# Patient Record
Sex: Female | Born: 1935
Health system: Southern US, Community
[De-identification: ages and names within clinical notes are randomized; demographics above are authoritative.]

## PROBLEM LIST (undated history)

## (undated) DIAGNOSIS — E039 Hypothyroidism, unspecified: Secondary | ICD-10-CM

## (undated) DIAGNOSIS — I10 Essential (primary) hypertension: Secondary | ICD-10-CM

## (undated) DIAGNOSIS — R7989 Other specified abnormal findings of blood chemistry: Secondary | ICD-10-CM

## (undated) DIAGNOSIS — M51369 Other intervertebral disc degeneration, lumbar region without mention of lumbar back pain or lower extremity pain: Secondary | ICD-10-CM

## (undated) DIAGNOSIS — J45909 Unspecified asthma, uncomplicated: Secondary | ICD-10-CM

## (undated) DIAGNOSIS — R079 Chest pain, unspecified: Secondary | ICD-10-CM

## (undated) DIAGNOSIS — M4800 Spinal stenosis, site unspecified: Secondary | ICD-10-CM

## (undated) DIAGNOSIS — M51379 Other intervertebral disc degeneration, lumbosacral region without mention of lumbar back pain or lower extremity pain: Secondary | ICD-10-CM

## (undated) DIAGNOSIS — R0600 Dyspnea, unspecified: Secondary | ICD-10-CM

## (undated) DIAGNOSIS — M1990 Unspecified osteoarthritis, unspecified site: Secondary | ICD-10-CM

## (undated) DIAGNOSIS — K760 Fatty (change of) liver, not elsewhere classified: Secondary | ICD-10-CM

## (undated) DIAGNOSIS — N644 Mastodynia: Secondary | ICD-10-CM

## (undated) DIAGNOSIS — R945 Abnormal results of liver function studies: Secondary | ICD-10-CM

## (undated) DIAGNOSIS — E756 Lipid storage disorder, unspecified: Secondary | ICD-10-CM

## (undated) DIAGNOSIS — M5137 Other intervertebral disc degeneration, lumbosacral region: Secondary | ICD-10-CM

## (undated) DIAGNOSIS — M5136 Other intervertebral disc degeneration, lumbar region: Secondary | ICD-10-CM

## (undated) DIAGNOSIS — Z86010 Personal history of colonic polyps: Secondary | ICD-10-CM

## (undated) DIAGNOSIS — R011 Cardiac murmur, unspecified: Secondary | ICD-10-CM

## (undated) DIAGNOSIS — K219 Gastro-esophageal reflux disease without esophagitis: Secondary | ICD-10-CM

## (undated) DIAGNOSIS — E8809 Other disorders of plasma-protein metabolism, not elsewhere classified: Secondary | ICD-10-CM

## (undated) DIAGNOSIS — E785 Hyperlipidemia, unspecified: Secondary | ICD-10-CM

## (undated) DIAGNOSIS — M8580 Other specified disorders of bone density and structure, unspecified site: Principal | ICD-10-CM

## (undated) HISTORY — DX: Mastodynia: N64.4

## (undated) HISTORY — DX: Other specified disorders of bone density and structure, unspecified site: M85.80

## (undated) HISTORY — PX: APPENDECTOMY: SHX54

## (undated) HISTORY — DX: Abnormal results of liver function studies: R94.5

## (undated) HISTORY — DX: Fatty (change of) liver, not elsewhere classified: K76.0

## (undated) HISTORY — DX: Unspecified asthma, uncomplicated: J45.909

## (undated) HISTORY — DX: Essential (primary) hypertension: I10

## (undated) HISTORY — PX: TONSILLECTOMY: SUR1361

## (undated) HISTORY — DX: Personal history of colonic polyps: Z86.010

## (undated) HISTORY — DX: Chest pain, unspecified: R07.9

## (undated) HISTORY — DX: Hypothyroidism, unspecified: E03.9

## (undated) HISTORY — DX: Other specified abnormal findings of blood chemistry: R79.89

## (undated) HISTORY — DX: Gastro-esophageal reflux disease without esophagitis: K21.9

## (undated) HISTORY — DX: Hyperlipidemia, unspecified: E78.5

## (undated) HISTORY — DX: Dyspnea, unspecified: R06.00

## (undated) HISTORY — DX: Unspecified osteoarthritis, unspecified site: M19.90

---

## 1966-05-12 HISTORY — PX: UTERINE SUSPENSION: SUR1430

## 1971-05-13 HISTORY — PX: CHOLECYSTECTOMY: SHX55

## 1998-02-15 ENCOUNTER — Ambulatory Visit (HOSPITAL_COMMUNITY): Admission: RE | Admit: 1998-02-15 | Discharge: 1998-02-15 | Payer: Self-pay | Admitting: Cardiology

## 1999-02-05 ENCOUNTER — Other Ambulatory Visit: Admission: RE | Admit: 1999-02-05 | Discharge: 1999-02-05 | Payer: Self-pay | Admitting: Gynecology

## 1999-09-11 ENCOUNTER — Ambulatory Visit (HOSPITAL_BASED_OUTPATIENT_CLINIC_OR_DEPARTMENT_OTHER): Admission: RE | Admit: 1999-09-11 | Discharge: 1999-09-11 | Payer: Self-pay | Admitting: Orthopedic Surgery

## 1999-09-11 ENCOUNTER — Encounter (INDEPENDENT_AMBULATORY_CARE_PROVIDER_SITE_OTHER): Payer: Self-pay | Admitting: *Deleted

## 1999-09-16 ENCOUNTER — Ambulatory Visit (HOSPITAL_COMMUNITY): Admission: RE | Admit: 1999-09-16 | Discharge: 1999-09-16 | Payer: Self-pay | Admitting: Orthopedic Surgery

## 1999-10-09 ENCOUNTER — Encounter: Payer: Self-pay | Admitting: Orthopedic Surgery

## 1999-10-09 ENCOUNTER — Ambulatory Visit (HOSPITAL_COMMUNITY): Admission: RE | Admit: 1999-10-09 | Discharge: 1999-10-09 | Payer: Self-pay | Admitting: Orthopedic Surgery

## 1999-10-21 ENCOUNTER — Encounter: Admission: RE | Admit: 1999-10-21 | Discharge: 1999-10-21 | Payer: Self-pay | Admitting: Internal Medicine

## 1999-10-30 ENCOUNTER — Encounter (INDEPENDENT_AMBULATORY_CARE_PROVIDER_SITE_OTHER): Payer: Self-pay | Admitting: *Deleted

## 1999-10-30 ENCOUNTER — Ambulatory Visit (HOSPITAL_BASED_OUTPATIENT_CLINIC_OR_DEPARTMENT_OTHER): Admission: RE | Admit: 1999-10-30 | Discharge: 1999-10-30 | Payer: Self-pay | Admitting: Orthopedic Surgery

## 1999-12-09 ENCOUNTER — Encounter: Admission: RE | Admit: 1999-12-09 | Discharge: 1999-12-09 | Payer: Self-pay | Admitting: Internal Medicine

## 2000-01-20 ENCOUNTER — Encounter: Admission: RE | Admit: 2000-01-20 | Discharge: 2000-01-20 | Payer: Self-pay | Admitting: Internal Medicine

## 2000-02-26 ENCOUNTER — Encounter: Admission: RE | Admit: 2000-02-26 | Discharge: 2000-02-26 | Payer: Self-pay | Admitting: Internal Medicine

## 2000-04-17 ENCOUNTER — Encounter: Admission: RE | Admit: 2000-04-17 | Discharge: 2000-04-17 | Payer: Self-pay | Admitting: Internal Medicine

## 2000-04-23 ENCOUNTER — Other Ambulatory Visit: Admission: RE | Admit: 2000-04-23 | Discharge: 2000-04-23 | Payer: Self-pay | Admitting: Gynecology

## 2000-04-24 ENCOUNTER — Encounter: Admission: RE | Admit: 2000-04-24 | Discharge: 2000-04-24 | Payer: Self-pay | Admitting: Internal Medicine

## 2000-06-01 ENCOUNTER — Other Ambulatory Visit: Admission: RE | Admit: 2000-06-01 | Discharge: 2000-06-01 | Payer: Self-pay | Admitting: Gynecology

## 2000-07-14 ENCOUNTER — Encounter: Admission: RE | Admit: 2000-07-14 | Discharge: 2000-07-14 | Payer: Self-pay | Admitting: Internal Medicine

## 2001-05-12 DIAGNOSIS — J45909 Unspecified asthma, uncomplicated: Secondary | ICD-10-CM

## 2001-05-12 HISTORY — DX: Unspecified asthma, uncomplicated: J45.909

## 2001-06-30 ENCOUNTER — Other Ambulatory Visit: Admission: RE | Admit: 2001-06-30 | Discharge: 2001-06-30 | Payer: Self-pay | Admitting: Gynecology

## 2001-12-31 ENCOUNTER — Encounter: Admission: RE | Admit: 2001-12-31 | Discharge: 2001-12-31 | Payer: Self-pay | Admitting: Internal Medicine

## 2001-12-31 ENCOUNTER — Encounter: Payer: Self-pay | Admitting: Internal Medicine

## 2002-01-12 ENCOUNTER — Encounter: Payer: Self-pay | Admitting: Internal Medicine

## 2002-01-12 ENCOUNTER — Ambulatory Visit (HOSPITAL_COMMUNITY): Admission: RE | Admit: 2002-01-12 | Discharge: 2002-01-12 | Payer: Self-pay | Admitting: Internal Medicine

## 2003-07-31 ENCOUNTER — Other Ambulatory Visit: Admission: RE | Admit: 2003-07-31 | Discharge: 2003-07-31 | Payer: Self-pay | Admitting: Gynecology

## 2003-08-02 ENCOUNTER — Encounter: Admission: RE | Admit: 2003-08-02 | Discharge: 2003-08-02 | Payer: Self-pay | Admitting: Gynecology

## 2003-09-14 ENCOUNTER — Ambulatory Visit (HOSPITAL_BASED_OUTPATIENT_CLINIC_OR_DEPARTMENT_OTHER): Admission: RE | Admit: 2003-09-14 | Discharge: 2003-09-14 | Payer: Self-pay | Admitting: Gynecology

## 2003-09-14 ENCOUNTER — Ambulatory Visit (HOSPITAL_COMMUNITY): Admission: RE | Admit: 2003-09-14 | Discharge: 2003-09-14 | Payer: Self-pay | Admitting: Gynecology

## 2003-09-14 ENCOUNTER — Encounter (INDEPENDENT_AMBULATORY_CARE_PROVIDER_SITE_OTHER): Payer: Self-pay | Admitting: *Deleted

## 2004-03-19 ENCOUNTER — Ambulatory Visit: Payer: Self-pay | Admitting: Internal Medicine

## 2004-07-29 ENCOUNTER — Ambulatory Visit: Payer: Self-pay | Admitting: Internal Medicine

## 2004-08-22 ENCOUNTER — Ambulatory Visit: Payer: Self-pay | Admitting: Internal Medicine

## 2004-08-29 ENCOUNTER — Ambulatory Visit: Payer: Self-pay | Admitting: Internal Medicine

## 2004-09-23 ENCOUNTER — Ambulatory Visit: Payer: Self-pay | Admitting: Internal Medicine

## 2004-12-10 ENCOUNTER — Other Ambulatory Visit: Admission: RE | Admit: 2004-12-10 | Discharge: 2004-12-10 | Payer: Self-pay | Admitting: Gynecology

## 2004-12-17 ENCOUNTER — Encounter: Admission: RE | Admit: 2004-12-17 | Discharge: 2004-12-17 | Payer: Self-pay | Admitting: Gynecology

## 2005-09-10 ENCOUNTER — Encounter: Payer: Self-pay | Admitting: Cardiology

## 2005-11-14 ENCOUNTER — Ambulatory Visit: Payer: Self-pay | Admitting: Internal Medicine

## 2005-11-24 ENCOUNTER — Ambulatory Visit: Payer: Self-pay | Admitting: Internal Medicine

## 2005-12-30 ENCOUNTER — Other Ambulatory Visit: Admission: RE | Admit: 2005-12-30 | Discharge: 2005-12-30 | Payer: Self-pay | Admitting: Gynecology

## 2005-12-31 ENCOUNTER — Encounter: Admission: RE | Admit: 2005-12-31 | Discharge: 2005-12-31 | Payer: Self-pay | Admitting: Gynecology

## 2006-07-21 ENCOUNTER — Ambulatory Visit: Payer: Self-pay | Admitting: Gastroenterology

## 2006-07-21 LAB — CONVERTED CEMR LAB
Eosinophils Relative: 3.9 % (ref 0.0–5.0)
HCT: 42.2 % (ref 36.0–46.0)
Hemoglobin: 14.4 g/dL (ref 12.0–15.0)
Lymphocytes Relative: 24.1 % (ref 12.0–46.0)
Monocytes Relative: 9.2 % (ref 3.0–11.0)
Platelets: 244 10*3/uL (ref 150–400)
WBC: 7.2 10*3/uL (ref 4.5–10.5)

## 2006-08-10 ENCOUNTER — Ambulatory Visit: Payer: Self-pay | Admitting: Internal Medicine

## 2006-08-18 ENCOUNTER — Ambulatory Visit: Payer: Self-pay | Admitting: Gastroenterology

## 2006-08-18 LAB — CONVERTED CEMR LAB
BUN: 17 mg/dL (ref 6–23)
CO2: 29 meq/L (ref 19–32)
Chloride: 108 meq/L (ref 96–112)
Creatinine, Ser: 0.8 mg/dL (ref 0.4–1.2)
GFR calc non Af Amer: 74 mL/min
Glucose, Bld: 88 mg/dL (ref 70–99)
T4, Total: 8.4 ug/dL (ref 5.0–12.5)

## 2006-08-21 ENCOUNTER — Encounter (INDEPENDENT_AMBULATORY_CARE_PROVIDER_SITE_OTHER): Payer: Self-pay | Admitting: Specialist

## 2006-08-21 ENCOUNTER — Ambulatory Visit: Payer: Self-pay | Admitting: Gastroenterology

## 2006-08-21 DIAGNOSIS — Z860100 Personal history of colon polyps, unspecified: Secondary | ICD-10-CM

## 2006-08-21 HISTORY — DX: Personal history of colon polyps, unspecified: Z86.0100

## 2006-08-21 HISTORY — DX: Personal history of colonic polyps: Z86.010

## 2006-12-30 ENCOUNTER — Telehealth (INDEPENDENT_AMBULATORY_CARE_PROVIDER_SITE_OTHER): Payer: Self-pay | Admitting: *Deleted

## 2007-01-05 ENCOUNTER — Ambulatory Visit: Payer: Self-pay | Admitting: Internal Medicine

## 2007-01-11 LAB — CONVERTED CEMR LAB
ALT: 18 units/L (ref 0–35)
AST: 21 units/L (ref 0–37)
Direct LDL: 159.8 mg/dL
HDL: 54 mg/dL (ref >39.0)
TSH: 0.26 microintl units/mL — ABNORMAL LOW (ref 0.35–5.50)
Total CHOL/HDL Ratio: 4.2
Triglycerides: 137 mg/dL (ref 0–149)
VLDL: 27 mg/dL (ref 0–40)

## 2007-01-12 ENCOUNTER — Encounter (INDEPENDENT_AMBULATORY_CARE_PROVIDER_SITE_OTHER): Payer: Self-pay | Admitting: *Deleted

## 2007-01-15 ENCOUNTER — Ambulatory Visit: Payer: Self-pay | Admitting: Internal Medicine

## 2007-01-15 DIAGNOSIS — R252 Cramp and spasm: Secondary | ICD-10-CM

## 2007-01-15 DIAGNOSIS — E039 Hypothyroidism, unspecified: Secondary | ICD-10-CM

## 2007-01-15 LAB — CONVERTED CEMR LAB: LDL Goal: 160 mg/dL

## 2007-03-16 ENCOUNTER — Encounter: Admission: RE | Admit: 2007-03-16 | Discharge: 2007-03-16 | Payer: Self-pay | Admitting: Gynecology

## 2007-03-18 ENCOUNTER — Telehealth (INDEPENDENT_AMBULATORY_CARE_PROVIDER_SITE_OTHER): Payer: Self-pay | Admitting: *Deleted

## 2007-05-20 ENCOUNTER — Telehealth (INDEPENDENT_AMBULATORY_CARE_PROVIDER_SITE_OTHER): Payer: Self-pay | Admitting: *Deleted

## 2007-05-26 ENCOUNTER — Telehealth (INDEPENDENT_AMBULATORY_CARE_PROVIDER_SITE_OTHER): Payer: Self-pay | Admitting: *Deleted

## 2007-10-07 ENCOUNTER — Telehealth (INDEPENDENT_AMBULATORY_CARE_PROVIDER_SITE_OTHER): Payer: Self-pay | Admitting: *Deleted

## 2007-10-07 ENCOUNTER — Ambulatory Visit: Payer: Self-pay | Admitting: Family Medicine

## 2007-10-07 DIAGNOSIS — J45909 Unspecified asthma, uncomplicated: Secondary | ICD-10-CM | POA: Insufficient documentation

## 2007-10-07 DIAGNOSIS — R609 Edema, unspecified: Secondary | ICD-10-CM | POA: Insufficient documentation

## 2007-10-07 LAB — CONVERTED CEMR LAB
Blood in Urine, dipstick: NEGATIVE
Nitrite: NEGATIVE
Specific Gravity, Urine: 1.02
pH: 5

## 2007-10-08 ENCOUNTER — Encounter: Payer: Self-pay | Admitting: Family Medicine

## 2007-10-12 ENCOUNTER — Ambulatory Visit: Payer: Self-pay | Admitting: Internal Medicine

## 2007-10-12 ENCOUNTER — Telehealth (INDEPENDENT_AMBULATORY_CARE_PROVIDER_SITE_OTHER): Payer: Self-pay | Admitting: *Deleted

## 2007-10-12 DIAGNOSIS — T887XXA Unspecified adverse effect of drug or medicament, initial encounter: Secondary | ICD-10-CM

## 2007-10-12 DIAGNOSIS — K219 Gastro-esophageal reflux disease without esophagitis: Secondary | ICD-10-CM | POA: Insufficient documentation

## 2007-10-12 LAB — CONVERTED CEMR LAB
Bilirubin Urine: NEGATIVE
Protein, U semiquant: NEGATIVE
Specific Gravity, Urine: 1.01
Urobilinogen, UA: 0.2

## 2007-10-14 ENCOUNTER — Telehealth (INDEPENDENT_AMBULATORY_CARE_PROVIDER_SITE_OTHER): Payer: Self-pay | Admitting: *Deleted

## 2008-02-09 ENCOUNTER — Ambulatory Visit: Payer: Self-pay | Admitting: Internal Medicine

## 2008-02-24 ENCOUNTER — Ambulatory Visit: Payer: Self-pay | Admitting: Gynecology

## 2008-02-29 ENCOUNTER — Telehealth (INDEPENDENT_AMBULATORY_CARE_PROVIDER_SITE_OTHER): Payer: Self-pay | Admitting: *Deleted

## 2008-03-13 ENCOUNTER — Telehealth (INDEPENDENT_AMBULATORY_CARE_PROVIDER_SITE_OTHER): Payer: Self-pay | Admitting: *Deleted

## 2008-04-27 ENCOUNTER — Encounter: Admission: RE | Admit: 2008-04-27 | Discharge: 2008-04-27 | Payer: Self-pay | Admitting: Internal Medicine

## 2008-09-08 ENCOUNTER — Encounter: Payer: Self-pay | Admitting: Internal Medicine

## 2008-09-26 ENCOUNTER — Encounter: Payer: Self-pay | Admitting: Internal Medicine

## 2008-09-29 ENCOUNTER — Ambulatory Visit: Payer: Self-pay | Admitting: Family Medicine

## 2008-09-29 ENCOUNTER — Encounter: Payer: Self-pay | Admitting: Internal Medicine

## 2008-09-29 LAB — CONVERTED CEMR LAB: RBC / HPF: NONE SEEN (ref <3)

## 2008-09-30 ENCOUNTER — Encounter: Payer: Self-pay | Admitting: Family Medicine

## 2008-10-02 ENCOUNTER — Telehealth (INDEPENDENT_AMBULATORY_CARE_PROVIDER_SITE_OTHER): Payer: Self-pay | Admitting: *Deleted

## 2008-10-03 ENCOUNTER — Telehealth (INDEPENDENT_AMBULATORY_CARE_PROVIDER_SITE_OTHER): Payer: Self-pay | Admitting: *Deleted

## 2008-10-05 ENCOUNTER — Encounter: Payer: Self-pay | Admitting: Internal Medicine

## 2008-10-30 ENCOUNTER — Encounter: Payer: Self-pay | Admitting: Gastroenterology

## 2009-01-04 ENCOUNTER — Ambulatory Visit: Payer: Self-pay | Admitting: Internal Medicine

## 2009-01-04 LAB — CONVERTED CEMR LAB
ALT: 16 units/L (ref 0–35)
AST: 21 units/L (ref 0–37)
Albumin: 3.7 g/dL (ref 3.5–5.2)
BUN: 16 mg/dL (ref 6–23)
Basophils Relative: 0.2 % (ref 0.0–3.0)
Bilirubin Urine: NEGATIVE
Blood in Urine, dipstick: NEGATIVE
Chloride: 106 meq/L (ref 96–112)
Glucose, Urine, Semiquant: NEGATIVE
HCT: 41.4 % (ref 36.0–46.0)
HDL: 56.1 mg/dL (ref >39.00)
Hemoglobin: 14.1 g/dL (ref 12.0–15.0)
LDL Cholesterol: 113 mg/dL — ABNORMAL HIGH (ref 0–99)
Lymphocytes Relative: 27.5 % (ref 12.0–46.0)
MCHC: 34.1 g/dL (ref 30.0–36.0)
Nitrite: NEGATIVE
Platelets: 186 10*3/uL (ref 150.0–400.0)
Potassium: 4.4 meq/L (ref 3.5–5.1)
Protein, U semiquant: NEGATIVE
TSH: 0.67 microintl units/mL (ref 0.35–5.50)
Urobilinogen, UA: 0.2
pH: 5

## 2009-01-05 ENCOUNTER — Encounter: Payer: Self-pay | Admitting: Internal Medicine

## 2009-01-11 ENCOUNTER — Ambulatory Visit: Payer: Self-pay | Admitting: Internal Medicine

## 2009-01-11 LAB — CONVERTED CEMR LAB
OCCULT 1: NEGATIVE
OCCULT 2: NEGATIVE

## 2009-01-12 ENCOUNTER — Ambulatory Visit: Payer: Self-pay | Admitting: Internal Medicine

## 2009-01-12 DIAGNOSIS — I10 Essential (primary) hypertension: Secondary | ICD-10-CM

## 2009-01-12 DIAGNOSIS — E782 Mixed hyperlipidemia: Secondary | ICD-10-CM

## 2009-01-12 DIAGNOSIS — Z86010 Personal history of colonic polyps: Secondary | ICD-10-CM

## 2009-01-12 DIAGNOSIS — Z85828 Personal history of other malignant neoplasm of skin: Secondary | ICD-10-CM

## 2009-01-16 ENCOUNTER — Telehealth (INDEPENDENT_AMBULATORY_CARE_PROVIDER_SITE_OTHER): Payer: Self-pay | Admitting: *Deleted

## 2009-05-17 ENCOUNTER — Telehealth: Payer: Self-pay | Admitting: Internal Medicine

## 2009-05-17 DIAGNOSIS — N644 Mastodynia: Secondary | ICD-10-CM

## 2009-05-21 ENCOUNTER — Encounter: Admission: RE | Admit: 2009-05-21 | Discharge: 2009-05-21 | Payer: Self-pay | Admitting: Internal Medicine

## 2009-07-19 ENCOUNTER — Encounter (INDEPENDENT_AMBULATORY_CARE_PROVIDER_SITE_OTHER): Payer: Self-pay | Admitting: *Deleted

## 2009-11-30 ENCOUNTER — Encounter: Payer: Self-pay | Admitting: Internal Medicine

## 2009-12-07 ENCOUNTER — Ambulatory Visit: Payer: Self-pay | Admitting: Internal Medicine

## 2009-12-07 LAB — CONVERTED CEMR LAB
Ketones, urine, test strip: NEGATIVE
Specific Gravity, Urine: 1.02

## 2009-12-08 ENCOUNTER — Encounter: Payer: Self-pay | Admitting: Internal Medicine

## 2009-12-11 ENCOUNTER — Encounter: Payer: Self-pay | Admitting: Internal Medicine

## 2009-12-11 ENCOUNTER — Telehealth (INDEPENDENT_AMBULATORY_CARE_PROVIDER_SITE_OTHER): Payer: Self-pay | Admitting: *Deleted

## 2009-12-11 LAB — CONVERTED CEMR LAB
ALT: 18 units/L (ref 0–35)
Eosinophils Absolute: 0.4 10*3/uL (ref 0.0–0.7)
HCT: 43.3 % (ref 36.0–46.0)
MCHC: 34.6 g/dL (ref 30.0–36.0)
MCV: 94.5 fL (ref 78.0–100.0)
Monocytes Absolute: 0.9 10*3/uL (ref 0.1–1.0)
Neutro Abs: 5.1 10*3/uL (ref 1.4–7.7)
Neutrophils Relative %: 57.6 % (ref 43.0–77.0)
RBC: 4.58 M/uL (ref 3.87–5.11)
RDW: 14.2 % (ref 11.5–14.6)
Total Bilirubin: 0.4 mg/dL (ref 0.3–1.2)
WBC: 8.9 10*3/uL (ref 4.5–10.5)

## 2009-12-12 ENCOUNTER — Ambulatory Visit: Payer: Self-pay | Admitting: Internal Medicine

## 2009-12-12 DIAGNOSIS — R748 Abnormal levels of other serum enzymes: Secondary | ICD-10-CM | POA: Insufficient documentation

## 2009-12-14 LAB — CONVERTED CEMR LAB: Amylase: 220 units/L — ABNORMAL HIGH (ref 27–131)

## 2009-12-18 ENCOUNTER — Encounter: Admission: RE | Admit: 2009-12-18 | Discharge: 2009-12-18 | Payer: Self-pay | Admitting: Internal Medicine

## 2009-12-20 ENCOUNTER — Ambulatory Visit: Payer: Self-pay | Admitting: Internal Medicine

## 2009-12-20 ENCOUNTER — Encounter (INDEPENDENT_AMBULATORY_CARE_PROVIDER_SITE_OTHER): Payer: Self-pay | Admitting: *Deleted

## 2010-04-10 ENCOUNTER — Ambulatory Visit: Payer: Self-pay | Admitting: Internal Medicine

## 2010-04-10 DIAGNOSIS — R350 Frequency of micturition: Secondary | ICD-10-CM

## 2010-04-10 DIAGNOSIS — M546 Pain in thoracic spine: Secondary | ICD-10-CM

## 2010-04-10 LAB — CONVERTED CEMR LAB
Glucose, Urine, Semiquant: NEGATIVE
Protein, U semiquant: NEGATIVE
Specific Gravity, Urine: 1.015
WBC Urine, dipstick: NEGATIVE

## 2010-04-11 ENCOUNTER — Ambulatory Visit: Payer: Self-pay | Admitting: Internal Medicine

## 2010-04-15 ENCOUNTER — Telehealth: Payer: Self-pay | Admitting: Internal Medicine

## 2010-05-16 ENCOUNTER — Ambulatory Visit
Admission: RE | Admit: 2010-05-16 | Discharge: 2010-05-16 | Payer: Self-pay | Source: Home / Self Care | Attending: Internal Medicine | Admitting: Internal Medicine

## 2010-05-16 ENCOUNTER — Encounter: Payer: Self-pay | Admitting: Internal Medicine

## 2010-05-16 LAB — CONVERTED CEMR LAB
Bilirubin Urine: NEGATIVE
Blood in Urine, dipstick: NEGATIVE
Nitrite: NEGATIVE
Specific Gravity, Urine: <1.005
Urobilinogen, UA: 0.2
pH: 6

## 2010-05-17 ENCOUNTER — Encounter: Payer: Self-pay | Admitting: Internal Medicine

## 2010-05-19 DIAGNOSIS — M949 Disorder of cartilage, unspecified: Secondary | ICD-10-CM

## 2010-05-19 DIAGNOSIS — M899 Disorder of bone, unspecified: Secondary | ICD-10-CM | POA: Insufficient documentation

## 2010-05-20 ENCOUNTER — Other Ambulatory Visit: Payer: Self-pay | Admitting: Internal Medicine

## 2010-05-20 ENCOUNTER — Encounter: Payer: Self-pay | Admitting: Internal Medicine

## 2010-05-20 ENCOUNTER — Telehealth (INDEPENDENT_AMBULATORY_CARE_PROVIDER_SITE_OTHER): Payer: Self-pay | Admitting: *Deleted

## 2010-05-20 ENCOUNTER — Ambulatory Visit
Admission: RE | Admit: 2010-05-20 | Discharge: 2010-05-20 | Payer: Self-pay | Source: Home / Self Care | Attending: Internal Medicine | Admitting: Internal Medicine

## 2010-05-20 DIAGNOSIS — R519 Headache, unspecified: Secondary | ICD-10-CM | POA: Insufficient documentation

## 2010-05-20 DIAGNOSIS — R6883 Chills (without fever): Secondary | ICD-10-CM | POA: Insufficient documentation

## 2010-05-20 DIAGNOSIS — R51 Headache: Secondary | ICD-10-CM | POA: Insufficient documentation

## 2010-05-21 LAB — CBC WITH DIFFERENTIAL/PLATELET
Basophils Absolute: 0 10*3/uL (ref 0.0–0.1)
Basophils Relative: 0.5 % (ref 0.0–3.0)
Eosinophils Absolute: 0.5 10*3/uL (ref 0.0–0.7)
Eosinophils Relative: 5.7 % — ABNORMAL HIGH (ref 0.0–5.0)
HCT: 43.5 % (ref 36.0–46.0)
Hemoglobin: 14.5 g/dL (ref 12.0–15.0)
Lymphocytes Relative: 21.6 % (ref 12.0–46.0)
Lymphs Abs: 2 10*3/uL (ref 0.7–4.0)
MCHC: 33.3 g/dL (ref 30.0–36.0)
MCV: 96.8 fl (ref 78.0–100.0)
Monocytes Absolute: 0.5 10*3/uL (ref 0.1–1.0)
Monocytes Relative: 6.1 % (ref 3.0–12.0)
Neutro Abs: 6 10*3/uL (ref 1.4–7.7)
Neutrophils Relative %: 66.1 % (ref 43.0–77.0)
Platelets: 240 10*3/uL (ref 150.0–400.0)
RBC: 4.49 Mil/uL (ref 3.87–5.11)
RDW: 13.9 % (ref 11.5–14.6)
WBC: 9 10*3/uL (ref 4.5–10.5)

## 2010-05-22 ENCOUNTER — Ambulatory Visit
Admission: RE | Admit: 2010-05-22 | Discharge: 2010-05-22 | Payer: Self-pay | Source: Home / Self Care | Attending: Women's Health | Admitting: Women's Health

## 2010-05-22 ENCOUNTER — Other Ambulatory Visit
Admission: RE | Admit: 2010-05-22 | Discharge: 2010-05-22 | Payer: Self-pay | Source: Home / Self Care | Admitting: Gynecology

## 2010-05-28 ENCOUNTER — Ambulatory Visit
Admission: RE | Admit: 2010-05-28 | Discharge: 2010-05-28 | Payer: Self-pay | Source: Home / Self Care | Attending: Internal Medicine | Admitting: Internal Medicine

## 2010-05-28 ENCOUNTER — Other Ambulatory Visit: Payer: Self-pay | Admitting: Internal Medicine

## 2010-05-28 LAB — HEPATIC FUNCTION PANEL
ALT: 19 U/L (ref 0–35)
AST: 22 U/L (ref 0–37)
Albumin: 3.8 g/dL (ref 3.5–5.2)
Alkaline Phosphatase: 73 U/L (ref 39–117)
Bilirubin, Direct: 0.1 mg/dL (ref 0.0–0.3)
Total Bilirubin: 0.5 mg/dL (ref 0.3–1.2)
Total Protein: 6.5 g/dL (ref 6.0–8.3)

## 2010-05-28 LAB — CBC WITH DIFFERENTIAL/PLATELET
Basophils Absolute: 0 10*3/uL (ref 0.0–0.1)
Basophils Relative: 0.5 % (ref 0.0–3.0)
Eosinophils Absolute: 0.4 10*3/uL (ref 0.0–0.7)
Eosinophils Relative: 4.8 % (ref 0.0–5.0)
HCT: 39.8 % (ref 36.0–46.0)
Hemoglobin: 13.6 g/dL (ref 12.0–15.0)
Lymphocytes Relative: 26.7 % (ref 12.0–46.0)
Lymphs Abs: 2.4 10*3/uL (ref 0.7–4.0)
MCHC: 34.3 g/dL (ref 30.0–36.0)
MCV: 95.2 fl (ref 78.0–100.0)
Monocytes Absolute: 0.6 10*3/uL (ref 0.1–1.0)
Monocytes Relative: 6.9 % (ref 3.0–12.0)
Neutro Abs: 5.6 10*3/uL (ref 1.4–7.7)
Neutrophils Relative %: 61.1 % (ref 43.0–77.0)
Platelets: 244 10*3/uL (ref 150.0–400.0)
RBC: 4.18 Mil/uL (ref 3.87–5.11)
RDW: 13.8 % (ref 11.5–14.6)
WBC: 9.2 10*3/uL (ref 4.5–10.5)

## 2010-05-28 LAB — BASIC METABOLIC PANEL
BUN: 16 mg/dL (ref 6–23)
CO2: 26 mEq/L (ref 19–32)
Calcium: 8.9 mg/dL (ref 8.4–10.5)
Chloride: 105 mEq/L (ref 96–112)
Creatinine, Ser: 1 mg/dL (ref 0.4–1.2)
GFR: 59.5 mL/min — ABNORMAL LOW (ref >60.00)
Glucose, Bld: 98 mg/dL (ref 70–99)
Potassium: 4.4 mEq/L (ref 3.5–5.1)
Sodium: 141 mEq/L (ref 135–145)

## 2010-05-28 LAB — LIPID PANEL
Cholesterol: 202 mg/dL — ABNORMAL HIGH (ref 0–200)
HDL: 62.1 mg/dL (ref >39.00)
Total CHOL/HDL Ratio: 3
Triglycerides: 144 mg/dL (ref 0.0–149.0)
VLDL: 28.8 mg/dL (ref 0.0–40.0)

## 2010-05-28 LAB — TSH: TSH: 2.34 u[IU]/mL (ref 0.35–5.50)

## 2010-05-28 LAB — LDL CHOLESTEROL, DIRECT: Direct LDL: 128 mg/dL

## 2010-05-31 ENCOUNTER — Telehealth (INDEPENDENT_AMBULATORY_CARE_PROVIDER_SITE_OTHER): Payer: Self-pay | Admitting: *Deleted

## 2010-06-02 ENCOUNTER — Encounter: Payer: Self-pay | Admitting: Gynecology

## 2010-06-11 NOTE — Letter (Signed)
Summary: Results Follow up Letter  [facility] [place]  [address] [place], [place] [address]   Phone: [phone]  Fax: [phone]    12/20/2009 MRN: [id number]  [facility] [place] [address] [place], [place]  [address]  Dear Ms. Patterson Hammersmith,  The following are the results of your recent test(s):  Test         Result    Pap Smear:        Normal _____  Not Normal _____ Comments: ______________________________________________________ Cholesterol: LDL(Bad cholesterol):         Your goal is less than:         HDL (Good cholesterol):       Your goal is more than: Comments:  ______________________________________________________ Mammogram:        Normal _____  Not Normal _____ Comments:  ___________________________________________________________________ Hemoccult:        Normal __X___  Not normal _______ Comments:    _____________________________________________________________________ Other Tests:    We routinely do not discuss normal results over the telephone.  If you desire a copy of the results, or you have any questions about this information we can discuss them at your next office visit.   Sincerely,

## 2010-06-11 NOTE — Assessment & Plan Note (Signed)
Summary: UTI, shingles shot, med refills//lch   Vital Signs:  Patient profile:   77 year old female Height:      59 inches Weight:      194.8 pounds BMI:     39.49 Temp:     98.1 degrees F oral Pulse rate:   72 / minute Resp:     16 per minute BP sitting:   118 / 76  (left arm) Cuff size:   large  Vitals Entered By: Shonna Chock CMA (April 10, 2010 4:04 PM) CC: ? UTI - frequent urination x 1 month and shingles vaccine, Dysuria, Back pain, COPD follow-up   CC:  ? UTI - frequent urination x 1 month and shingles vaccine, Dysuria, Back pain, and COPD follow-up.  History of Present Illness:      This is a 77 year old woman who presents with frequency X 1 month.  The patient reports urinary frequency and urgency, but denies burning with urination, hematuria, vaginal discharge, and vaginal itching.  The patient denies the following associated symptoms: nausea, vomiting, shaking chills, flank pain, abdominal pain, and pelvic pain.  The patient denies the following risk factors: diabetes, history of GU anomaly, and history of pyelonephritis.   Back Pain      The patient also presents with Back pain for > 3 months .  The dull, aching  pain is located in the left thoracic back.  The pain began gradually.  The pain  does not radiate .The pain is made worse by flexion.  The pain is made better by inactivity , sitting in a straight chair and NSAID medications.  Risk factors for serious underlying conditions include duration of pain > 1 month and age >= 50 years.   No evaluation to date. RAD  Follow-Up      The patient also presents for RAD  follow-up.  The patient reports wheezing and cough, but denies shortness of breath, increased sputum, heat intolerance, and nocturnal awakening.  The patient reports limitation of moderate activities.  Current treatment includes MDI without spacer.  Medication use includes quick relief med rarely and controller med daily, Asmanex.  Her company wants to change to  Flovent @ $330 for 3 months.  Current Medications (verified): 1)  Methscopolamine Bromide 2.5 Mg .... Take 1 Tablet By Mouth Every 6 Hrs As Needed 2)  Levoxyl 88 Mcg  Tabs (Levothyroxine Sodium) .[name].. 1 By Mouth Once Daily  Except 1/2 On Tues & Sat **appointment Due** 3)  Nexium 40 Mg  Cpdr (Esomeprazole Magnesium) .[name].. 1 By Mouth Qd 4)  Flovent Hfa 220 Mcg/act Aero (Fluticasone Propionate  Hfa) .[name].. 1-2 Puffs Every 12 Hours As Needed 5)  Singulair 10 Mg  Tabs (Montelukast Sodium) .[name].. 1 By Mouth Once Daily Prn 6)  Klonopin 0.5 Mg  Tabs (Clonazepam) .[name].. 1 Qhs 7)  Albuterol Sulfate 0.63 Mg/58ml  Nebu (Albuterol Sulfate) .[name].. 1 Amp Q 4 Hrs Prn 8)  Spironolactone 25 Mg  Tabs (Spironolactone) .[name].. 1 Once Daily 9)  Crestor 5 Mg Tabs (Rosuvastatin Calcium) .[name].. 1 By Mouth Once Daily **appointment Due**  Allergies: 1)  ! * Phenegan  Review of Systems Derm:  Denies lesion(s) and rash. Neuro:  Denies brief paralysis, numbness, tingling, and weakness.  Physical Exam  General:  in no acute distress; alert,appropriate and cooperative throughout examination Lungs:  Normal respiratory effort, chest expands symmetrically. Lungs are clear to auscultation, no crackles or wheezes. Heart:  Normal rate and regular rhythm. S1 and S2 normal  without gallop, murmur, click, rub .S4 Abdomen:  Bowel sounds positive,abdomen soft and non-tender without masses, organomegaly or hernias noted. Msk:  Tender to percussion mid thoracic spine. Neg SLR Extremities:  No clubbing, cyanosis, edema. Classic OA changes Neurologic:  alert & oriented X3, strength normal in all extremities, and DTRs symmetrical and normal.   Skin:  Intact without suspicious lesions or rashes Cervical Nodes:  No lymphadenopathy noted Axillary Nodes:  No palpable lymphadenopathy Psych:  memory intact for recent and remote, normally interactive, and good eye contact.     Impression & Recommendations:  Problem # 1:  BACK PAIN, THORACIC REGION  (ICD-724.1)  The following medications were removed from the medication list:    Tramadol Hcl 50 Mg Tabs (Tramadol hcl) .[name]... 1/2 -1 every 6 hrs as needed for pain    Cyclobenzaprine Hcl 5 Mg Tabs (Cyclobenzaprine hcl) .[name]... 1 two times a day & 1-2 at bedtime as needed for back pain  Orders: T-Thoracic Spine 2 Views [phone])  Problem # 2:  URINARY FREQUENCY (ICD-788.41)  The following medications were removed from the medication list:    Nitrofurantoin Monohyd Macro 100 Mg Caps (Nitrofurantoin monohyd macro) .[name]... 1 two times a day Her updated medication list for this problem includes:    Detrol La 2 Mg Xr24h-cap (Tolterodine tartrate) .[name]... 1 once daily for urinary symptoms  Problem # 3:  ASTHMA (ICD-493.90)  The following medications were removed from the medication list:    Flovent Hfa 220 Mcg/act Aero (Fluticasone propionate  hfa) .[name]... 1-2 puffs every 12 hours as needed Her updated medication list for this problem includes:    Singulair 10 Mg Tabs (Montelukast sodium) .[name]... 1 by mouth once daily prn    Albuterol Sulfate 0.63 Mg/63ml Nebu (Albuterol sulfate) .[name]... 1 amp q 4 hrs prn    Qvar 80 Mcg/act Aers (Beclomethasone dipropionate) .[name]... 1-2 puffs every 12 hrs ; gargle & spit after use  Complete Medication List: 1)  Methscopolamine Bromide 2.5 Mg  .... Take 1 tablet by mouth every 6 hrs as needed 2)  Levoxyl 88 Mcg Tabs (Levothyroxine sodium) .[name].. 1 by mouth once daily  except 1/2 on tues & sat **appointment due** 3)  Nexium 40 Mg Cpdr (Esomeprazole magnesium) .[name].. 1 by mouth qd 4)  Singulair 10 Mg Tabs (Montelukast sodium) .[name].. 1 by mouth once daily prn 5)  Klonopin 0.5 Mg Tabs (Clonazepam) .[name].. 1 qhs 6)  Albuterol Sulfate 0.63 Mg/84ml Nebu (Albuterol sulfate) .[name].. 1 amp q 4 hrs prn 7)  Spironolactone 25 Mg Tabs (Spironolactone) .[name].. 1 once daily 8)  Crestor 5 Mg Tabs (Rosuvastatin calcium) .[name].. 1 by mouth once daily **appointment due** 9)  Detrol La 2 Mg Xr24h-cap  (Tolterodine tartrate) .[name].. 1 once daily for urinary symptoms 10)  Qvar 80 Mcg/act Aers (Beclomethasone dipropionate) .[name].. 1-2 puffs every 12 hrs ; gargle & spit after use  Other Orders: Specimen Handling (63875) T-Culture, Urine ([id number]) UA Dipstick W/ Micro (manual) (81000) Zoster (Shingles) Vaccine Live (41660) Admin 1st Vaccine (63016)  Patient Instructions: 1)  Use QVAR sample 1-2 puffs every 12 hrs; gargle & spit after use. determine best option in class. Prescriptions: QVAR 80 MCG/ACT AERS (BECLOMETHASONE DIPROPIONATE) 1-2 puffs every 12 hrs ; gargle & spit after use  #1 x 0   Entered and Authorized by:   Marga Melnick MD   Signed by:   Marga Melnick MD on 04/10/2010   Method used:   Samples Given   RxID:   [id number] DETROL  LA 2 MG XR24H-CAP (TOLTERODINE TARTRATE) 1 once daily for urinary symptoms  #30 x 5   Entered and Authorized by:   Marga Melnick MD   Signed by:   Marga Melnick MD on 04/10/2010   Method used:   Print then Give to Patient   RxID:   [id number]    Orders Added: 1)  Specimen Handling [99000] 2)  T-Culture, Urine [[id number]] 3)  UA Dipstick W/ Micro (manual) [81000] 4)  Zoster (Shingles) Vaccine Live [90736] 5)  Admin 1st Vaccine [90471] 6)  T-Thoracic Spine 2 Views [72070TC] 7)  Est. Patient Level IV [52841]   Immunizations Administered:  Zostavax # 1:    Vaccine Type: Zostavax    Site: Left Arm    Mfr: Merck    Dose: 0.44mL    Route: [place]    Given by: Shonna Chock CMA    Exp. Date: 03/13/2011    Lot #: [id number]    VIS given: 02/21/05 given April 10, 2010.   Immunizations Administered:  Zostavax # 1:    Vaccine Type: Zostavax    Site: Left Arm    Mfr: Merck    Dose: 0.59mL    Route: [place]    Given by: Shonna Chock CMA    Exp. Date: 03/13/2011    Lot #: [id number]    VIS given: 02/21/05 given April 10, 2010.  Laboratory Results   Urine Tests   Date/Time Reported: April 10, 2010 4:08 PM   Routine  Urinalysis   Color: yellow Appearance: Clear Glucose: negative   (Normal Range: Negative) Bilirubin: negative   (Normal Range: Negative) Ketone: negative   (Normal Range: Negative) Spec. Gravity: 1.015   (Normal Range: 1.003-1.035) Blood: negative   (Normal Range: Negative) pH: 5.0   (Normal Range: 5.0-8.0) Protein: negative   (Normal Range: Negative) Urobilinogen: negative   (Normal Range: 0-1) Nitrite: negative   (Normal Range: Negative) Leukocyte Esterace: negative   (Normal Range: Negative)    Comments: Floydene Flock  April 10, 2010 4:09 PM cx sent

## 2010-06-11 NOTE — Medication Information (Signed)
Summary: Nebulizer/[facility]  Nebulizer/[facility]   Imported By: Lanelle Bal 12/17/2009 11:15:09  _____________________________________________________________________  External Attachment:    Type:   Image     Comment:   External Document

## 2010-06-11 NOTE — Assessment & Plan Note (Signed)
Summary: BACK PAIN./KB   Vital Signs:  Patient profile:   77 year old female Height:      58.75 inches Weight:      192 pounds Temp:     98.1 degrees F oral Pulse rate:   80 / minute Resp:     17 per minute BP sitting:   110 / 70  (left arm)  Vitals Entered By: Jeremy Johann CMA (December 12, 2009 1:49 PM) CC: lower back pain, recent UTI, Back pain   CC:  lower back pain, recent UTI, and Back pain.  History of Present Illness: Back Pain      This is a 77 year old woman who presents with Back pain , progressive since  07/27.  The patient reports chills  but not since 08/01. The dysuria is dramatically better; the urine C&S was normal.Pain is present  even @  rest pain. She  denies fever, weakness, loss of sensation, fecal incontinence, urinary incontinence, urinary retention, and inability to care for self.  The pain is located in the mid low back.  The pain began gradually and  w/o a trigger or injury. The pain radiates  superiorly on L.  The pain is made worse by  extension  and activity.  The pain is made better by inactivity and sitting or bending forward.  Risk factors for serious underlying conditions include age >= 50 years.  No PMH of back condition. Labs reviewed & copies provided ; amylase was elevated.No PMH of malignancy.  Current Medications (verified): 1)  Methscopolamine Bromide 2.5 Mg .... Take 1 Tablet By Mouth Every 6 Hrs As Needed 2)  Levoxyl 88 Mcg  Tabs (Levothyroxine Sodium) .[name].. 1 By Mouth Once Daily  Except 1/2 On Tues & Sat 3)  Nexium 40 Mg  Cpdr (Esomeprazole Magnesium) .[name].. 1 By Mouth Qd 4)  Flovent Hfa 220 Mcg/act Aero (Fluticasone Propionate  Hfa) .[name].. 1-2 Puffs Every 12 Hours As Needed 5)  Singulair 10 Mg  Tabs (Montelukast Sodium) .[name].. 1 By Mouth Once Daily Prn 6)  Klonopin 0.5 Mg  Tabs (Clonazepam) .[name].. 1 Qhs 7)  Albuterol Sulfate 0.63 Mg/15ml  Nebu (Albuterol Sulfate) .[name].. 1 Amp Q 4 Hrs Prn 8)  Spironolactone 25 Mg  Tabs (Spironolactone) .[name].. 1 Once  Daily 9)  Crestor 5 Mg Tabs (Rosuvastatin Calcium) .[name].. 1 By Mouth Once Daily 10)  Nitrofurantoin Monohyd Macro 100 Mg Caps (Nitrofurantoin Monohyd Macro) .[name].. 1 Two Times A Day  Allergies: 1)  ! * Phenegan  Past History:  Past Medical History: Asthma GERD Hypothyroidism Hyperlipidemia Hypertension Colonic polyps, hx of Decreased LV relaxation, LAE on 2D ECHO 09/2008, [facility] Skin cancer, PMH  of, Dr Jorja Loa  Past Surgical History: Hand Surgery for foreign body ; Uterine Suspension; G2 P2 Appendectomy Cholecystectomy Colon polypectomy X3 ,Dr Jarold Motto Tonsillectomy Uterine polyps  Review of Systems GI:  Denies bloody stools and dark tarry stools. GU:  Denies discharge and hematuria. Derm:  Denies lesion(s) and rash. Neuro:  Denies numbness, tingling, and weakness.  Physical Exam  General:  Uncomfortable but in no acute distress; alert,appropriate and cooperative throughout examination Eyes:  No icterus Abdomen:  Bowel sounds positive,abdomen soft  slightly tender epigastrium & RLQ  without masses, organomegaly or hernias noted. Msk:  Lay down & sat up w/o help Extremities:  No clubbing, cyanosis, edema. Mild DJD of hands . Neg SLR Neurologic:  alert & oriented X3, strength normal in all extremities, gait  slow & guarded  and  DTRs symmetrical and normal.   Skin:  Intact without suspicious lesions or rashes. No jaundice Psych:  memory intact for recent and remote, normally interactive, and good eye contact.     Impression & Recommendations:  Problem # 1:  LOW BACK PAIN, ACUTE (ICD-724.2)  Her updated medication list for this problem includes:    Tramadol Hcl 50 Mg Tabs (Tramadol hcl) .[name]... 1/2 -1 every 6 hrs as needed for pain    Cyclobenzaprine Hcl 5 Mg Tabs (Cyclobenzaprine hcl) .[name]... 1 two times a day & 1-2 at bedtime as needed for back pain  Orders: Prescription Created Electronically [phone])  Problem # 2:  OTHER NONSPECIFIC ABNORMAL SERUM ENZYME LEVELS  (ICD-790.5)  Amylase 679( < 131)  Orders: TLB-Amylase (82150-AMYL) TLB-Lipase (83690-LIPASE)  Problem # 3:  ABDOMINAL PAIN, EPIGASTRIC (ICD-789.06)  Orders: TLB-Amylase (82150-AMYL) TLB-Lipase (83690-LIPASE)  Complete Medication List: 1)  Methscopolamine Bromide 2.5 Mg  .... Take 1 tablet by mouth every 6 hrs as needed 2)  Levoxyl 88 Mcg Tabs (Levothyroxine sodium) .[name].. 1 by mouth once daily  except 1/2 on tues & sat 3)  Nexium 40 Mg Cpdr (Esomeprazole magnesium) .[name].. 1 by mouth qd 4)  Flovent Hfa 220 Mcg/act Aero (Fluticasone propionate  hfa) .[name].. 1-2 puffs every 12 hours as needed 5)  Singulair 10 Mg Tabs (Montelukast sodium) .[name].. 1 by mouth once daily prn 6)  Klonopin 0.5 Mg Tabs (Clonazepam) .[name].. 1 qhs 7)  Albuterol Sulfate 0.63 Mg/43ml Nebu (Albuterol sulfate) .[name].. 1 amp q 4 hrs prn 8)  Spironolactone 25 Mg Tabs (Spironolactone) .[name].. 1 once daily 9)  Crestor 5 Mg Tabs (Rosuvastatin calcium) .[name].. 1 by mouth once daily 10)  Nitrofurantoin Monohyd Macro 100 Mg Caps (Nitrofurantoin monohyd macro) .[name].. 1 two times a day 11)  Tramadol Hcl 50 Mg Tabs (Tramadol hcl) .... 1/2 -1 every 6 hrs as needed for pain 12)  Cyclobenzaprine Hcl 5 Mg Tabs (Cyclobenzaprine hcl) .[name].. 1 two times a day & 1-2 at bedtime as needed for back pain  Patient Instructions: 1)  Films of LS spine & Physical Therapy if no better. Prescriptions: CYCLOBENZAPRINE HCL 5 MG TABS (CYCLOBENZAPRINE HCL) 1 two times a day & 1-2 at bedtime as needed for back pain  #20 x 0   Entered and Authorized by:   Marga Melnick MD   Signed by:   Marga Melnick MD on 12/12/2009   Method used:   Faxed to ...       [facility] [phone]* (retail)       [address] [place], [place]  [address]       Ph: [phone]       Fax: [phone]   RxID:   [id number] TRAMADOL HCL 50 MG TABS (TRAMADOL HCL) 1/2 -1 every 6 hrs as needed for pain  #30 x 1   Entered and Authorized by:   Marga Melnick  MD   Signed by:   Marga Melnick MD on 12/12/2009   Method used:   Faxed to ...       [facility] [phone]* (retail)       [address] [place], [place]  [address]       Ph: [phone]       Fax: [phone]   RxID:   [id number]   Appended Document: BACK  PAIN.Evlyn Clines

## 2010-06-11 NOTE — Progress Notes (Signed)
Summary: Med refill  Phone Note Refill Request Message from:  Patient on April 15, 2010 2:41 PM  Refills Requested: Medication #1:  CRESTOR 5 MG TABS 1 by mouth once daily **APPOINTMENT DUE**  Medication #2:  LEVOXYL 88 MCG  TABS 1 by mouth once daily  except 1/2 on Tues & Sat **APPOINTMENT DUE** Patient was made aware in office to schedule CPX and labs.  Initial call taken by: Lucious Groves CMA,  April 15, 2010 2:41 PM    Prescriptions: CRESTOR 5 MG TABS (ROSUVASTATIN CALCIUM) 1 by mouth once daily **APPOINTMENT DUE**  #90 x 0   Entered by:   Lucious Groves CMA   Authorized by:   Marga Melnick MD   Signed by:   Lucious Groves CMA on 04/15/2010   Method used:   Electronically to        [id number]* (retail)       [address] [place], [place]  [address]       Ph: [phone]       Fax: [phone]   RxID:   [id number] LEVOXYL 88 MCG  TABS (LEVOTHYROXINE SODIUM) 1 by mouth once daily  except 1/2 on Tues & Sat **APPOINTMENT DUE**  #30 x 0   Entered by:   Lucious Groves CMA   Authorized by:   Marga Melnick MD   Signed by:   Lucious Groves CMA on 04/15/2010   Method used:   Electronically to        [facility]* (retail)       [address] [place], [place]  [address]       Ph: [phone]       Fax: [phone]   RxID:   [id number]

## 2010-06-11 NOTE — Progress Notes (Signed)
Summary: Left Breast Pain: Order for Mammogram(?)  Phone Note Call from Patient Call back at Home Phone [phone]   Caller: Patient Summary of Call: Messsage left on VM: Patient with pending appointment on Monday with the [facility]. Patient said we need to fax an order for a Diagnostic Mammogram. We can call the [facility] @ [phone] if we would like.   I called the Breast Center to see if they were aware as to why patient needs order for a Diagnostic mammogram instead of a regular mammogram. I was told the patient called the [facility] saying she has pain in Left Breast x 1 week in one area and that would require a Diagnostic mammogram.   Dr.Adib Wahba please advise, would you need to see patient first Re: pain in left breast or would you just order a Diagnostic mammogram?   .Shonna Chock  May 17, 2009 11:16 AM   Follow-up for Phone Call        Mastodynia (breast pain) Follow-up by: Marga Melnick MD,  May 17, 2009 1:04 PM  Additional Follow-up for Phone Call Additional follow up Details #1::        order faxed to breast center @ [phone], left pt detail message order faxed.Felecia Deloach CMA  May 17, 2009 2:26 PM   New Problems: MASTODYNIA (ICD-611.71)   New Problems: MASTODYNIA (ICD-611.71)

## 2010-06-11 NOTE — Letter (Signed)
Summary: Colonoscopy Letter  [facility]  [address] [place], [place] [address]   Phone: [phone]  Fax: [phone]      July 19, 2009 MRN: [id number]   [facility] [place] [address] [place], [place]  [address]   Dear Ms. Koerber,   According to your medical record, it is time for you to schedule a Colonoscopy. The American Cancer Society recommends this procedure as a method to detect early colon cancer. Patients with a family history of colon cancer, or a personal history of colon polyps or inflammatory bowel disease are at increased risk.  This letter has beeen generated based on the recommendations made at the time of your procedure. If you feel that in your particular situation this may no longer apply, please contact our office.  Please call our office at [phone] to schedule this appointment or to update your records at your earliest convenience.  Thank you for cooperating with [place] to provide you with the very best care possible.   Sincerely,   Vania Rea. Jarold Motto, M.D.  [facility] [phone]

## 2010-06-11 NOTE — Assessment & Plan Note (Signed)
Summary: Bladder Infection/scm   Vital Signs:  Patient profile:   77 year old female Weight:      194.4 pounds BMI:     39.74 Temp:     97.7 degrees F oral Pulse rate:   72 / minute Resp:     17 per minute BP sitting:   114 / 68  (left arm) Cuff size:   large  Vitals Entered By: Shonna Chock CMA (December 07, 2009 1:43 PM) CC: Bladder Infection, ? If Dr.Romney Compean will refill Pamine 2.5mg , Abdominal pain   CC:  Bladder Infection, ? If Dr.Chiyoko Torrico will refill Pamine 2.5mg , and Abdominal pain.  History of Present Illness: Onset 1 week ago as LBP pain & dysuria ; symptoms have progressed. Rx: none. PMH of recurrent UTIs. Recently she has had some minor epigastric discomfort. .  The patient reports nausea, but denies vomiting.    The pain is described as dull.  The patient denies the following symptoms: chest pain, jaundice, dark urine, and vaginal bleeding.  The pain is worse with direct pressure.  The pain is better with decaffeinated  iced tea or [employer] beverage. Triggers reviewed.  Current Medications (verified): 1)  Pamine 2.5 Mg Tabs (Methscopolamine Bromide) .... Take 1 Tablet By Mouth As Directed 2)  Levoxyl 88 Mcg  Tabs (Levothyroxine Sodium) .[name].. 1 By Mouth Once Daily  Except 1/2 On Tues & Sat 3)  Nexium 40 Mg  Cpdr (Esomeprazole Magnesium) .[name].. 1 By Mouth Qd 4)  Flovent Hfa 220 Mcg/act Aero (Fluticasone Propionate  Hfa) .[name].. 1-2 Puffs Every 12 Hours As Needed 5)  Singulair 10 Mg  Tabs (Montelukast Sodium) .[name].. 1 By Mouth Once Daily Prn 6)  Klonopin 0.5 Mg  Tabs (Clonazepam) .[name].. 1 Qhs 7)  Albuterol Sulfate 0.63 Mg/62ml  Nebu (Albuterol Sulfate) .[name].. 1 Amp Q 4 Hrs Prn 8)  Spironolactone 25 Mg  Tabs (Spironolactone) .[name].. 1 Once Daily 9)  Crestor 5 Mg Tabs (Rosuvastatin Calcium) .[name].. 1 By Mouth Once Daily  Allergies: 1)  ! * Phenegan  Review of Systems General:  Denies chills, fever, sweats, and weight loss. ENT:  Complains of hoarseness; denies difficulty swallowing. GI:   Denies bloody stools, dark tarry stools, and indigestion; Single episode of abd pain 07/26. On Nexium. Nausea in am. GU:  Denies discharge and hematuria.  Physical Exam  General:  in no acute distress; alert,appropriate and cooperative throughout examination Eyes:  No corneal or conjunctival inflammation noted.No icterus Mouth:  Oral mucosa and oropharynx without lesions or exudates.  No pharyngeal erythema.   Abdomen:  Bowel sounds positive,abdomen soft and NON -tender without masses, organomegaly or hernias noted. Msk:  No flank tenderness; neg SLR Skin:  Intact without suspicious lesions or rashes. No jaundice Cervical Nodes:  No lymphadenopathy noted Axillary Nodes:  No palpable lymphadenopathy   Impression & Recommendations:  Problem # 1:  UTI (ICD-599.0)  The following medications were removed from the medication list:    Amoxicillin 500 Mg Caps (Amoxicillin) .[name]... 1 three times a day Her updated medication list for this problem includes:    Nitrofurantoin Monohyd Macro 100 Mg Caps (Nitrofurantoin monohyd macro) .[name]... 1 two times a day  Orders: Specimen Handling (91478) T-Culture, Urine ([id number]) UA Dipstick w/o Micro (manual) (57846) Prescription Created Electronically [phone])  Problem # 2:  ABDOMINAL PAIN, EPIGASTRIC (ICD-789.06)  isolated episode  Orders: Venipuncture (28413) TLB-CBC Platelet - w/Differential (85025-CBCD) TLB-Amylase (82150-AMYL) TLB-Lipase (83690-LIPASE) TLB-Hepatic/Liver Function Pnl (80076-HEPATIC)  Complete Medication List: 1)  Methscopolamine Bromide 2.5  Mg  .... Take 1 tablet by mouth every 6 hrs as needed 2)  Levoxyl 88 Mcg Tabs (Levothyroxine sodium) .[name].. 1 by mouth once daily  except 1/2 on tues & sat 3)  Nexium 40 Mg Cpdr (Esomeprazole magnesium) .[name].. 1 by mouth qd 4)  Flovent Hfa 220 Mcg/act Aero (Fluticasone propionate  hfa) .[name].. 1-2 puffs every 12 hours as needed 5)  Singulair 10 Mg Tabs (Montelukast sodium) .[name].. 1 by mouth once  daily prn 6)  Klonopin 0.5 Mg Tabs (Clonazepam) .[name].. 1 qhs 7)  Albuterol Sulfate 0.63 Mg/50ml Nebu (Albuterol sulfate) .[name].. 1 amp q 4 hrs prn 8)  Spironolactone 25 Mg Tabs (Spironolactone) .[name].. 1 once daily 9)  Crestor 5 Mg Tabs (Rosuvastatin calcium) .[name].. 1 by mouth once daily 10)  Nitrofurantoin Monohyd Macro 100 Mg Caps (Nitrofurantoin monohyd macro) .[name].. 1 two times a day  Patient Instructions: 1)  Complete stool cards.Drink as much fluid as you can tolerate for the next few days.Avoid foods high in acid (tomatoes, citrus juices, spicy foods). Avoid eating within two hours of lying down or before exercising. Do not over eat; try smaller more frequent meals. Elevate head of bed twelve inches when sleeping. Prescriptions: METHSCOPOLAMINE BROMIDE 2.5 MG Take 1 tablet by mouth every 6 hrs as needed  #60 x 1   Entered and Authorized by:   Marga Melnick MD   Signed by:   Marga Melnick MD on 12/07/2009   Method used:   Faxed to ...       [facility] [phone]* (retail)       [address] [place], [place]  [address]       Ph: [phone]       Fax: [phone]   RxID:   [id number] NITROFURANTOIN MONOHYD MACRO 100 MG CAPS (NITROFURANTOIN MONOHYD MACRO) 1 two times a day  #20 x 0   Entered and Authorized by:   Marga Melnick MD   Signed by:   Marga Melnick MD on 12/07/2009   Method used:   Faxed to ...       [facility] [phone]* (retail)       [address] [place], [place]  [address]       Ph: [phone]       Fax: [phone]   RxID:   [id number]   Laboratory Results   Urine Tests   Date/Time Reported: December 07, 2009 1:19 PM   Routine Urinalysis   Color: yellow Appearance: Clear Glucose: negative   (Normal Range: Negative) Bilirubin: negative   (Normal Range: Negative) Ketone: negative   (Normal Range: Negative) Spec. Gravity: 1.020   (Normal Range: 1.003-1.035) Blood: trace-lysed   (Normal  Range: Negative) pH: 6.0   (Normal Range: 5.0-8.0) Protein: negative   (Normal Range: Negative) Urobilinogen: negative   (Normal Range: 0-1) Nitrite: negative   (Normal Range: Negative) Leukocyte Esterace: small   (Normal Range: Negative)    Comments: cx sent [facility]  December 07, 2009 1:20 PM      Appended Document: Bladder Infection/scm

## 2010-06-11 NOTE — Miscellaneous (Signed)
Summary: Orders Update   Clinical Lists Changes  Orders: Added new Referral order of Radiology Referral (Radiology) - Signed 

## 2010-06-11 NOTE — Progress Notes (Signed)
Summary: Lab Results  Phone Note Outgoing Call Call back at [facility] Phone [phone]   Call placed by: Shonna Chock CMA,  December 11, 2009 10:49 AM Call placed to: Patient Summary of Call: Spoke with patient re: lab results below, patient scheduled appointment to recheck labs on 12/21/09, copy of labs to be mailed  These tests are excellent except for the elevated amylase. It can be elevated with inflammation of the pancreas (it is also up with inflammation of the parotid gland in the cheek as with mumps), but the Lipase is more specific for pancreatitis & it is normal. An Ultrasound of the abdomen is appropriate due to location of pain & hugh amylase.repeat amylase & lipase in 2 weeks (789.00, 790.6). Levester Fresh CMA  December 11, 2009 10:49 AM

## 2010-06-13 NOTE — Assessment & Plan Note (Signed)
Summary: cpx///sph   Vital Signs:  Patient profile:   77 year old female Height:      59.75 inches Weight:      196.6 pounds BMI:     38.86 Temp:     97.4 degrees F oral Pulse rate:   80 / minute Resp:     14 per minute BP sitting:   124 / 78  (left arm) Cuff size:   large  Vitals Entered By: Shonna Chock CMA (May 16, 2010 2:19 PM) CC: 1.) CPX   2.) ? UTI and new med for muscle relaxer, insurance will not cover Flexeril (Generic) , Heartburn, Dysuria  Vision Screening:      Vision Comments: Patient states she had eye exam in June or July of 2011 and eyeglasses were changed    CC:  1.) CPX   2.) ? UTI and new med for muscle relaxer, insurance will not cover Flexeril (Generic) , Heartburn, and Dysuria.  History of Present Illness: Here for Medicare AWV: 1.Risk factors based on Past M, S, F history:see Diagnoses; chart updated 2.Physical Activities: no 3.Depression/mood: no issues 4.Hearing: whisper heard @ 6 ft 5.ADL's: no limitations 6.Fall Risk: no issues 7.Home Safety: safety proofed 8.Height, weight, &visual acuity:eye exam by Ophthalmologist 10/2009, lenses changed 9.Counseling: POA & Living Will in place 10.Labs ordered based on risk factors:se Orders  11.Referral Coordination: none requested 12.Care Plan: see Instructions 13. Cognitive Assessment: Oriented X 3 ; memory & recall intact  normal   ; " WORLD" spelled backwards; mood & affect normal. GERD F/U: The patient denies acid reflux, sour taste in mouth, epigastric pain, chest pain, and trouble swallowing.  The patient denies the following alarm features: melena, dysphagia, hematemesis, and vomiting.  Symptoms are worse with citrus.  The patient has found the following treatments to be effective: a PPI.   Hypertension F/U:  The patient reports urinary frequency, but denies lightheadedness, headaches, and fatigue.  Associated symptoms include dyspnea (due to asthma)  and leg edema.  The patient denies the  following associated symptoms: palpitations and syncope.  Adjunctive measures currently used by the patient include modified  salt restriction.   Hyperlipidemia :  The patient denies muscle aches, flushing, itching, constipation, and diarrhea.   Dysuria: The patient also presents with Dysuria X 10 days.  The patient reports burning with urination, urinary frequency, and urgency, but denies hematuria and vaginal discharge.  Associated symptoms include flank pain.  The patient denies the following associated symptoms: nausea, vomiting, fever, shaking chills, and pelvic pain.  History is significant for @ least  3 UTIs in one year.    Preventive Screening-Counseling & Management  Alcohol-Tobacco     Alcohol drinks/day: <1     Smoking Status: never  Caffeine-Diet-Exercise     Caffeine use/day: 1 cup  Hep-HIV-STD-Contraception     Dental Visit-last 6 months no     Dental Care Counseling: discussed     Sun Exposure-Excessive: no  Safety-Violence-Falls     Seat Belt Use: yes     Smoke Detectors: yes      Blood Transfusions:  no.        Travel History:  [place] 2000.    Current Medications (verified): 1)  Methscopolamine Bromide 2.5 Mg .... Take 1 Tablet By Mouth Every 6 Hrs As Needed 2)  Levoxyl 88 Mcg  Tabs (Levothyroxine Sodium) .[name].. 1 By Mouth Once Daily  Except 1/2 On Tues & Sat **appointment Due** 3)  Nexium 40 Mg  Cpdr (  Esomeprazole Magnesium) .[name].. 1 By Mouth Qd 4)  Singulair 10 Mg  Tabs (Montelukast Sodium) .[name].. 1 By Mouth Once Daily Prn 5)  Klonopin 0.5 Mg  Tabs (Clonazepam) .[name].. 1 Qhs 6)  Albuterol Sulfate 0.63 Mg/62ml  Nebu (Albuterol Sulfate) .[name].. 1 Amp Q 4 Hrs Prn 7)  Spironolactone 25 Mg  Tabs (Spironolactone) .[name].. 1 Once Daily 8)  Crestor 5 Mg Tabs (Rosuvastatin Calcium) .[name].. 1 By Mouth Once Daily **appointment Due** 9)  Detrol La 2 Mg Xr24h-Cap (Tolterodine Tartrate) .[name].. 1 Once Daily For Urinary Symptoms 10)  Qvar 80 Mcg/act Aers (Beclomethasone Dipropionate) .[name].. 1-2 Puffs  Every 12 Hrs ; Gargle & Spit After Use  Allergies: 1)  ! * Phenegan  Past History:  Past Medical History: Asthma GERD Hypothyroidism Hyperlipidemia Hypertension Colonic polyps, hx of Decreased LV relaxation, LAE on 2D ECHO 09/2008, [facility] Skin cancer, PMH  of, Dr Jorja Loa Osteopenia Prolonged Sleep Latency , no improvement with CPAP 2005  Past Surgical History: Hand Surgery for foreign body ; Uterine Suspension; G2 P2 Appendectomy Cholecystectomy Colon polypectomy @ multiple  survelliance colonoscopies ,Dr Jarold Motto Tonsillectomy Uterine polyps  Family History: Father:HTN,CAD,prostate  cancer Mother: HTN,stomach cancer, asthma Siblings:3 sisters: HTN, mastectomy ; bro: died @ 57 , poliobro: prostate, lung & pancreatic cancer ; bro: colon cancer; ZOX:WRUEAVWU cancer, alcohol abuse; son: HTN; daughter : thyroid disease  Social History: Married Never Smoked Alcohol use-yes: socially Regular exercise-no Caffeine use/day:  1 cup Dental Care w/in 6 mos.:  no Sun Exposure-Excessive:  no Seat Belt Use:  yes Blood Transfusions:  no  Physical Exam  General:  well-nourished,in no acute distress; alert,appropriate and cooperative throughout examination;overweight-appearing.   Head:  Normocephalic and atraumatic without obvious abnormalities. Eyes:  No corneal or conjunctival inflammation noted.Perrla. Funduscopic exam benign, without hemorrhages, exudates or papilledema. Ears:  External ear exam shows no significant lesions or deformities.  Otoscopic examination reveals clear canals, tympanic membranes are intact bilaterally without bulging, retraction, inflammation or discharge. Hearing is grossly normal bilaterally. Nose:  External nasal examination shows no deformity or inflammation. Nasal mucosa are pink and moist without lesions or exudates. Mouth:  Oral mucosa and oropharynx without lesions or exudates.  Teeth in good repair. Neck:  No deformities, masses, or  tenderness noted. Lungs:  Normal respiratory effort, chest expands symmetrically. Lungs are clear to auscultation, no crackles or wheezes (Note: asthma, PMH of). Heart:  Normal rate and regular rhythm. S1 and S2 normal without gallop, murmur, click, rub . S4 with slurring Abdomen:  Bowel sounds positive,abdomen soft and non-tender without masses, organomegaly or hernias noted. Genitalia:  Dr Audie Box Msk:  Lordosis upper spine. Lay down & sat upw/o help. Neg SLR Pulses:  R and L carotid,radial,dorsalis pedis and posterior tibial pulses are full and equal bilaterally Extremities:  No clubbing, cyanosis, edema. OA finger changes; knee crepitus L > R Neurologic:  alert & oriented X3, strength normal in all extremities, and DTRs symmetrical and normal.   Skin:  Intact without suspicious lesions or rashes Cervical Nodes:  No lymphadenopathy noted Axillary Nodes:  No palpable lymphadenopathy Psych:  memory intact for recent and remote, normally interactive, and good eye contact.     Impression & Recommendations:  Problem # 1:  PREVENTIVE HEALTH CARE (ICD-V70.0)  Orders: Medicare -1st Annual Wellness Visit [phone])  Problem # 2:  DYSURIA (ICD-788.1)  Her updated medication list for this problem includes:    Detrol La 2 Mg Xr24h-cap (Tolterodine tartrate) .[name]... 1 once daily for urinary symptoms  Macrobid 100 Mg Caps (Nitrofurantoin monohyd macro) .[name]... 1 by mouth two times a day  Problem # 3:  HYPERTENSION (ICD-401.9)  Her updated medication list for this problem includes:    Spironolactone 25 Mg Tabs (Spironolactone) .[name]... 1 once daily  Orders: EKG w/ Interpretation (93000)  Problem # 4:  HYPERLIPIDEMIA (ICD-272.4)  Her updated medication list for this problem includes:    Crestor 5 Mg Tabs (Rosuvastatin calcium) .[name]... 1 by mouth once daily  Problem # 5:  HYPOTHYROIDISM (ICD-244.9)  Her updated medication list for this problem includes:    Levoxyl 88 Mcg Tabs (Levothyroxine  sodium) .[name]... 1 by mouth once daily  except 1/2 on tues & sat   Problem # 6:  GERD (ICD-530.81)  Her updated medication list for this problem includes:    Nexium 40 Mg Cpdr (Esomeprazole magnesium) .[name]... 1 by mouth qd  Complete Medication List: 1)  Methscopolamine Bromide 2.5 Mg  .... Take 1 tablet by mouth every 6 hrs as needed 2)  Levoxyl 88 Mcg Tabs (Levothyroxine sodium) .[name].. 1 by mouth once daily  except 1/2 on tues & sat **appointment due** 3)  Nexium 40 Mg Cpdr (Esomeprazole magnesium) .[name].. 1 by mouth qd 4)  Singulair 10 Mg Tabs (Montelukast sodium) .[name].. 1 by mouth once daily prn 5)  Klonopin 0.5 Mg Tabs (Clonazepam) .[name].. 1 qhs 6)  Albuterol Sulfate 0.63 Mg/57ml Nebu (Albuterol sulfate) .[name].. 1 amp q 4 hrs prn 7)  Spironolactone 25 Mg Tabs (Spironolactone) .[name].. 1 once daily 8)  Crestor 5 Mg Tabs (Rosuvastatin calcium) .[name].. 1 by mouth once daily **appointment due** 9)  Detrol La 2 Mg Xr24h-cap (Tolterodine tartrate) .[name].. 1 once daily for urinary symptoms 10)  Qvar 80 Mcg/act Aers (Beclomethasone dipropionate) .[name].. 1-2 puffs every 12 hrs ; gargle & spit after use 11)  Macrobid 100 Mg Caps (Nitrofurantoin monohyd macro) .[name].. 1 by mouth two times a day  Other Orders: Specimen Handling (16109) T-Culture, Urine ([id number]) UA Dipstick w/o Micro (manual) (91478)  Patient Instructions: 1)  Schedule fasting labs; see Diagnoses for Codes: 2)  BMP ; 3)  Hepatic Panel ; 4)  Lipid Panel ; 5)  TSH ; 6)  CBC w/ Diff . Ninety day supply of meds  will be sent to [facility] after lab review. Prescriptions: MACROBID 100 MG CAPS (NITROFURANTOIN MONOHYD MACRO) 1 by mouth two times a day  #14 x 0   Entered by:   Shonna Chock CMA   Authorized by:   Marga Melnick MD   Signed by:   Shonna Chock CMA on 05/16/2010   Method used:   Electronically to        [id number]* (retail)       [address] [place], [place]  [address]       Ph: [phone]        Fax: [phone]   RxID:   [id number]    Orders Added: 1)  Specimen Handling [99000] 2)  T-Culture, Urine [[id number]] 3)  UA Dipstick w/o Micro (manual) [81002] 4)  Est. Patient 65& > [99397] 5)  EKG w/ Interpretation [93000] 6)  Medicare -1st Annual Wellness Visit [G0438] 7)  Est. Patient Level III [87564]    Laboratory Results   Urine Tests    Routine Urinalysis   Color: yellow Appearance: Clear Glucose: negative   (Normal Range: Negative) Bilirubin: negative   (Normal Range: Negative) Ketone:  negative   (Normal Range: Negative) Spec. Gravity: <1.005   (Normal Range: 1.003-1.035) Blood: negative   (Normal Range: Negative) pH: 6.0   (Normal Range: 5.0-8.0) Protein: negative   (Normal Range: Negative) Urobilinogen: 0.2   (Normal Range: 0-1) Nitrite: negative   (Normal Range: Negative) Leukocyte Esterace: moderate   (Normal Range: Negative)    Comments: sent for culture

## 2010-06-13 NOTE — Progress Notes (Signed)
Summary: Refill Request  Phone Note Refill Request Call back at Home Phone [phone] Message from:  Patient on May 31, 2010 10:39 AM  Refills Requested: Medication #1:  SINGULAIR 10 MG  TABS 1 by mouth once daily prn   Dosage confirmed as above?Dosage Confirmed   Supply Requested: 3 months  Medication #2:  KLONOPIN 0.5 MG  TABS 1 qhs   Dosage confirmed as above?Dosage Confirmed   Supply Requested: 3 months  Medication #3:  CRESTOR 5 MG TABS 1 by mouth once daily **APPOINTMENT DUE**   Dosage confirmed as above?Dosage Confirmed   Supply Requested: 3 months  Medication #4:  NEXIUM 40 MG  CPDR 1 by mouth qd   Dosage confirmed as above?Dosage Confirmed   Supply Requested: 3 months fexofinadine 180 mg, levoxyl 88 mg to be [facility] for 3 month supply  Initial call taken by: Freddy Jaksch,  May 31, 2010 10:39 AM    New/Updated Medications: LEVOXYL 88 MCG  TABS (LEVOTHYROXINE SODIUM) 1 by mouth once daily  except 1/2 on Tues & Sat CRESTOR 5 MG TABS (ROSUVASTATIN CALCIUM) 1 by mouth once daily FEXOFENADINE HCL 180 MG TABS (FEXOFENADINE HCL) 1 by mouth once daily as needed Prescriptions: FEXOFENADINE HCL 180 MG TABS (FEXOFENADINE HCL) 1 by mouth once daily as needed  #90 x 1   Entered by:   Shonna Chock CMA   Authorized by:   Marga Melnick MD   Signed by:   Shonna Chock CMA on 05/31/2010   Method used:   Printed then faxed to ...       [facility] [phone]* (retail)       [address] [place], [place]  [address]       Ph: [phone]       Fax: [phone]   RxID:   [id number] CRESTOR 5 MG TABS (ROSUVASTATIN CALCIUM) 1 by mouth once daily  #90 x 3   Entered by:   Shonna Chock CMA   Authorized by:   Marga Melnick MD   Signed by:   Shonna Chock CMA on 05/31/2010   Method used:   Printed then faxed to ...       [facility] [phone]* (retail)       [address] [place], [place]   [address]       Ph: [phone]       Fax: [phone]   RxID:   [id number] KLONOPIN 0.5 MG  TABS (CLONAZEPAM) 1 qhs  #90 x 1   Entered by:   Shonna Chock CMA   Authorized by:   Marga Melnick MD   Signed by:   Shonna Chock CMA on 05/31/2010   Method used:   Printed then faxed to ...       [facility] [phone]* (retail)       [address] [place], [place]  [address]       Ph: [phone]       Fax: [phone]   RxID:   [id number] LEVOXYL 88 MCG  TABS (LEVOTHYROXINE SODIUM) 1 by mouth once daily  except 1/2 on Tues & Sat **APPOINTMENT DUE**  #90 x 3   Entered by:   Shonna Chock CMA   Authorized by:  Marga Melnick MD   Signed by:   Shonna Chock CMA on 05/31/2010   Method used:   Printed then faxed to ...       [facility] [phone]* (retail)       [address] [place], [place]  [address]       Ph: [phone]       Fax: [phone]   RxID:   [id number] SPIRONOLACTONE 25 MG  TABS (SPIRONOLACTONE) 1 once daily  #90 Tablet x 3   Entered by:   Shonna Chock CMA   Authorized by:   Marga Melnick MD   Signed by:   Shonna Chock CMA on 05/31/2010   Method used:   Printed then faxed to ...       [facility] [phone]* (retail)       [address] [place], [place]  [address]       Ph: [phone]       Fax: [phone]   RxID:   [id number] SINGULAIR 10 MG  TABS (MONTELUKAST SODIUM) 1 by mouth once daily prn  #90 x 3   Entered by:   Shonna Chock CMA   Authorized by:   Marga Melnick MD   Signed by:   Shonna Chock CMA on 05/31/2010   Method used:   Printed then faxed to ...       [facility] [phone]* (retail)       [address] [place], [place]  [address]       Ph: [phone]       Fax: [phone]   RxID:   [id number] NEXIUM 40 MG  CPDR (ESOMEPRAZOLE MAGNESIUM) 1 by mouth qd  #90 x 3   Entered by:   Shonna Chock  CMA   Authorized by:   Marga Melnick MD   Signed by:   Shonna Chock CMA on 05/31/2010   Method used:   Printed then faxed to ...       [facility] [phone]* (retail)       [address] [place], [place]  [address]       Ph: [phone]       Fax: [phone]   RxID:   [id number]

## 2010-06-13 NOTE — Assessment & Plan Note (Signed)
Summary: Patient sick was sch for bloodwork/nta   Vital Signs:  Patient profile:   77 year old female Weight:      196 pounds BMI:     38.74 Temp:     98.0 degrees F oral Pulse rate:   72 / minute Resp:     15 per minute BP sitting:   128 / 76  (left arm) Cuff size:   large  Vitals Entered By: Shonna Chock CMA (May 20, 2010 2:05 PM) CC: Not feeling well: dizzy, chills, decrease in appetitie and headache. Patient still with burning when urinating-? vaginal infection/irritation, URI symptoms   CC:  Not feeling well: dizzy, chills, decrease in appetitie and headache. Patient still with burning when urinating-? vaginal infection/irritation, and URI symptoms.  History of Present Illness:      This is a 77 year old woman who presents with  acute chills & bitemporal headache 01/06- 11/2009  The patient  now denies nasal congestion, purulent nasal discharge, productive cough, and earache.  The patient denies fever, dyspnea, wheezing, rash, vomiting, and diarrhea.   Risk factors for Strep sinusitis include tooth pain.  The patient denies the following risk factors for Strep sinusitis: unilateral facial pain and tender adenopathy.  She had Flu shot. Urine C&S was negative last week.  Current Medications (verified): 1)  Methscopolamine Bromide 2.5 Mg .... Take 1 Tablet By Mouth Every 6 Hrs As Needed 2)  Levoxyl 88 Mcg  Tabs (Levothyroxine Sodium) .[name].. 1 By Mouth Once Daily  Except 1/2 On Tues & Sat **appointment Due** 3)  Nexium 40 Mg  Cpdr (Esomeprazole Magnesium) .[name].. 1 By Mouth Qd 4)  Singulair 10 Mg  Tabs (Montelukast Sodium) .[name].. 1 By Mouth Once Daily Prn 5)  Klonopin 0.5 Mg  Tabs (Clonazepam) .[name].. 1 Qhs 6)  Albuterol Sulfate 0.63 Mg/19ml  Nebu (Albuterol Sulfate) .[name].. 1 Amp Q 4 Hrs Prn 7)  Spironolactone 25 Mg  Tabs (Spironolactone) .[name].. 1 Once Daily 8)  Crestor 5 Mg Tabs (Rosuvastatin Calcium) .[name].. 1 By Mouth Once Daily **appointment Due** 9)  Detrol La 2 Mg Xr24h-Cap (Tolterodine  Tartrate) .[name].. 1 Once Daily For Urinary Symptoms 10)  Qvar 80 Mcg/act Aers (Beclomethasone Dipropionate) .[name].. 1-2 Puffs Every 12 Hrs ; Gargle & Spit After Use 11)  Macrobid 100 Mg Caps (Nitrofurantoin Monohyd Macro) .[name].. 1 By Mouth Two Times A Day  Allergies: 1)  ! * Phenegan  Review of Systems GU:  Complains of dysuria; denies discharge and hematuria; No vaginal discharge.  Physical Exam  General:  in no acute distress; alert,appropriate and cooperative throughout examination Eyes:  No corneal or conjunctival inflammation noted.  Perrla. Ears:  External ear exam shows no significant lesions or deformities.  Otoscopic examination reveals clear canals, tympanic membranes are intact bilaterally without bulging, retraction, inflammation or discharge. Hearing is grossly normal bilaterally. Nose:  External nasal examination shows no deformity or inflammation. Nasal mucosa are pink and moist without lesions or exudates. Mouth:  Oral mucosa and oropharynx without lesions or exudates.  Teeth in good repair. Neck:  Supple w/o meningismus Lungs:  Normal respiratory effort, chest expands symmetrically. Lungs are clear to auscultation, no crackles or wheezes. Heart:  Normal rate and regular rhythm. S1 and S2 normal without gallop, murmur, click, rub or other extra sounds. Abdomen:  Bowel sounds positive,abdomen soft and non-tender without masses, organomegaly or hernias noted. Extremities:  Neg SLR Neurologic:  alert & oriented X3.   Skin:  Intact without suspicious lesions or  rashes Cervical Nodes:  No lymphadenopathy noted Axillary Nodes:  No palpable lymphadenopathy   Impression & Recommendations:  Problem # 1:  CHILLS WITHOUT FEVER (ICD-780.64)  Orders: Venipuncture (16109) TLB-CBC Platelet - w/Differential (85025-CBCD) T-Culture, Urine (60454-09811)  Problem # 2:  HEADACHE (ICD-784.0) Assessment: Unchanged  bitemporal   Orders: Venipuncture (91478)  Problem # 3:  DYSURIA  (ICD-788.1)  Her updated medication list for this problem includes:    Detrol La 2 Mg Xr24h-cap (Tolterodine tartrate) .[name]... 1 once daily for urinary symptoms    Macrobid 100 Mg Caps (Nitrofurantoin monohyd macro) .[name]... 1 by mouth two times a day  Orders: Venipuncture (29562) T-Culture, Urine ([id number])  Complete Medication List: 1)  Methscopolamine Bromide 2.5 Mg  .... Take 1 tablet by mouth every 6 hrs as needed 2)  Levoxyl 88 Mcg Tabs (Levothyroxine sodium) .[name].. 1 by mouth once daily  except 1/2 on tues & sat **appointment due** 3)  Nexium 40 Mg Cpdr (Esomeprazole magnesium) .[name].. 1 by mouth qd 4)  Singulair 10 Mg Tabs (Montelukast sodium) .[name].. 1 by mouth once daily prn 5)  Klonopin 0.5 Mg Tabs (Clonazepam) .[name].. 1 qhs 6)  Albuterol Sulfate 0.63 Mg/80ml Nebu (Albuterol sulfate) .[name].. 1 amp q 4 hrs prn 7)  Spironolactone 25 Mg Tabs (Spironolactone) .[name].. 1 once daily 8)  Crestor 5 Mg Tabs (Rosuvastatin calcium) .[name].. 1 by mouth once daily **appointment due** 9)  Detrol La 2 Mg Xr24h-cap (Tolterodine tartrate) .[name].. 1 once daily for urinary symptoms 10)  Qvar 80 Mcg/act Aers (Beclomethasone dipropionate) .[name].. 1-2 puffs every 12 hrs ; gargle & spit after use 11)  Macrobid 100 Mg Caps (Nitrofurantoin monohyd macro) .[name].. 1 by mouth two times a day  Patient Instructions: 1)  Hold Nitrofurantoin . 2)  Drink clear liquids only for the next 24 hours, then slowly add other liquids and food as you  tolerate them.   Orders Added: 1)  Est. Patient Level III [96295] 2)  Venipuncture [28413] 3)  TLB-CBC Platelet - w/Differential [85025-CBCD] 4)  T-Culture, Urine [[id number]]  Appended Document: Patient sick was sch for bloodwork/nta

## 2010-06-13 NOTE — Progress Notes (Signed)
Summary: Culture Results  Phone Note Outgoing Call Call back at [facility] Phone [phone]   Call placed by: Shonna Chock CMA,  May 20, 2010 11:55 AM Call placed to: Patient Details for Reason: Culture Results Summary of Call: Spoke with patient: No infection present ; I recommend Urology referral if symptoms persist or progress. Hopp  **Patient with pending appointment today to futher discuss./[facility] CMA  May 20, 2010 11:58 AM

## 2010-09-27 NOTE — Op Note (Signed)
[place]. [facility]  Patient:    LATRISH, MOGEL                      MRN: [id number] Proc. Date: 10/30/99 Adm. Date:  [id number] Disc. Date: [id number] Attending:  Marlowe Shores CC:         Artist Pais Mina Marble, M.D. (2)                           Operative Report  PREOPERATIVE DIAGNOSIS:  Masses x 3 left hand.  POSTOPERATIVE DIAGNOSIS:  Masses x 3 left hand.  PROCEDURE:  Excisional biopsy and tissue culturing of three masses, left hand, between the thumb and index web space and dorsally over the index finger metacarpal.  SURGEON:  Artist Pais. Mina Marble, M.D.  ANESTHESIA:  General.  TOURNIQUET TIME:  30 minutes.  COMPLICATIONS:  None.  DRAINS:  None.  SPECIMENS:  Three tissue specimens sent for tissue culture, fungal, AFB, etc., as per the ID service.  DESCRIPTION OF PROCEDURE:  The patient was taken to the operating room, where after the induction of adequate general anesthesia, the left upper extremity was prepped and draped in the usual sterile fashion.  The arm was elevated for five minutes, and the tourniquet was then inflated to 250 mmHg.  At this point in time, three lesions that were present in the left hand in the web space between the thumb and index finger were approached with longitudinal incisions.  The radialmost incision, which was measured 1 x 1 cm, was elliptically excised and dissected down to the underlying first dorsal interosseous muscle.  This was excised in its entirety and sent for tissue culturing as per the ID service.  This wound was then thoroughly irrigated.  A small lesion approximately 2 cm from the thumb in the ulnar direction was also incised and dissected free from underlying tissues and sent as a specimen. Finally a third incision was made over the metacarpal of the index finger proximal to the MP joint through a 2 cm incision, and this was also at this point in time carefully dissected free from underlying  tissues, and the specimen was also sent, for a total of three specimens.  The wounds were then thoroughly irrigated, hemostasis was achieved with bipolar cautery, and then the wounds were closed with 4-0 nylon in a combination of simple and horizontal mattress sutures.  A sterile dressing with Xeroform, 4 x 4s, fluffs, and a compressive dressing was applied.  The patient tolerated the procedure well, went to recovery in stable fashion. DD:  10/30/99 TD:  11/01/99 Job: [id number]

## 2010-09-27 NOTE — Op Note (Signed)
NAME:  Mary Roth, Mary Roth                         ACCOUNT NO.:  [id number]   MEDICAL RECORD NO.:  [id number]                   PATIENT TYPE:  AMB   LOCATION:  [facility]                                 FACILITY:  [facility]   PHYSICIAN:  Ivor Costa. Farrel Gobble, M.D.              DATE OF BIRTH:  Mar 08, 1929   DATE OF PROCEDURE:  09/14/2003  DATE OF DISCHARGE:                                 OPERATIVE REPORT   PREOPERATIVE DIAGNOSIS:  1. Postmenopausal female with thickened endometrium.  2. Positive family history of gynecologic cancer.   POSTOPERATIVE DIAGNOSIS:  1. Postmenopausal female with thickened endometrium.  2. Positive family history of gynecologic cancer.   PROCEDURE:  Dilatation and curettage, hysteroscopy.   SURGEON:  Ivor Costa. Farrel Gobble, M.D.   ANESTHESIA:  General with a paracervical block, no deficit, with 3% sorbitol  solution with 65 cc.   ESTIMATED BLOOD LOSS:  Minimal.   FINDINGS:  Anteverted uterus, sounding 8 cm.  Posterior wall fibroid.  No  gross cavity defects.   HISTOLOGY:  Endometrial curettings.   COMPLICATIONS:  None.   PROCEDURE:  The patient was taken to the operating room.  General anesthesia  was induced.  Placed in the dorsal lithotomy position.  Laminaria placed the  night before was removed and then prepped and draped in the usual sterile  fashion.  A bimanual exam was performed.  The orientation of the uterus was  confirmed.  A bivalve speculum was then placed in the vagina.  The cervix  was stabilized with a single-tooth tenaculum.  Dilute Pitressin solution was  injected for about 8 cc.  The cervix was noted to be grossly dilated.  It  sounded to 8.  The cervix was gently advanced to 15 [place], after which an  operative hysteroscope was advanced through the cervix into the cavity.  The  cavity was grossly normal with the exception of a small posterior wall  fibroid.  Sharp curettings were taken with the resectoscope from all four  walls of the fundus.  Then  gentle curettings following.  Placement of the  hysteroscope confirmed that we had a well-established biopsy and hemostat  cavity.  The instruments were then removed.  The patient tolerated the  procedure well.  Sponge, lap, and needle counts were correct x2.  She was  transferred to the PACU in stable condition.                                               Ivor Costa. Farrel Gobble, M.D.    Leda Roys  D:  09/14/2003  T:  09/14/2003  Job:  [id number]

## 2010-09-27 NOTE — Op Note (Signed)
[place]. [facility]  Patient:    Mary Roth, Mary Roth                      MRN: [id number] Proc. Date: 09/11/99 Adm. Date:  [id number] Attending:  Marlowe Shores CC:         Artist Pais Weingold, M.D. x 2                           Operative Report  PREOPERATIVE DIAGNOSIS:  Mass web space left hand extending from index finger.  POSTOPERATIVE DIAGNOSIS:  Mass web space left hand extending from index finger.  PROCEDURE:  Exploration, irrigation and debridement and culturing and excision of inclusion cyst left hand web space between thumb and index finger.  SURGEON:  Artist Pais. Mina Marble, M.D.  ANESTHESIA:  Bier block.  TOURNIQUET TIME:  30 minutes.  COMPLICATIONS:  None.  DRAINS:  One drain - left.  CULTURES:  x 2 taken.  OPERATIVE REPORT:  Patient was taken to the operating room and after the induction of adequate Bier block analgesia and IV sedation, the left upper extremity was prepped and draped in usual sterile fashion.  Once this was done, a bruenner type incision was made over the web space between the thumb and index finger dorsally and flaps were elevated both medially and laterally. Once this was done, a large cystic mass was encountered between the thumb musculature and the first dorsal interosseus muscle.  The cyst was somewhat necrotic and there was a small amount of pus.  This was cultured and the cyst was carefully excised.  There was a significant amount of scarring and necrotic fat surrounding the musculature and around the thumb and also extensor mechanism overlying the index finger.  This was all carefully excised.  The wound was then thoroughly irrigated with a liter of normal saline.  A vessel loop was then placed deep in the wound for a drain and the wound was then closed with 4-0 nylon in a combination of simple and horizontal mattress sutures.  A sterile dressing of Xeroform, 4 x 4, fluffs and a compressive hand dressing was  applied.  The patient tolerated the procedure well and went to recovery room in stable fashion. DD:  09/11/99 TD:  09/12/99 Job: [id number]

## 2010-09-27 NOTE — Assessment & Plan Note (Signed)
[facility]                         GASTROENTEROLOGY OFFICE NOTE   Mary Roth, Mary Roth                      MRN:          [id number]  DATE:07/21/2006                            DOB:          [birth year]    Mary Roth is a 77 year old white female who comes to my office  complaining of lower abdominal discomfort for the last several weeks.   Mary Roth has a long history of diverticulosis and irritable bowel  syndrome, with Mary Roth last colonoscopy exam in October of 2002. At that  time Mary Roth did have some small polyps also removed. Mary Roth additionally has a  history of colon cancer in Mary Roth mother in Mary Roth early 90s. For the last few  weeks Mary Roth has had a dull discomfort motion in Mary Roth left lower quadrant  without any real change in Mary Roth bowel habits. Mary Roth saw Dr. Audie Box and  had a negative GYN exam and was sent for CT scan of the abdomen which  was done at [facility] and Mary Roth had some sigmoid colon  diverticulosis and a very small umbilical hernia with some small  parapelvic renal cyst. There was no evidence of acute diverticulitis.  Pelvic ultrasound also was unremarkable with the left ovary not being  identified.   Mary Roth denies fevers, chills, melena, hematochezia, nausea, vomiting,  or systemic complaints, or any genitourinary problems. Mary Roth also denies  any upper GI or hepatobiliary complaints.   PAST MEDICAL HISTORY:  Remarkable for chronic thyroid dysfunction,  asthmatic bronchitis, degenerative arthritis, previous cholecystectomy,  and appendectomy.   MEDICATIONS:  1. Levoxyl 88 mcg a day.  2. Nexium 40 mg a day for acid reflux.  3. Asmanex daily.  4. P.R.N. Singulair use.   FAMILY HISTORY:  Remarkable for mother with colon cancer and 3 sisters  with breast cancer. Mary Roth father had prostate cancer and arthrosclerotic  cardiovascular disease. There is some possibility that Mary Roth mother also  had gynecologic carcinoma.   SOCIAL HISTORY:   Patient is married and lives with Mary Roth husband. 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 Mary Roth does not smoke and uses  ethanol socially.   REVIEW OF SYSTEMS:  Noncontributory without current cardiovascular,  pulmonary, genitourinary, neurologic, or psychiatric difficulties.   Exam shows Mary Roth to be a healthy-appearing white female in no distress,  appearing Mary Roth stated age. Mary Roth is only 4 feet 11 inches tall and weighs  198 pounds. Blood pressure is 142/66 and pulse was 66 and regular. Could  not appreciate stigmata of chronic liver disease.  CHEST: Clear and Mary Roth appeared to be in a regular rhythm without murmurs,  gallops, or rubs. I could not appreciate hepatosplenomegaly, abdominal  masses, but there was rather marked tenderness without rebound in the  left lower quadrant area. Bowel sounds were normal.  RECTAL EXAM: Unremarkable, without masses, or tenderness. There was soft  stool present, it was guaiac negative.   ASSESSMENT:  I am concerned the Mary Roth has subacute diverticulitis  with the amount of tenderness Mary Roth has in Mary Roth left lower quadrant area.  Mary Roth had previous CT scan which did not show evidence of  an abscess but  this was done on June 05, 2006.   RECOMMENDATIONS:  1. Cipro 500 mg twice a day for 10 days with office followup in 2      weeks time.  2. Pamine 2 mg after breakfast and twice a day as needed.  3. Low fiber diet.  4. Check CBC and sed rate.  5. Gastrointestinal follow up in 2 weeks' time. Consider followup      colonoscopy for history of polyps in Mary Roth family history and Mary Roth new      onset of gastrointestinal problems.     Vania Rea. Jarold Motto, MD, Caleen Essex, FAGA  Electronically Signed    DRP/MedQ  DD: 07/21/2006  DT: 07/21/2006  Job #: [id number]   cc:   Marcial Pacas P. Fontaine, M.D.  Titus Dubin. Alwyn Ren, MD,FACP,FCCP

## 2010-09-27 NOTE — H&P (Signed)
NAME:  Mary Roth, Mary Roth                         ACCOUNT NO.:  [id number]   MEDICAL RECORD NO.:  [id number]                   PATIENT TYPE:  AMB   LOCATION:  [facility]                                 FACILITY[facility]  [facility]   PHYSICIAN:  Ivor Costa. Farrel Gobble, M.D.              DATE OF BIRTH:  05-Jun-1928   DATE OF ADMISSION:  DATE OF DISCHARGE:                                HISTORY & PHYSICAL   CHIEF COMPLAINT:  Thickened endometrium with a necrogenic focus.   HISTORY OF PRESENT ILLNESS:  The patient is a 77 year old, G2, P2,  postmenopausal woman who is not on hormone replacement.  She had a baseline  ultrasound done for a family history of both breast and ovarian cancer.  Her  mother had uterine cancer.  Three of her sisters have had breast cancer-two  of them have died from it.  Her three sisters had all had hysterectomy.  Therefore, the only family history of uterine cancer is from her mother.  Based on that history, the patient presented for a baseline ultrasound.  She  has not had any postmenopausal bleeding.   Her ultrasound shows a normal uterus that is remarkable only for deviation  to the right, endometrium is 9.6 mm with a round hypoechoic focus that  measures 9 by 8 mm.  The ovaries were not identified and there was no free  fluid.  The ultrasound findings have been discussed with the patient, and  although she has not had any postmenopausal bleeding because of her family  history, the patient would like to proceed with a D&C hysteroscopy.  She was  offered but declined a less aggressive endometrial biopsy.   PAST OBSTETRICAL/GYNECOLOGIC HISTORY:  She is menopausal since 1986.  She  has had no postmenopausal bleeding.  She had two children delivered  vaginally without complication.  She has had normal Pap smears.  Normal  mammograms and does breast self-examination.   PAST MEDICAL HISTORY:  Significant for asthma and hypothyroidism.   MEDICATIONS:  Pulmicort and Synthroid.   PAST  SURGICAL HISTORY:  She had a tonsillectomy as a child.   SOCIAL HISTORY:  She is married.  No alcohol or tobacco. She drinks caffeine  and she exercises.   FAMILY HISTORY:  As mentioned above.  Her father and brother also had  prostate cancer.   ALLERGIES:  Negative.   PHYSICAL EXAMINATION:  GENERAL:  She is an elderly female consistent with  her staged age in no acute distress.  HEART:  Regular rate.  LUNGS:  Clear to auscultation.  ABDOMEN:  Soft and nontender.  GYNECOLOGIC:  She had postmenopausal changes to external genitalia.  The BUS  was negative.  The vagina was pale and smooth.  The cervix was also pale and  smooth.  The uterus was small and mobile.  The adnexae without fullness.  Rectovaginal exam was confirmatory.   ASSESSMENT:  Postmenopausal woman with thickened endometrium with  an  echogenic focus with a strong family history with both uterine and breast  cancer for a dilatation and curettage hysteroscopy.  The patient had a  Laminaria preoperatively to aid in dilation.  The risks and benefits of the  procedure were discussed and accepted.  She will present to the operating  room for the above-procedure.                                               Ivor Costa. Farrel Gobble, M.D.    Leda Roys  D:  09/13/2003  T:  09/13/2003  Job:  [id number]

## 2010-11-25 ENCOUNTER — Telehealth: Payer: Self-pay | Admitting: *Deleted

## 2010-11-25 NOTE — Telephone Encounter (Signed)
Pt due for recall colonoscopy, but due to age pt needs office visit. Number busy

## 2010-12-06 NOTE — Telephone Encounter (Signed)
Left message on machine that she is due for colonoscopy but needs ov to discuss and I have asked for her to call back. We will mail pt a letter as a reminder.

## 2010-12-09 ENCOUNTER — Encounter: Payer: Self-pay | Admitting: Gastroenterology

## 2010-12-23 ENCOUNTER — Other Ambulatory Visit: Payer: Self-pay | Admitting: Internal Medicine

## 2011-01-06 ENCOUNTER — Ambulatory Visit (INDEPENDENT_AMBULATORY_CARE_PROVIDER_SITE_OTHER): Payer: PRIVATE HEALTH INSURANCE | Admitting: Internal Medicine

## 2011-01-06 ENCOUNTER — Encounter: Payer: Self-pay | Admitting: Internal Medicine

## 2011-01-06 VITALS — BP 126/80 | HR 104 | Temp 97.9°F | Wt 198.2 lb

## 2011-01-06 DIAGNOSIS — J45901 Unspecified asthma with (acute) exacerbation: Secondary | ICD-10-CM

## 2011-01-06 MED ORDER — AZITHROMYCIN 250 MG PO TABS: ORAL_TABLET | ORAL | Status: AC

## 2011-01-06 MED ORDER — ALBUTEROL SULFATE HFA 108 (90 BASE) MCG/ACT IN AERS: 2.0000 | INHALATION_SPRAY | Freq: Four times a day (QID) | RESPIRATORY_TRACT | Status: DC | PRN

## 2011-01-06 MED ORDER — ALBUTEROL SULFATE 0.63 MG/3ML IN NEBU: 1.0000 | INHALATION_SOLUTION | RESPIRATORY_TRACT | Status: DC | PRN

## 2011-01-06 MED ORDER — FLUTICASONE-SALMETEROL 250-50 MCG/DOSE IN AEPB: 1.0000 | INHALATION_SPRAY | Freq: Two times a day (BID) | RESPIRATORY_TRACT | Status: DC

## 2011-01-06 MED ORDER — PREDNISONE 20 MG PO TABS: 20.0000 mg | ORAL_TABLET | Freq: Two times a day (BID) | ORAL | Status: AC

## 2011-01-06 NOTE — Patient Instructions (Signed)
If your asthma is not controlled, you can have serious heart irregularities, angina or even heart attack. If the asthma persists despite this aggressive program, to the emergency room. Please make a followup appointment for Friday 8/31. Cancel colonoscopy until well.

## 2011-01-06 NOTE — Progress Notes (Signed)
  Subjective:    Patient ID: Mary Roth, female    DOB: 1928/10/07, 77 y.o.   MRN: [id number]  HPI Asthma : Onset: 8/19 after 2 days of exposure to cabin fireplace Progression: 8/19 to presence Cough:slightly Sputum production:green sputum Dyspnea (rest/exertional/PND):even @ rest Wheezing:yes Chest pain, edema, palpitations:no Treatment/efficacy:Allegra in am; Singulair @ night. She ran out of Asmanex last Spring 2012; no Nebulizer meds since 5/12 Use of rescue inhaler:none on hand , only used Neb albuteral Use of maintenance inhaler: see note above Smoking:never Past medical history: Asthma:since 2002. No seasonal allergies . No  emphysema Family history pulmonary disease: sister & her mother : asthma   Review of Systems The major and minor symptoms of rhinosinusitis were reviewed. She denies nasal congestion/obstruction; nasal purulence; facial pain; anosmia; fever; headache; halitosis; earache or  dental pain. She has had some fatigue..      Objective:   Physical Exam General appearance is of good health and nourishment; no acute distress or increased work of breathing is present but upper airway musical wheezing heard @ 3 feet.  No  lymphadenopathy about the head, neck, or axilla noted.   Eyes: No conjunctival inflammation or lid edema is present.   Ears:  External ear exam shows no significant lesions or deformities.  Otoscopic examination reveals clear canals, tympanic membranes are intact bilaterally without bulging, retraction, inflammation or discharge.  Nose:  External nasal examination shows no deformity or inflammation. Nasal mucosa are dry  without lesions or exudates. Minor septal dislocation.No obstruction to airflow.   Oral exam: Dental hygiene is good; lips and gums are healthy appearing.There is no oropharyngeal erythema or exudate noted.   Neck:  No deformities, thyromegaly, masses, or tenderness noted.   No NVD.   Heart:  Mild tachycardia  But regular  rhythm. S1 and S2 normal without gallop, murmur, click, rub or other extra  sounds.    Lungs:Chest surprisingly  clear to auscultation; no  rhonchi,rales ,or rubs present.No significant  increased work of breathing.   Wheezing localized over upper airway only. No stridor to hyperventilation  Abdomen is soft and nontender with no organomegaly, hernias  or masses.  No HJR Extremities:  No cyanosis, edema, or clubbing  noted . DJD hand changes. Lipedema of ankles.  Homan's negative    Skin: Warm & dry w/o jaundice or tenting.          Assessment & Plan:  #1 asthma, acute exacerbation following exposure to fireplace. She does not have rescue agents or maintenance agents at this time. She's had a suboptimal response to antihistamine and a leukotriene blocker.  Plan: See orders and recommendations.  Note, she did not come in earlier as the symptoms have been intermittent. She questioned the prednisone and aggressive treatment. I explained the very serious potential riskS  if the bronchospasm was not controlled well.

## 2011-01-07 ENCOUNTER — Telehealth: Payer: Self-pay | Admitting: Gastroenterology

## 2011-01-07 ENCOUNTER — Ambulatory Visit: Payer: Self-pay | Admitting: Gastroenterology

## 2011-01-07 NOTE — Telephone Encounter (Signed)
Pt advised to cx appt by Dr Alwyn Ren due to asthma she is on a lot of new meds and zpack and prednisone, Please do not bill, per Dr Jarold Motto

## 2011-01-09 ENCOUNTER — Encounter: Payer: Self-pay | Admitting: Gastroenterology

## 2011-01-10 ENCOUNTER — Ambulatory Visit (INDEPENDENT_AMBULATORY_CARE_PROVIDER_SITE_OTHER): Payer: PRIVATE HEALTH INSURANCE | Admitting: Internal Medicine

## 2011-01-10 DIAGNOSIS — Z8249 Family history of ischemic heart disease and other diseases of the circulatory system: Secondary | ICD-10-CM

## 2011-01-10 DIAGNOSIS — E785 Hyperlipidemia, unspecified: Secondary | ICD-10-CM

## 2011-01-10 DIAGNOSIS — R5383 Other fatigue: Secondary | ICD-10-CM

## 2011-01-10 DIAGNOSIS — R0600 Dyspnea, unspecified: Secondary | ICD-10-CM

## 2011-01-10 DIAGNOSIS — M79609 Pain in unspecified limb: Secondary | ICD-10-CM

## 2011-01-10 DIAGNOSIS — M79602 Pain in left arm: Secondary | ICD-10-CM

## 2011-01-10 DIAGNOSIS — R0609 Other forms of dyspnea: Secondary | ICD-10-CM

## 2011-01-10 LAB — TSH: TSH: 2.05 u[IU]/mL (ref 0.35–5.50)

## 2011-01-10 LAB — CBC WITH DIFFERENTIAL/PLATELET
Basophils Absolute: 0 10*3/uL (ref 0.0–0.1)
Eosinophils Relative: 0.1 % (ref 0.0–5.0)
HCT: 42.9 % (ref 36.0–46.0)
Lymphocytes Relative: 11.2 % — ABNORMAL LOW (ref 12.0–46.0)
Monocytes Relative: 6.5 % (ref 3.0–12.0)
Neutrophils Relative %: 82 % — ABNORMAL HIGH (ref 43.0–77.0)
Platelets: 226 10*3/uL (ref 150.0–400.0)
WBC: 15.1 10*3/uL — ABNORMAL HIGH (ref 4.5–10.5)

## 2011-01-10 LAB — BASIC METABOLIC PANEL
BUN: 23 mg/dL (ref 6–23)
Calcium: 9.1 mg/dL (ref 8.4–10.5)
Creatinine, Ser: 0.8 mg/dL (ref 0.4–1.2)
GFR: 69.3 mL/min (ref >60.00)
Potassium: 5 mEq/L (ref 3.5–5.1)

## 2011-01-10 LAB — LIPID PANEL
Cholesterol: 215 mg/dL — ABNORMAL HIGH (ref 0–200)
HDL: 91.8 mg/dL (ref >39.00)
VLDL: 32.6 mg/dL (ref 0.0–40.0)

## 2011-01-10 NOTE — Progress Notes (Signed)
  Subjective:    Patient ID: Mary Roth, female    DOB: 1928-08-25, 77 y.o.   MRN: [id number]  HPI "I believe I have a heart blockage. I was having shortness of breath before the asthma flared."  Her shortness of breath and wheezing have improved significantly. She denies fever, chills, or purulent secretions. She has chronic night sweats. That has been a chronic issue rather than related to the asthma flare.  She still has some exertional dyspnea. She has not had to use her rescue inhaler recently. She denies paroxysmal nocturnal dyspnea. She denies exertional chest pain.  Both parents had coronary disease.    Review of Systems she has noted some aching discomfort left upper extremity mainly at night. She also has significant fatigue.     Objective:   Physical Exam she is in no acute distress.  Chest is clear to auscultation without rales, rhonchi, or wheezes  She has an S4 without significant murmur or gallop.  All pulses are intact and she has no bruits. She exhibits no edema, clubbing, or cyanosis.  Homans sign is negative.  Abdomen is nontender; she has no organomegaly or masses  There is no neck vein distention at 15; she has no hepatojugular reflux.  Deep tendon reflexes are normal and equal.  She has significant osteoarthritic changes of her hands. Mild crepitus is noted at the left shoulder with range of motion.       Assessment & Plan:  #1 asthma exacerbation, dramatically improved  #2 pre-existing exertional dyspnea  #3 fatigue   #4 left upper extremity discomfort  Plan: See orders and recommendations.  EKG reveals no ischemic changes. There is low voltage in the precordial leads due to  her body habitus/breast tissue.   Cardiology referral will be pursued as her concerns are valid with the fatigue and dyspnea possibly representing an anginal variant.

## 2011-01-14 ENCOUNTER — Encounter: Payer: Self-pay | Admitting: *Deleted

## 2011-02-13 ENCOUNTER — Other Ambulatory Visit: Payer: Self-pay | Admitting: Internal Medicine

## 2011-02-28 ENCOUNTER — Encounter: Payer: Self-pay | Admitting: Internal Medicine

## 2011-03-13 ENCOUNTER — Other Ambulatory Visit: Payer: Self-pay | Admitting: Internal Medicine

## 2011-03-13 DIAGNOSIS — Z1231 Encounter for screening mammogram for malignant neoplasm of breast: Secondary | ICD-10-CM

## 2011-03-13 LAB — HM MAMMOGRAPHY: HM Mammogram: NEGATIVE

## 2011-04-04 ENCOUNTER — Ambulatory Visit
Admission: RE | Admit: 2011-04-04 | Discharge: 2011-04-04 | Disposition: A | Discharge status: Home / Self Care | Payer: PRIVATE HEALTH INSURANCE | Source: Ambulatory Visit | Attending: Internal Medicine | Admitting: Internal Medicine

## 2011-04-04 DIAGNOSIS — Z1231 Encounter for screening mammogram for malignant neoplasm of breast: Secondary | ICD-10-CM

## 2011-04-12 ENCOUNTER — Other Ambulatory Visit: Payer: Self-pay | Admitting: Internal Medicine

## 2011-05-08 ENCOUNTER — Encounter: Payer: Self-pay | Admitting: Internal Medicine

## 2011-05-08 ENCOUNTER — Ambulatory Visit (INDEPENDENT_AMBULATORY_CARE_PROVIDER_SITE_OTHER): Payer: PRIVATE HEALTH INSURANCE | Admitting: Internal Medicine

## 2011-05-08 VITALS — BP 124/78 | HR 84 | Temp 97.6°F | Resp 16 | Wt 195.1 lb

## 2011-05-08 DIAGNOSIS — N390 Urinary tract infection, site not specified: Secondary | ICD-10-CM

## 2011-05-08 DIAGNOSIS — R350 Frequency of micturition: Secondary | ICD-10-CM

## 2011-05-08 LAB — POCT URINALYSIS DIPSTICK
Glucose, UA: NEGATIVE
Ketones, UA: NEGATIVE
Spec Grav, UA: 1.01
Urobilinogen, UA: 0.2

## 2011-05-08 MED ORDER — SULFAMETHOXAZOLE-TRIMETHOPRIM 800-160 MG PO TABS: 1.0000 | ORAL_TABLET | Freq: Two times a day (BID) | ORAL | Status: AC

## 2011-05-09 LAB — URINE CULTURE: Organism ID, Bacteria: NO GROWTH

## 2011-05-13 DIAGNOSIS — N390 Urinary tract infection, site not specified: Secondary | ICD-10-CM | POA: Insufficient documentation

## 2011-05-13 NOTE — Progress Notes (Signed)
  Subjective:    Patient ID: Mary Roth, female    DOB: [birth year], 78 y.o.   MRN: [id number]  HPI  Pt presents to clinic for evaluation of possible UTI. Notes 2day h/o urinary frequency and bladders spasms without fever, chills, nausea or vomiting. No alleviating or exacerbating factors. No other complaints.  Past Medical History  Diagnosis Date  . Family history of malignant neoplasm of gastrointestinal tract   . Personal history of colonic polyps 08/21/2006    ADENOMATOUS POLYP  . Fatty liver   . Hepatic steatosis   . Headache   . Chills (without fever)   . Disorder of bone and cartilage, unspecified   . Urinary frequency   . Pain in thoracic spine   . Abnormal liver function test   . Mastodynia   . Unspecified essential hypertension   . Other and unspecified hyperlipidemia   . Unspecified adverse effect of unspecified drug, medicinal and biological substance   . Esophageal reflux   . Unspecified asthma   . Edema   . Cramp of limb   . Unspecified hypothyroidism    Past Surgical History  Procedure Date  . Appendectomy   . Cholecystectomy     reports that she has never smoked. She does not have any smokeless tobacco history on file. She reports that she drinks alcohol. She reports that she does not use illicit drugs. family history includes Alcohol abuse in her brother; Breast cancer in her sisters; Colon cancer in her mother; Heart disease in her father; Ovarian cancer in her mother; Prostate cancer in her brothers and father; and Uterine cancer in her mother. Allergies  Allergen Reactions  . Ciprofloxacin     ? Difficulty breathing     Review of Systems see hpi     Objective:   Physical Exam  Nursing note and vitals reviewed. Constitutional: She appears well-developed and well-nourished. No distress.  HENT:  Head: Normocephalic and atraumatic.  Musculoskeletal:       No cva tenderness  Neurological: She is alert.  Skin: She is not diaphoretic.    Psychiatric: She has a normal mood and affect.          Assessment & Plan:

## 2011-05-13 NOTE — Assessment & Plan Note (Signed)
UA reviewed and c/w UTI. Attempt course of septra. Followup if no improvement or worsening.

## 2011-06-17 ENCOUNTER — Other Ambulatory Visit: Payer: Self-pay | Admitting: Internal Medicine

## 2011-06-23 ENCOUNTER — Other Ambulatory Visit: Payer: Self-pay | Admitting: Internal Medicine

## 2011-08-05 ENCOUNTER — Other Ambulatory Visit: Payer: Self-pay | Admitting: Internal Medicine

## 2011-08-25 ENCOUNTER — Other Ambulatory Visit: Payer: Self-pay | Admitting: Internal Medicine

## 2011-09-01 ENCOUNTER — Telehealth: Payer: Self-pay | Admitting: Internal Medicine

## 2011-09-01 ENCOUNTER — Ambulatory Visit (INDEPENDENT_AMBULATORY_CARE_PROVIDER_SITE_OTHER): Payer: Medicare Other | Admitting: Internal Medicine

## 2011-09-01 ENCOUNTER — Ambulatory Visit (HOSPITAL_BASED_OUTPATIENT_CLINIC_OR_DEPARTMENT_OTHER)
Admission: RE | Admit: 2011-09-01 | Discharge: 2011-09-01 | Disposition: A | Discharge status: Home / Self Care | Payer: Medicare Other | Source: Ambulatory Visit | Attending: Internal Medicine | Admitting: Internal Medicine

## 2011-09-01 ENCOUNTER — Encounter: Payer: Self-pay | Admitting: Internal Medicine

## 2011-09-01 VITALS — BP 130/76 | HR 81 | Temp 97.8°F | Wt 201.0 lb

## 2011-09-01 DIAGNOSIS — R042 Hemoptysis: Secondary | ICD-10-CM

## 2011-09-01 DIAGNOSIS — R0781 Pleurodynia: Secondary | ICD-10-CM

## 2011-09-01 DIAGNOSIS — J45901 Unspecified asthma with (acute) exacerbation: Secondary | ICD-10-CM

## 2011-09-01 DIAGNOSIS — J45909 Unspecified asthma, uncomplicated: Secondary | ICD-10-CM | POA: Insufficient documentation

## 2011-09-01 DIAGNOSIS — R071 Chest pain on breathing: Secondary | ICD-10-CM

## 2011-09-01 LAB — CBC WITH DIFFERENTIAL/PLATELET
Basophils Absolute: 0 10*3/uL (ref 0.0–0.1)
Eosinophils Absolute: 0.9 10*3/uL — ABNORMAL HIGH (ref 0.0–0.7)
HCT: 44.5 % (ref 36.0–46.0)
Hemoglobin: 14.6 g/dL (ref 12.0–15.0)
Lymphs Abs: 3.4 10*3/uL (ref 0.7–4.0)
MCHC: 32.9 g/dL (ref 30.0–36.0)
Monocytes Absolute: 0.9 10*3/uL (ref 0.1–1.0)
Neutro Abs: 8 10*3/uL — ABNORMAL HIGH (ref 1.4–7.7)
RDW: 14.3 % (ref 11.5–14.6)

## 2011-09-01 MED ORDER — FLUTICASONE-SALMETEROL 250-50 MCG/DOSE IN AEPB: 1.0000 | INHALATION_SPRAY | Freq: Two times a day (BID) | RESPIRATORY_TRACT | Status: DC

## 2011-09-01 MED ORDER — ALBUTEROL SULFATE (2.5 MG/3ML) 0.083% IN NEBU: 2.5000 mg | INHALATION_SOLUTION | RESPIRATORY_TRACT | Status: DC | PRN

## 2011-09-01 MED ORDER — PREDNISONE 20 MG PO TABS: 20.0000 mg | ORAL_TABLET | Freq: Two times a day (BID) | ORAL | Status: AC

## 2011-09-01 MED ORDER — AZITHROMYCIN 250 MG PO TABS: ORAL_TABLET | ORAL | Status: AC

## 2011-09-01 NOTE — Progress Notes (Signed)
  Subjective:    Patient ID: Mary Roth, female    DOB: 1929-02-26, 78 y.o.   MRN: [id number]  HPI She has had shortness of breath for 12 days despite using her nebulized albuterol 4 times a day in addition to the albuterol metered-dose inhaler in the morning. She has run out of her Advair. She's been using Allegra in the morning and Singulair at night.  She has had itchy eyes and sneezing.  She denies fever, chills, sweats, frontal headache, facial pain or purulent nasal secretions.  She has had intermittent yellow sputum production; she's had isolated minimal hemoptysis and intermittent pleuritic pain.  Past medical history/family history/social history were all reviewed and updated. Pertinent data: sister AO asthma; she had been a smoker   Review of Systems She denies significant reflux symptoms; she is on Nexium each morning. She is not on an ACE inhibitor or a beta blocker.     Objective:   Physical Exam General appearance:well nourished; no acute distress but minimal increased work of breathing is present.  No  lymphadenopathy about the head, neck, or axilla noted.   Eyes: No conjunctival inflammation or lid edema is present. There is no scleral icterus.  Ears:  External ear exam shows no significant lesions or deformities.  Otoscopic examination reveals clear canals, tympanic membranes are intact bilaterally without bulging, retraction, inflammation or discharge.  Nose:  External nasal examination shows no deformity or inflammation. Nasal mucosa are dry without lesions or exudates. No septal dislocation or deviation.No obstruction to airflow.   Oral exam: Dental hygiene is good; lips and gums are healthy appearing.There is no oropharyngeal erythema or exudate noted.   Neck:  No deformities, thyromegaly, masses, or tenderness noted.    Heart:  Normal rate and regular rhythm. S1 and S2 normal without gallop, murmur, click, rub or other extra sounds. Heart sounds are partially  secured by harsh diffuse wheezes  Lungs: Rhonchi and wheezing in all lung fields; wheezing audible  at 3 feet with forced expiration. Intermittent, nonproductive cough Extremities:  No cyanosis or clubbing  noted . Trace lower shin edema. Homans sign negative bilaterally   Skin: Warm & dry w/o jaundice           Assessment & Plan:  #1 Asthmatic bronchitis  #2 intermittent pleuritic pain  #3 scant hemoptysis Plan: See orders and recommendations

## 2011-09-01 NOTE — Patient Instructions (Signed)
Please schedule followup appointment Fri 4/26. Order for x-rays entered into  the computer; these will be performed at [facility]. No appointment is necessary.    Please try to go on My Chart within the next 24 hours to allow me to release the results directly to you.

## 2011-09-01 NOTE — Telephone Encounter (Signed)
Caller: Faithann/Mother; PCP: Marga Melnick; CB#: [phone]; ; ; Call regarding Asthma;  Pt has had wheezing for 10 days that she can't get on top of.  Pt has been using her nebulizer 4x/day/Allegra and Singulair. She has also been going outside with a mask on.  Pt is calling this am to request an appt with Dr. Alwyn Ren. RN scheduled an appt at 1pm.

## 2011-09-02 ENCOUNTER — Other Ambulatory Visit: Payer: Self-pay | Admitting: Internal Medicine

## 2011-09-02 NOTE — Telephone Encounter (Signed)
Refill for Detrol LA 2MG  Qty 30  never written here Send to [facility]

## 2011-09-02 NOTE — Telephone Encounter (Signed)
Dr.Hopper please advise I reviewed centricity and seen that this medication was previously rx'ed in 2011, medication is not on active medication list. Please advise if ok to re-add

## 2011-09-02 NOTE — Telephone Encounter (Signed)
OK X1 

## 2011-09-03 MED ORDER — TOLTERODINE TARTRATE ER 2 MG PO CP24: 2.0000 mg | ORAL_CAPSULE | Freq: Every day | ORAL | Status: DC

## 2011-09-03 NOTE — Telephone Encounter (Signed)
Rx sent 

## 2011-09-05 ENCOUNTER — Encounter: Payer: Self-pay | Admitting: Internal Medicine

## 2011-09-05 ENCOUNTER — Ambulatory Visit (INDEPENDENT_AMBULATORY_CARE_PROVIDER_SITE_OTHER): Payer: Medicare Other | Admitting: Internal Medicine

## 2011-09-05 VITALS — BP 122/76 | HR 73 | Wt 195.2 lb

## 2011-09-05 DIAGNOSIS — J45909 Unspecified asthma, uncomplicated: Secondary | ICD-10-CM

## 2011-09-05 MED ORDER — BECLOMETHASONE DIPROPIONATE 80 MCG/ACT IN AERS: 1.0000 | INHALATION_SPRAY | Freq: Two times a day (BID) | RESPIRATORY_TRACT | Status: DC

## 2011-09-05 NOTE — Patient Instructions (Addendum)
An agent such as Qvar is a preventive or prophylactic medication used to control inflammation which is a major driving force in asthma. The dose is 2 puffs every 12 hours with acute flares; maintenance dose will be 1 puff daily to  every 12 hours as needed. Gargle & spit after its use When you refill the medications; verify which is the preferred option on your plan.  It is preferable to prevent the asthma rather than to have to use Advair as maintenance

## 2011-09-05 NOTE — Progress Notes (Signed)
  Subjective:    Patient ID: Mary Roth, female    DOB: Aug 17, 1928, 78 y.o.   MRN: [id number]  HPI She states that she is significantly better, "almost well". She has dyspnea only with significant exertion. She feels warm at times which she relates to prednisone. She denies fever, chills, or sweats.  She has minimal edema and scant, insignificant sputum production.  She has no chest pain, palpitations, paroxysmal nocturnal dyspnea, or claudication.    Review of Systems She does have some nocturnal awakening; again she attributes this to the prednisone. She  denies frontal headache, facial pain, or nasal purulence.     Objective:   Physical Exam General appearance:well nourished; no acute distress or increased work of breathing is present.  No  lymphadenopathy about the head, neck, or axilla noted.   Eyes: No conjunctival inflammation or lid edema is present.   Ears:  External ear exam shows no significant lesions or deformities.  Otoscopic examination reveals clear canals, tympanic membranes are intact bilaterally without bulging, retraction, inflammation or discharge.  Nose:  External nasal examination shows no deformity or inflammation. Nasal mucosa are pink and moist without lesions or exudates. No septal dislocation or deviation.No obstruction to airflow.   Oral exam: Dental hygiene is good; lips and gums are healthy appearing.There is no oropharyngeal erythema or exudate noted.      Heart:  Normal rate and regular rhythm. S1 and S2 normal without gallop, murmur, click, rub or other extra sounds.   Lungs:Chest clear to auscultation; no wheezes, rhonchi,rales ,or rubs present.No increased work of breathing.    Extremities:  No cyanosis or clubbing  noted . Trace edema at sock line   Skin: Warm & dry           Assessment & Plan:  #1 asthmatic bronchitis; dramatic improvement. No active bronchospasm at this time. I will ask her to decrease the prednisone to a half a  pill twice a day over the weekend and then stop. She should continue the Qvar 1 inhalation every 12 hours as prophylaxis

## 2011-09-23 ENCOUNTER — Ambulatory Visit (INDEPENDENT_AMBULATORY_CARE_PROVIDER_SITE_OTHER): Payer: Medicare Other | Admitting: Internal Medicine

## 2011-09-23 ENCOUNTER — Encounter: Payer: Self-pay | Admitting: Internal Medicine

## 2011-09-23 VITALS — BP 124/86 | HR 83 | Temp 97.6°F | Wt 198.0 lb

## 2011-09-23 DIAGNOSIS — J45909 Unspecified asthma, uncomplicated: Secondary | ICD-10-CM

## 2011-09-23 MED ORDER — PREDNISONE 10 MG PO TABS: ORAL_TABLET | ORAL | Status: DC

## 2011-09-23 MED ORDER — DOXYCYCLINE HYCLATE 100 MG PO TABS: 100.0000 mg | ORAL_TABLET | Freq: Two times a day (BID) | ORAL | Status: AC

## 2011-09-23 NOTE — Assessment & Plan Note (Addendum)
Asthma previously well controlled on qvar-singulair,  Presents w/ a exacerbation started on early April. She was seen 09/01/2011, chest x-ray was negative, prescribe a zpack, qvar was switch to Advair (temporarily) and was prescribed prednisone. She did great but now symptoms are back, seeHPI Plan: Prednisone Doxycycline Continue with inhaled steroids Albuterol as needed

## 2011-09-23 NOTE — Patient Instructions (Signed)
Prednisone as prescribed for 9 days Doxycycline for one week Qvar 2 puffs twice a day Albuterol 4 times a day as needed come back in 10 days for a checkup ER if symptoms severe or fever.

## 2011-09-23 NOTE — Progress Notes (Signed)
  Subjective:    Patient ID: Mary Roth, female    DOB: Dec 30, 1928, 78 y.o.   MRN: [id number]  HPI Acute visit She was doing well up until early April, was seen 09/01/2011, she had some hemoptysis, diagnosed with bronchitis, chest x-ray was negative. She was treated with prednisone, Z-Pak, Advair. She was seen on followup, she was much improved. As she ran out off the antibiotics and prednisone, her symptoms gradually come back. Currently complaining of cough and wheezing daily.  Past Medical History  Diagnosis Date  . Personal history of colonic polyps 08/21/2006    ADENOMATOUS POLYP  . Fatty liver   . Hepatic steatosis   . Disorder of bone and cartilage, unspecified   . Abnormal liver function test   . Mastodynia   . Unspecified essential hypertension   . Other and unspecified hyperlipidemia   . Esophageal reflux   . Unspecified asthma 2003     no childhood asthma  . Edema   . Unspecified hypothyroidism        Review of Systems No fever or chills No chest pain, mild back ache whenever she coughs. Very mild shortness of breath, not as severe as it was a few days ago. No further hemoptysis, she still cough a small amount of creamy sputum. Very mild lower extremity edema. GERD symptoms well-controlled with PPIs. She's not a smoker, has not been exposed to tobacco lately.     Objective:   Physical Exam General -- alert, well-developed, and overweight appearing. No apparent distress.  HEENT -- TMs slightly bulged bilaterally without redness, throat w/o redness, face symmetric and not tender to palpation, nose not congested Lungs -- normal respiratory effort, no intercostal retractions, no accessory muscle use, bilateral wheezing, some rhonchi without respiratory distress. Heart-- normal rate, regular rhythm, no murmur, and no gallop.   Extremities-- no pretibial edema bilaterally  Psych-- Cognition and judgment appear intact. Alert and cooperative with normal  attention span and concentration.  not anxious appearing and not depressed appearing.       Assessment & Plan:

## 2011-10-03 ENCOUNTER — Ambulatory Visit: Payer: Medicare Other | Admitting: Internal Medicine

## 2011-11-06 ENCOUNTER — Telehealth: Payer: Self-pay | Admitting: *Deleted

## 2011-11-06 MED ORDER — LEVOTHYROXINE SODIUM 88 MCG PO TABS: 88.0000 ug | ORAL_TABLET | Freq: Every day | ORAL | Status: DC

## 2011-11-06 NOTE — Telephone Encounter (Signed)
Rx sent left Pt detail message. 

## 2011-11-06 NOTE — Telephone Encounter (Signed)
Synthroid 88 mcg one daily dispense 90

## 2011-11-06 NOTE — Telephone Encounter (Signed)
Pt left VM that [facility] informed her that they are no longer making LEVOXYL 88 MCG. Pt needs a alternative sent into the pharmacy. Pt notes that she is leaving to go out town next week and would like to have Rx before she leaves.Please advise .

## 2011-11-10 ENCOUNTER — Other Ambulatory Visit: Payer: Self-pay | Admitting: *Deleted

## 2011-11-10 NOTE — Telephone Encounter (Signed)
Pt requesting refill on advair. Pt notes that she is [place] beach for the [place] or [place]. Pt indicated that the beach air causes her asthma to flare up. .Please advise not on med list.

## 2011-11-10 NOTE — Telephone Encounter (Signed)
Advair 250/50 one inhalation every 12 hours; gargle and spit after use

## 2011-11-10 NOTE — Telephone Encounter (Signed)
Left message to call office

## 2011-11-12 MED ORDER — FLUTICASONE-SALMETEROL 250-50 MCG/DOSE IN AEPB: INHALATION_SPRAY | RESPIRATORY_TRACT | Status: DC

## 2011-11-12 NOTE — Telephone Encounter (Signed)
Discuss with patient, Rx sent. 

## 2011-11-21 ENCOUNTER — Other Ambulatory Visit: Payer: Self-pay | Admitting: Internal Medicine

## 2011-12-11 ENCOUNTER — Other Ambulatory Visit: Payer: Self-pay | Admitting: Internal Medicine

## 2012-01-20 IMAGING — CR DG THORACIC SPINE 2V
3 series · 3 of 3 positions shown · non-contrast
Comparison: None.

CLINICAL DATA: Back pain

THORACIC SPINE - 2 VIEW

[view not recorded (1 of 3)]
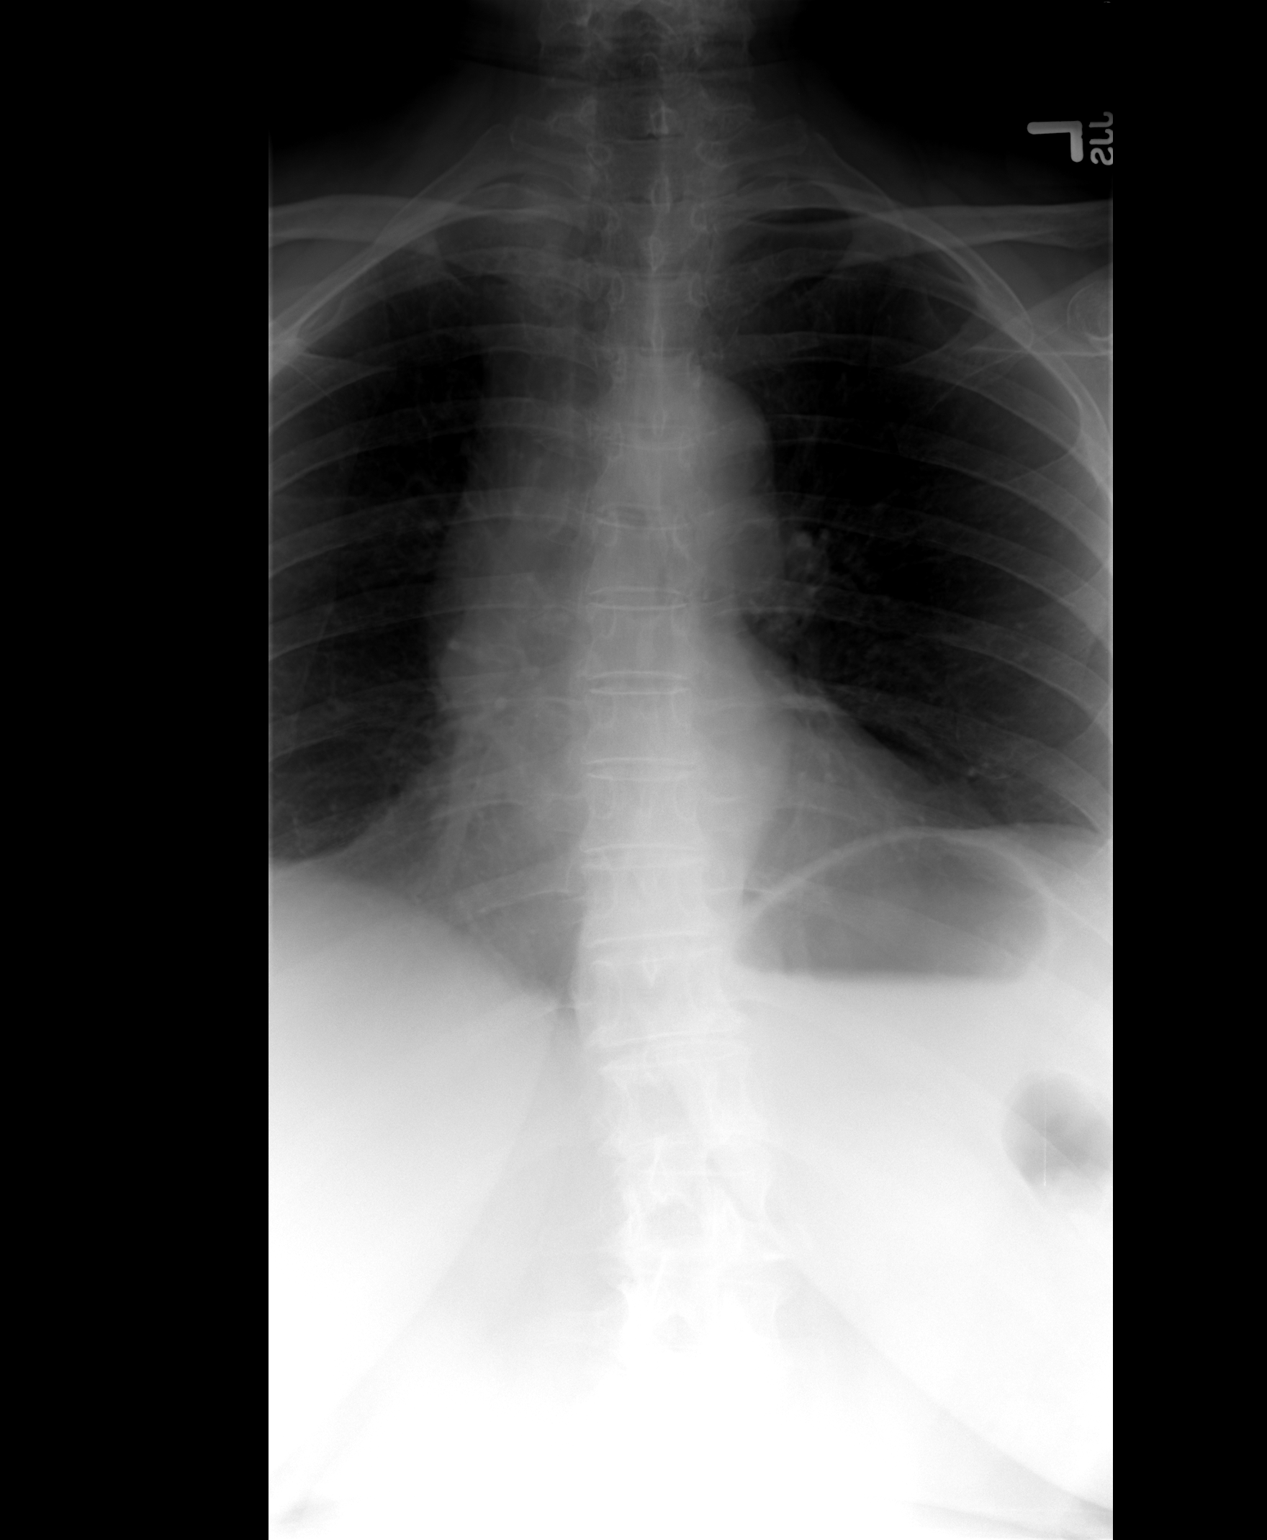

[view not recorded (2 of 3)]
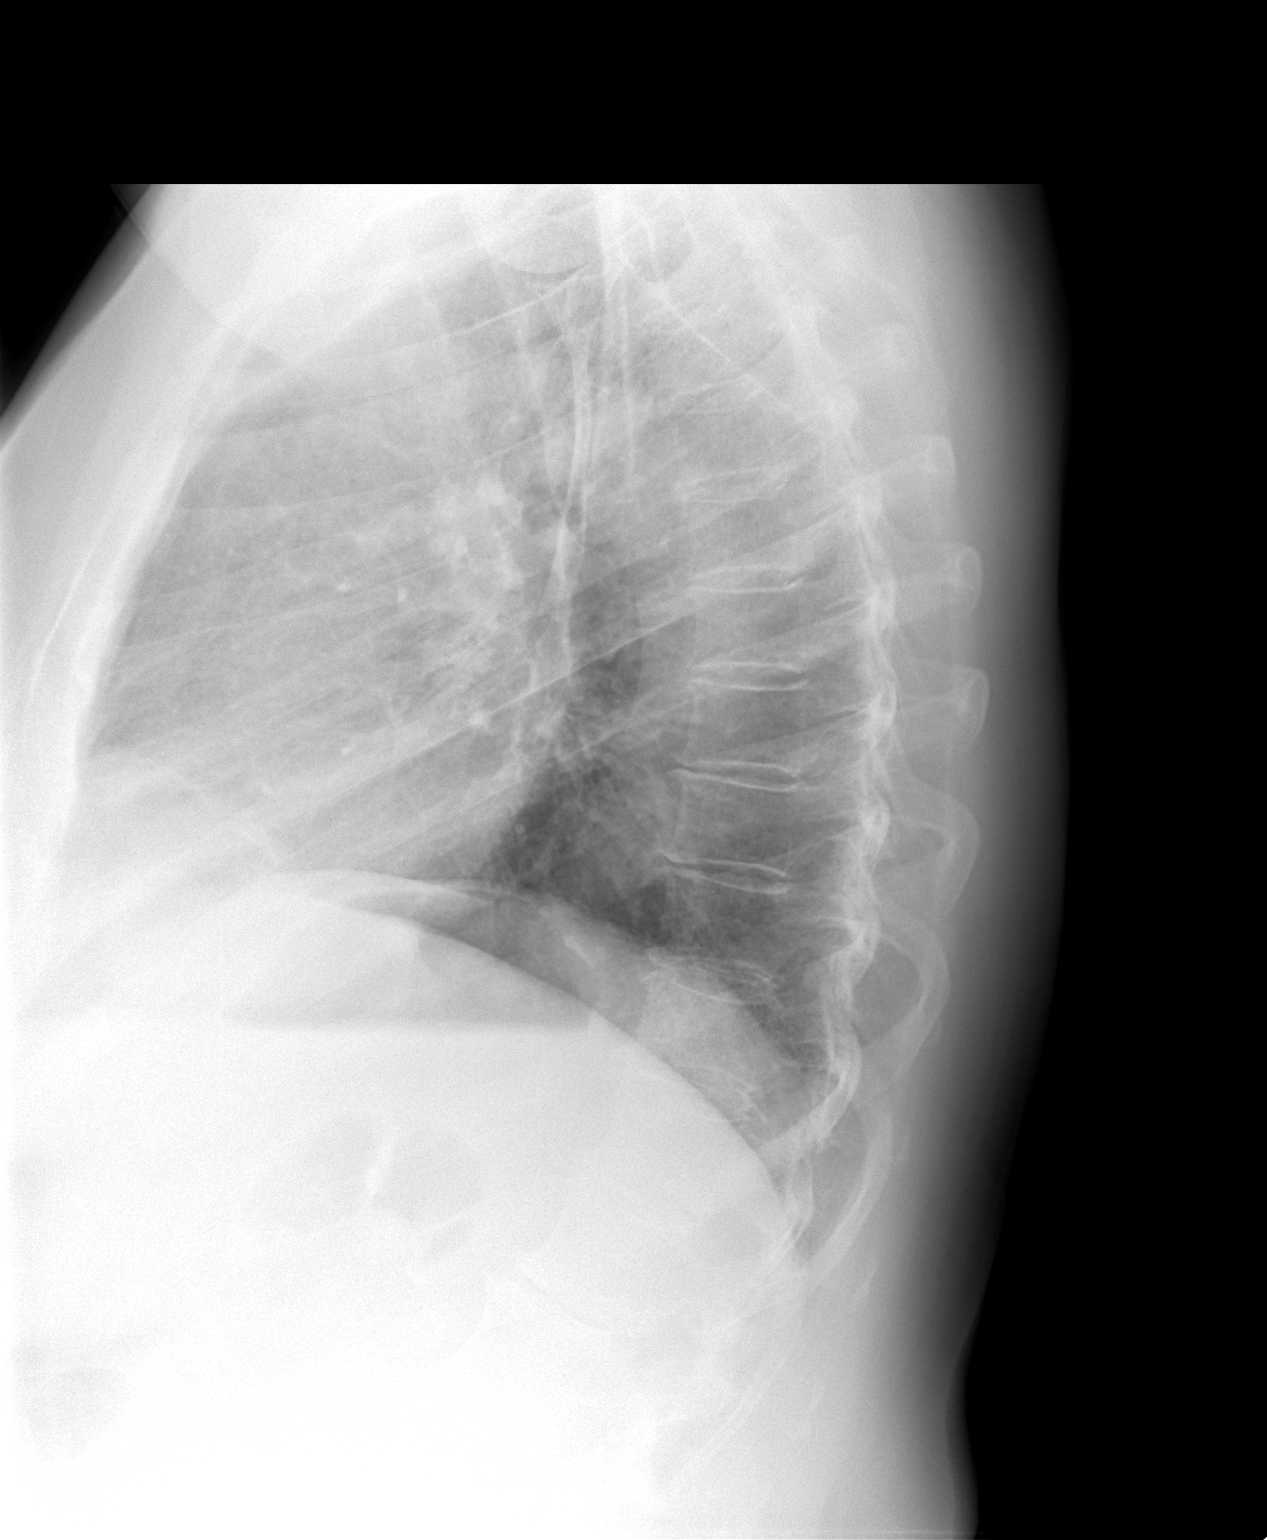

[view not recorded (3 of 3)]
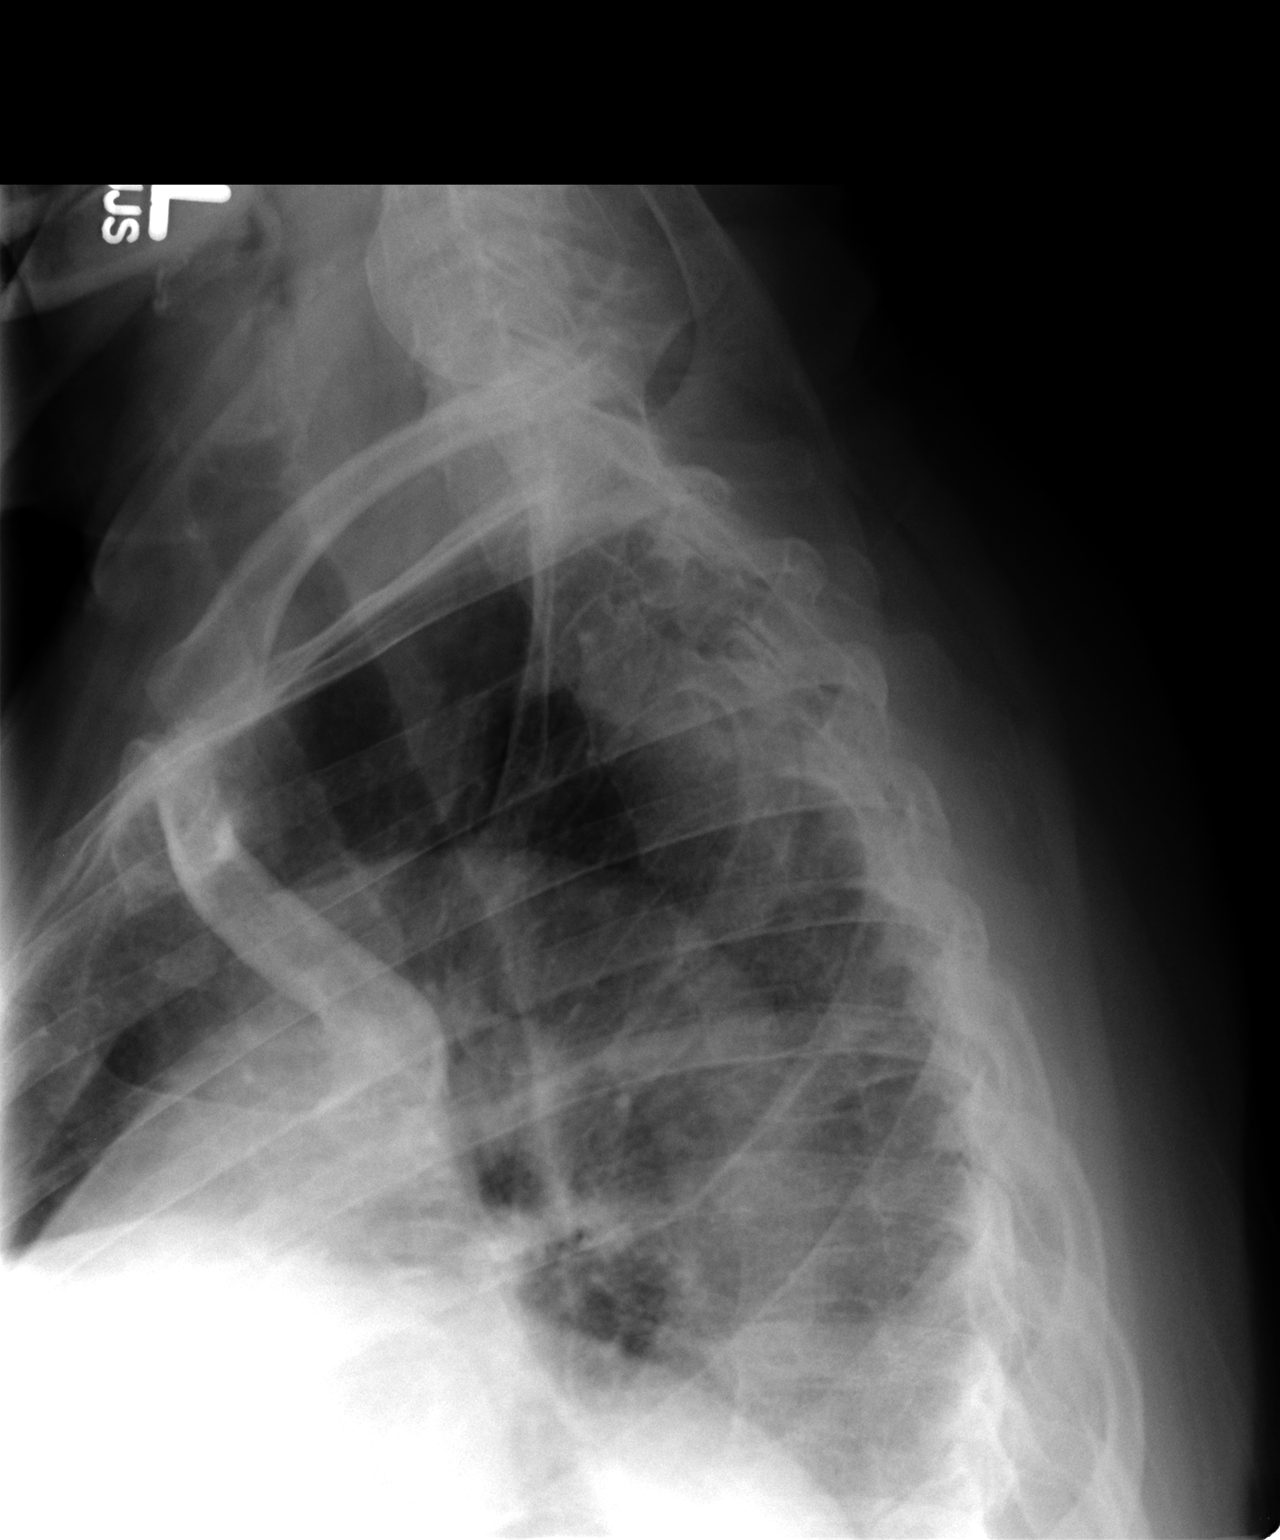

[3 of 3 positions shown; findings below may reference images not displayed]

FINDINGS: Anatomic alignment in the thoracic spine.  Levoscoliosis
of the lumbar  spine is present at the apex at L1-2.  No vertebral
body height loss.  Degenerative changes at the thoracolumbar
junction are present.  No definite fracture.
IMPRESSION: Chronic changes.  No acute bony pathology.  Levoscoliosis in the
lumbar spine.

## 2012-01-29 ENCOUNTER — Encounter: Payer: Self-pay | Admitting: Gastroenterology

## 2012-02-09 ENCOUNTER — Other Ambulatory Visit: Payer: Self-pay | Admitting: Internal Medicine

## 2012-02-09 NOTE — Telephone Encounter (Signed)
TSH 244.9 

## 2012-02-20 ENCOUNTER — Other Ambulatory Visit: Payer: Self-pay | Admitting: Internal Medicine

## 2012-02-26 ENCOUNTER — Ambulatory Visit (INDEPENDENT_AMBULATORY_CARE_PROVIDER_SITE_OTHER): Payer: Medicare Other | Admitting: Gastroenterology

## 2012-02-26 ENCOUNTER — Encounter: Payer: Self-pay | Admitting: Gastroenterology

## 2012-02-26 VITALS — BP 132/80 | HR 100 | Ht 58.5 in | Wt 198.5 lb

## 2012-02-26 DIAGNOSIS — K5730 Diverticulosis of large intestine without perforation or abscess without bleeding: Secondary | ICD-10-CM

## 2012-02-26 DIAGNOSIS — Z86010 Personal history of colonic polyps: Secondary | ICD-10-CM

## 2012-02-26 DIAGNOSIS — Z800 Family history of malignant neoplasm of digestive organs: Secondary | ICD-10-CM

## 2012-02-26 MED ORDER — NA SULFATE-K SULFATE-MG SULF 17.5-3.13-1.6 GM/177ML PO SOLN: 1.0000 | Freq: Once | ORAL | Status: DC

## 2012-02-26 NOTE — Patient Instructions (Addendum)
You have been scheduled for a colonoscopy with propofol. Please follow written instructions given to you at your visit today.  Please pick up your prep kit at the pharmacy within the next 1-3 days. If you use inhalers (even only as needed), please bring them with you on the day of your procedure. CC:  Marga Melnick MD

## 2012-02-26 NOTE — Progress Notes (Signed)
History of Present Illness:  This is a  Very nice 78 year old Caucasian female in excellent health except for asthmatic bronchitis. She has a very strong family history of colon, and breast cancer. Her previous colonoscopies over the last 20 years have shown several polyps which have been removed, and she is due for followup exam at this time. She denies any gastrointestinal or general medical problems otherwise. Her appetite is good her weight is stable. The patient denies upper GI or hepatobiliary complaints, and had her gallbladder removed in 1973. Only other surgical operations are a history of bladder suspension  I have reviewed this patient's present history, medical and surgical past history, allergies and medications.     ROS: The remainder of the 10 point ROS is negative.. some dyspnea with exertion present.     Physical Exam: Blood pressure 132/80, pulse 100 and regular, weight 198 pounds and BMI of 40.78 General well developed well nourished patient in no acute distress, appearing their stated age Eyes PERRLA, no icterus, fundoscopic exam per opthamologist Skin no lesions noted Neck supple, no adenopathy, no thyroid enlargement, no tenderness Chest clear to percussion and auscultation Heart no significant murmurs, gallops or rubs noted Abdomen no hepatosplenomegaly masses or tenderness, BS normal.  Extremities no acute joint lesions, edema, phlebitis or evidence of cellulitis. Neurologic patient oriented x 3, cranial nerves intact, no focal neurologic deficits noted. Psychological mental status normal and normal affect.  Assessment and plan: Healthy 78 year old with a history of colon polyps and a strong family history of colon cancer. She is due for followup exam which has been scheduled at her convenience with Suprep bowel prep at her request. She has routine labs done regularly with Dr. Marga Melnick, and has no specific abnormalities or evidence of anemia. The risk and benefits  of colonoscopy and polypectomy have again been reviewed, and she agreeds to proceed as planned have asked continue her other medications as listed and reviewed per primary care. Previous colonoscopies have shown colonic diverticulosis, but she has had no problems with recurrent diverticulitis.  Encounter Diagnoses  Name Primary?  . Family history of colon cancer Yes  . Personal history of colonic polyps

## 2012-03-03 ENCOUNTER — Encounter: Payer: Self-pay | Admitting: Gastroenterology

## 2012-03-03 ENCOUNTER — Ambulatory Visit (AMBULATORY_SURGERY_CENTER): Payer: Medicare Other | Admitting: Gastroenterology

## 2012-03-03 VITALS — BP 143/81 | HR 79 | Temp 97.5°F | Resp 28 | Ht <= 58 in | Wt 198.0 lb

## 2012-03-03 DIAGNOSIS — Z800 Family history of malignant neoplasm of digestive organs: Secondary | ICD-10-CM

## 2012-03-03 DIAGNOSIS — Z86010 Personal history of colonic polyps: Secondary | ICD-10-CM

## 2012-03-03 DIAGNOSIS — D126 Benign neoplasm of colon, unspecified: Secondary | ICD-10-CM

## 2012-03-03 DIAGNOSIS — Z1211 Encounter for screening for malignant neoplasm of colon: Secondary | ICD-10-CM

## 2012-03-03 DIAGNOSIS — K5730 Diverticulosis of large intestine without perforation or abscess without bleeding: Secondary | ICD-10-CM

## 2012-03-03 MED ORDER — SODIUM CHLORIDE 0.9 % IV SOLN: 500.0000 mL | INTRAVENOUS | Status: DC

## 2012-03-03 NOTE — Progress Notes (Signed)
Patient did not experience any of the following events: a burn prior to discharge; a fall within the facility; wrong site/side/patient/procedure/implant event; or a hospital transfer or hospital admission upon discharge from the facility. (G8907) Patient did not have preoperative order for IV antibiotic SSI prophylaxis. (G8918)  

## 2012-03-03 NOTE — Progress Notes (Signed)
Dr. Jarold Motto removed 2 polyps in the cecum hot snare.  One polyp was retrieved, one polyp was destroyed and no tissue was collected. Mary Roth

## 2012-03-03 NOTE — Op Note (Addendum)
[facility] [address] [place], [address]   COLONOSCOPY PROCEDURE REPORT  PATIENT: Mary Roth, Mary Roth  MR#: [id number] BIRTHDATE: 03/08/1929 , 78  yrs. old GENDER: Female ENDOSCOPIST: Mardella Layman, MD, Clementeen Graham REFERRED BY:  Marga Melnick, M.D. PROCEDURE DATE:  03/03/2012 PROCEDURE:   Colonoscopy with snare polypectomy ASA CLASS:   Class III INDICATIONS:patient's immediate family history of colon cancer and patient's personal history of adenomatous colon polyps. MEDICATIONS: propofol (Diprivan) 200mg  IV  DESCRIPTION OF PROCEDURE:   After the risks and benefits and of the procedure were explained, informed consent was obtained.  A digital rectal exam revealed no abnormalities of the rectum.    The LB [id number]  endoscope was introduced through the anus and advanced to the cecum, which was identified by both the appendix and ileocecal valve .  The quality of the prep was Suprep poor . The instrument was then slowly withdrawn as the colon was fully examined.     COLON FINDINGS: Four smooth sessile polyps ranging between 3-88mm in size were found throughout the entire examined colon.  A polypectomy was performed with a cold snare and using snare cautery.  The resection was complete and the polyp tissue was partially retrieved.   There was moderate diverticulosis noted in the descending colon and sigmoid colon with associated muscular hypertrophy.     Retroflexed views revealed internal hemorrhoids. The scope was then withdrawn from the patient and the procedure completed.  COMPLICATIONS: There were no complications. ENDOSCOPIC IMPRESSION: 1.   Four sessile polyps ranging between 3-57mm in size were found throughout the entire examined colon; polypectomy was performed with a cold snare and using snare cautery 2.   There was moderate diverticulosis noted in the descending colon and sigmoid colon  RECOMMENDATIONS: 1.  Await pathology results 2.   Given your age, you will not need another colonoscopy for colon cancer screening or polyp surveillance.  These types of tests usually stop around the age 78.   REPEAT EXAM:  cc:  _______________________________ eSignedMardella Layman, MD, [facility] 03/03/2012 9:00 AM

## 2012-03-03 NOTE — Progress Notes (Signed)
Dr. Jarold Motto removed 1 sigmoid poly cold snare, did not retrieve.  Maw  The pt tolerated the colonoscopy very well. Maw

## 2012-03-03 NOTE — Patient Instructions (Addendum)

## 2012-03-04 ENCOUNTER — Telehealth: Payer: Self-pay | Admitting: *Deleted

## 2012-03-04 NOTE — Telephone Encounter (Signed)
  Follow up Call-  Call back number 03/03/2012  Post procedure Call Back phone  # [phone]  Permission to leave phone message Yes     Patient questions:  Do you have a fever, pain , or abdominal swelling? no Pain Score  0 *  Have you tolerated food without any problems? yes  Have you been able to return to your normal activities? yes  Do you have any questions about your discharge instructions: Diet   no Medications  no Follow up visit  no  Do you have questions or concerns about your Care? no  Actions: * If pain score is 4 or above: No action needed, pain <4.

## 2012-03-08 ENCOUNTER — Other Ambulatory Visit: Payer: Self-pay | Admitting: Internal Medicine

## 2012-03-08 ENCOUNTER — Encounter: Payer: Self-pay | Admitting: Gastroenterology

## 2012-03-12 DIAGNOSIS — M8580 Other specified disorders of bone density and structure, unspecified site: Secondary | ICD-10-CM

## 2012-03-12 HISTORY — DX: Other specified disorders of bone density and structure, unspecified site: M85.80

## 2012-03-16 ENCOUNTER — Other Ambulatory Visit: Payer: Self-pay | Admitting: Internal Medicine

## 2012-03-16 NOTE — Telephone Encounter (Signed)
On last Rx note stated need labs. Last labs 09/01/11 need okay Plz advise    MW

## 2012-03-16 NOTE — Telephone Encounter (Signed)
OK # 30 X1 but schedule TSH (244.9) & appt

## 2012-03-16 NOTE — Telephone Encounter (Signed)
TSH 244.9 

## 2012-03-17 NOTE — Telephone Encounter (Signed)
Lmovm for pt to call office. °

## 2012-03-17 NOTE — Telephone Encounter (Signed)
Pt has  CPE scheduled for 04/21/12 and will have TSH done at that time.

## 2012-03-24 ENCOUNTER — Other Ambulatory Visit: Payer: Self-pay | Admitting: Internal Medicine

## 2012-03-24 NOTE — Telephone Encounter (Signed)
Patient with pending appointment 04/2012

## 2012-03-26 ENCOUNTER — Encounter: Payer: Self-pay | Admitting: Women's Health

## 2012-03-26 ENCOUNTER — Ambulatory Visit (INDEPENDENT_AMBULATORY_CARE_PROVIDER_SITE_OTHER): Payer: Medicare Other | Admitting: Women's Health

## 2012-03-26 VITALS — BP 120/94 | Ht 59.0 in | Wt 195.0 lb

## 2012-03-26 DIAGNOSIS — Z780 Asymptomatic menopausal state: Secondary | ICD-10-CM

## 2012-03-26 DIAGNOSIS — Z01419 Encounter for gynecological examination (general) (routine) without abnormal findings: Secondary | ICD-10-CM

## 2012-03-26 NOTE — Progress Notes (Signed)
Mary Roth 17-Jun-1928 [id number]    History:    The patient presents for Pap and pelvic exam. Postmenopausal with no bleeding or HRT. History of normal Paps and mammograms. Colonoscopy last month when polyp questionable adenomyoma. Primary care manages medications for hypertension, hypercholesterolemia and hypothyroidism. Has not had a bone density in many years. Has had Pneumovax and zostovac vaccine's and flu vaccine.  Past medical history, past surgical history, family history and social history were all reviewed and documented in the EPIC chart. 3 sisters with breast cancer. Brother with lung cancer.   Exam:  Filed Vitals:   03/26/12 1528  BP: 120/94    General appearance:  Normal Head/Neck:  Normal, without cervical or supraclavicular adenopathy. Thyroid:  Symmetrical, normal in size, without palpable masses or nodularity. Respiratory  Effort:  Normal  Auscultation:  Clear without wheezing or rhonchi Cardiovascular  Auscultation:  Regular rate, without rubs, murmurs or gallops  Edema/varicosities:  Not grossly evident Abdominal  Soft,nontender, without masses, guarding or rebound.  Liver/spleen:  No organomegaly noted  Hernia:  None appreciated  Skin  Inspection:  Grossly normal  Palpation:  Grossly normal Neurologic/psychiatric  Orientation:  Normal with appropriate conversation.  Mood/affect:  Normal  Genitourinary    Breasts: Examined lying and sitting.     Right: Without masses, retractions, discharge or axillary adenopathy.     Left: Without masses, retractions, discharge or axillary adenopathy.   Inguinal/mons:  Normal without inguinal adenopathy  External genitalia:  Normal  BUS/Urethra/Skene's glands:  Normal  Bladder:  Normal  Vagina:  Normal  Cervix:  Normal  Uterus:  normal in size, shape and contour.  Midline and mobile  Adnexa/parametria:     Rt: Without masses or tenderness.   Lt: Without masses or tenderness.  Anus and  perineum: Normal  Digital rectal exam: Normal sphincter tone without palpated masses or tenderness  Assessment/Plan:  78 y.o. MWF G2 P2  for breast and pelvic exam.  Normal postmenopausal exam/no HRT/no bleeding Hypertension/hypercholesterolemia/hypothyroid/asthma-primary care labs and meds. Obesity  Plan: Reviewed importance of decreasing calories and increasing regular exercise for weight loss and health. DEXA will schedule here, continue SBE's, annual mammogram, calcium rich diet, vitamin D 2000 daily encouraged. Normal Pap 2012, reviewed the recommendations and no further Paps needed. Normal mammogram 2012.    Harrington Challenger WHNP, 5:02 PM 03/26/2012

## 2012-03-26 NOTE — Patient Instructions (Addendum)
Vit D 2000 daily Walk daily  30 min daily   Health Recommendations for Postmenopausal Women Based on the Results of the Women's Health Initiative [facility]) and Other Studies The WHI is a major 15-year research program to address the most common causes of death, disability and poor quality of life in postmenopausal women. Some of these causes are heart disease, cancer, bone loss (osteoporosis) and others. Taking into account all of the findings from [facility] and other studies, here are bottom-line health recommendations for women: CARDIOVASCULAR DISEASE Heart Disease: A heart attack is a medical emergency. Know the signs and symptoms of a heart attack. Hormone therapy should not be used to prevent heart disease. In women with heart disease, hormone therapy should not be used to prevent further disease. Hormone therapy increases the risk of blood clots. Below are things women can do to reduce their risk for heart disease.   Do not smoke. If you smoke, quit. Women who smoke are 2 to 6 times more likely to suffer a heart attack than non-smoking women.  Aim for a healthy weight. Being overweight causes many preventable deaths. Eat a healthy and balanced diet and drink an adequate amount of liquids.  Get moving. Make a commitment to be more physically active. Aim for 30 minutes of activity on most, if not all days of the week.  Eat for heart health. Choose a diet that is low in saturated fat, trans fat, and cholesterol. Include whole grains, vegetables, and fruits. Read the labels on the food container before buying it.  Know your numbers. Ask your caregiver to check your blood pressure, cholesterol (total, HDL, LDL, triglycerides) and blood glucose. Work with your caregiver to improve any numbers that are not normal.  High blood pressure. Limit or stop your table salt intake (try salt substitute and food seasonings), avoid salty foods and drinks. Read the labels on the food container before buying it. Avoid  becoming overweight by eating well and exercising. STROKE  Stroke is a medical emergency. Stroke can be the result of a blood clot in the blood vessel in the brain or by a brain hemorrhage (bleeding). Know the signs and symptoms of a stroke. To lower the risk of developing a stroke:  Avoid fatty foods.  Quit smoking.  Control your diabetes, blood pressure, and irregular heart rate. THROMBOPHLIBITIS (BLOOD CLOT) OF THE LEG  Hormone treatment is a big cause of developing blood clots in the leg. Becoming overweight and leading a stationary lifestyle also may contribute to developing blood clots. Controlling your diet and exercising will help lower the risk of developing blood clots. CANCER SCREENING  Breast Cancer: Women should take steps to reduce their risk of breast cancer. This includes having regular mammograms, monthly self breast exams and regular breast exams by your caregiver. Have a mammogram every one to two years if you are 67 to 76 years old. Have a mammogram annually if you are 7 years old or older depending on your risk factors. Women who are high risk for breast cancer may need more frequent mammograms. There are tests available (testing the genes in your body) if you have family history of breast cancer called BRCA 1 and 2. These tests can help determine the risks of developing breast cancer.  Intestinal or Stomach Cancer: Women should talk to their caregiver about when to start screening, what tests and how often they should be done, and the benefits and risks of doing these tests. Tests to consider are a rectal  exam, fecal occult blood, sigmoidoscopy, colononoscoby, barium enema and upper GI series of the stomach. Depending on the age, you may want to get a medical and family history of colon cancer. Women who are high risk may need to be screened at an earlier age and more often.  Cervical Cancer: A Pap test of the cervix should be done every year and every 3 years when there has  been three straight years of a normal Pap test. Women with an abnormal Pap test should be screened more often or have a cervical biopsy depending on your caregiver's recommendation.  Uterine Cancer: If you have vaginal bleeding after you are in the menopause, it should be evaluated by your caregiver.  Ovarian cancer: There are no reliable tests available to screen for ovarian cancer at this time except for yearly pelvic exams.  Lung Cancer: Yearly chest X-rays can detect lung cancer and should be done on high risk women, such as cigarette smokers and women with chronic lung disease (emphysemia).  Skin Cancer: A complete body skin exam should be done at your yearly examination. Avoid overexposure to the sun and ultraviolet light lamps. Use a strong sun block cream when in the sun. All of these things are important in lowering the risk of skin cancer. MENOPAUSE Menopause Symptoms: Hormone therapy products are effective for treating symptoms associated with menopause:  Moderate to severe hot flashes.  Night sweats.  Mood swings.  Headaches.  Tiredness.  Loss of sex drive.  Insomnia.  Other symptoms. However, hormone therapy products carry serious risks, especially in older women. Women who use or are thinking about using estrogen or estrogen with progestin treatments should discuss that with their caregiver. Your caregiver will know if the benefits outweigh the risks. The Food and Drug Administration (FDA) has concluded that hormone therapy should be used only at the lowest doses and for the shortest amount of time to reach treatment goals. It is not known at what doses there may be less risk of serious side effects. There are other treatments such as herbal medication (not controlled or regulated by the FDA), group therapy, counseling and acupuncture that may be helpful. OSTEOPOROSIS Protecting Against Bone Loss and Preventing Fracture: If hormone therapy is used for prevention of bone  loss (osteoporosis), the risks for bone loss must outweigh the risk of the therapy. Women considering taking hormone therapy for bone loss should ask their health care providers about other medications (fosamax and boniva) that are considered safe and effective for preventing bone loss and bone fractures. To guard against bone loss or fractures, it is recommended that women should take at least 1000-1500 mg of calcium and 400-800 IU of vitamin D daily in divided doses. Smoking and excessive alcohol intake increases the risk of osteoporosis. Eat foods rich in calcium and vitamin D and do weight bearing exercises several times a week as your caregiver suggests. DIABETES Diabetes Melitus: Women with Type I or Type 2 diabetes should keep their diabetes in control with diet, exercise and medication. Avoid too many sweets, starchy and fatty foods. Being overweight can affect your diabetes. COGNITION AND MEMORY Cognition and Memory: Menopausal hormone therapy is not recommended for the prevention of cognitive disorders such as Alzheimer's disease or memory loss. WHI found that women treated with hormone therapy have a greater risk of developing dementia.  DEPRESSION  Depression may occur at any age, but is common in elderly women. The reasons may be because of physical, medical, social (loneliness), financial and/or  economic problems and needs. Becoming involved with church, volunteer or social groups, seeking treatment for any physical or medical problems is recommended. Also, look into getting professional advice for any economic or financial problems. ACCIDENTS  Accidents are common and can be serious in the elderly woman. Prepare your house to prevent accidents. Eliminate throw rugs, use hip protectors, place hand bars in the bath, shower and toilet areas. Avoid wearing high heel shoes and walking on wet, snowy and icy areas. Stop driving if you have vision, hearing problems or are unsteady with you movements  and reflexes. RHEUMATOID ARTHRITIS Rheumatoid arthritis causes pain, swelling and stiffness of your bone joints. It can limit many of your activities. Over-the-counter medications may help, but prescription medications may be necessary. Talk with your caregiver about this. Exercise (walking, water aerobics), good posture, using splints on painful joints, warm baths or applying warm compresses to stiff joints and cold compresses to painful joints may be helpful. Smoking and excessive drinking may worsen the symptoms of arthritis. Seek help from a physical therapist if the arthritis is becoming a problem with your daily activities. IMMUNIZATIONS  Several immunizations are important to have during your senior years, including:   Tetanus and a diptheria shot booster every 10 years.  Influenza every year before the flu season begins.  Pneumonia vaccine.  Shingles vaccine.  Others as indicated (example: H1N1 vaccine). Document Released: 06/20/2005 Document Revised: 07/21/2011 Document Reviewed: 02/14/2008 [facility] Patient Information 2013 [place], [place].

## 2012-03-27 LAB — URINALYSIS W MICROSCOPIC + REFLEX CULTURE
Casts: NONE SEEN
Crystals: NONE SEEN
Glucose, UA: NEGATIVE mg/dL
Nitrite: NEGATIVE
Specific Gravity, Urine: 1.02 (ref 1.005–1.030)
WBC, UA: >50 WBC/hpf — AB (ref <3)
pH: 5 (ref 5.0–8.0)

## 2012-03-28 LAB — URINE CULTURE

## 2012-03-29 ENCOUNTER — Other Ambulatory Visit: Payer: Self-pay | Admitting: Women's Health

## 2012-03-29 ENCOUNTER — Encounter: Payer: Self-pay | Admitting: Gynecology

## 2012-03-29 DIAGNOSIS — N390 Urinary tract infection, site not specified: Secondary | ICD-10-CM

## 2012-03-29 MED ORDER — SULFAMETHOXAZOLE-TRIMETHOPRIM 800-160 MG PO TABS: 1.0000 | ORAL_TABLET | Freq: Two times a day (BID) | ORAL | Status: DC

## 2012-04-01 ENCOUNTER — Ambulatory Visit (INDEPENDENT_AMBULATORY_CARE_PROVIDER_SITE_OTHER): Payer: Medicare Other

## 2012-04-01 ENCOUNTER — Other Ambulatory Visit: Payer: Self-pay | Admitting: Gynecology

## 2012-04-01 DIAGNOSIS — M899 Disorder of bone, unspecified: Secondary | ICD-10-CM

## 2012-04-01 DIAGNOSIS — Z780 Asymptomatic menopausal state: Secondary | ICD-10-CM

## 2012-04-01 DIAGNOSIS — M8580 Other specified disorders of bone density and structure, unspecified site: Secondary | ICD-10-CM

## 2012-04-07 ENCOUNTER — Encounter: Payer: Self-pay | Admitting: Gynecology

## 2012-04-07 ENCOUNTER — Ambulatory Visit (INDEPENDENT_AMBULATORY_CARE_PROVIDER_SITE_OTHER): Payer: Medicare Other | Admitting: Gynecology

## 2012-04-07 DIAGNOSIS — M8580 Other specified disorders of bone density and structure, unspecified site: Secondary | ICD-10-CM

## 2012-04-07 DIAGNOSIS — M949 Disorder of cartilage, unspecified: Secondary | ICD-10-CM

## 2012-04-07 NOTE — Patient Instructions (Signed)
Office will contact you with the new calculated fracture risk. We'll then rediscuss medication use

## 2012-04-07 NOTE — Progress Notes (Addendum)
Patient presents to discuss DEXA results. T score -1.4 FRAX 33%/23%. In review of her FRAX report glucocorticoids is checked. On questioning she does use an inhaler intermittently that has steroids but not consistently and is not taking any oral steroids. I'm going to recalculate her FRAX taking that out of the calculation. Regardless I reviewed with her the risk of fracture with the given numbers and the devastating sequelae following a fracture. Her mother was a classic example where she was doing well fell broke her hip and died within the year. I reviewed treatment options to include oral bisphosphonates, IV bisphosphonates, Prolia, Forteo and hormonal options. My recommendation would be to go on Fosamax weekly given the significant fracture risk despite having a relatively low T score.  I reviewed Fosamax medication, how to take it and side effects. The risks of GI distress/esophageal cancer/osteonecrosis of the jaw/atypical fractures particularly with prolonged use all reviewed. Patient wants to think of our options will follow up with the newer FRAX calculation removing the steroid use and if still with a higher risk she will decide if she wants to start the medication or not. I emphasized the significant fracture reduction risk with the medication.

## 2012-04-12 ENCOUNTER — Encounter: Payer: Self-pay | Admitting: Gynecology

## 2012-04-12 ENCOUNTER — Telehealth: Payer: Self-pay | Admitting: Gynecology

## 2012-04-12 NOTE — Telephone Encounter (Signed)
Tell patient that we will recalculate her fracture risk. Her overall fracture risk is 20% down from 33%. Her hip fracture risk is 11% down from 23%. She still is at a higher risk of hip fracture at 11%. It's recommended to treat if she is above 3%. We talked about starting Fosamax which would be my recommendation. If she wants to do this then we'll go ahead and start this at 70 mg weekly #4 with 11 refills and office staff can place the order.

## 2012-04-13 NOTE — Telephone Encounter (Signed)
Left message to call with husband. Patient was out at grocery store.

## 2012-04-13 NOTE — Telephone Encounter (Signed)
Left message to call.

## 2012-04-15 ENCOUNTER — Other Ambulatory Visit: Payer: Self-pay | Admitting: Gynecology

## 2012-04-15 MED ORDER — ALENDRONATE SODIUM 70 MG PO TABS: 70.0000 mg | ORAL_TABLET | ORAL | Status: DC

## 2012-04-15 NOTE — Telephone Encounter (Signed)
I called patient again and spoke with her today.  She was happy to hear that her results improved and she is fine with starting Fosamax one weekly.  Rx was sent to her pharmacy.

## 2012-04-21 ENCOUNTER — Encounter: Payer: Self-pay | Admitting: Internal Medicine

## 2012-04-21 ENCOUNTER — Ambulatory Visit (INDEPENDENT_AMBULATORY_CARE_PROVIDER_SITE_OTHER): Payer: Medicare Other | Admitting: Internal Medicine

## 2012-04-21 VITALS — BP 126/80 | HR 99 | Temp 97.6°F | Resp 12 | Ht 58.08 in | Wt 195.6 lb

## 2012-04-21 DIAGNOSIS — E785 Hyperlipidemia, unspecified: Secondary | ICD-10-CM

## 2012-04-21 DIAGNOSIS — I10 Essential (primary) hypertension: Secondary | ICD-10-CM

## 2012-04-21 DIAGNOSIS — E039 Hypothyroidism, unspecified: Secondary | ICD-10-CM

## 2012-04-21 DIAGNOSIS — N390 Urinary tract infection, site not specified: Secondary | ICD-10-CM

## 2012-04-21 DIAGNOSIS — Z85828 Personal history of other malignant neoplasm of skin: Secondary | ICD-10-CM

## 2012-04-21 DIAGNOSIS — K219 Gastro-esophageal reflux disease without esophagitis: Secondary | ICD-10-CM

## 2012-04-21 DIAGNOSIS — M899 Disorder of bone, unspecified: Secondary | ICD-10-CM

## 2012-04-21 DIAGNOSIS — Z0000 Encounter for general adult medical examination without abnormal findings: Secondary | ICD-10-CM

## 2012-04-21 LAB — POCT URINALYSIS DIPSTICK
Bilirubin, UA: NEGATIVE
Glucose, UA: NEGATIVE
Nitrite, UA: NEGATIVE
pH, UA: 6

## 2012-04-21 MED ORDER — LEVOTHYROXINE SODIUM 88 MCG PO TABS: 88.0000 ug | ORAL_TABLET | Freq: Every day | ORAL | Status: DC

## 2012-04-21 NOTE — Progress Notes (Signed)
Subjective:    Patient ID: Mary Roth, female    DOB: 15-Feb-1929, 78 y.o.   MRN: [id number]  HPI Medicare Wellness Visit:  The following psychosocial & medical history were reviewed as required by Medicare.   Social history: caffeine: none  , alcohol:  4/ week ,  tobacco use : never  & exercise : minimal due to asthma & hip pain.   Home & personal  safety / fall risk: no issues, activities of daily living: no limitations except DOE , seatbelt use : yes , and smoke alarm employment : yes .  Power of Attorney/Living Will status : in place  Vision ( as recorded per Nurse) & Hearing  evaluation : Ophth exam up to dates; cataract surgery pending. No hearing exam. Orientation : oriented X 3 , memory & recall :good, spelling or math testing: good,and mood & affect : normal . Depression / anxiety: denied  Travel history : 2012 [place] , immunization status : up to date , transfusion history: no, and preventive health surveillance ( colonoscopies, BMD , etc as per protocol/ [facility]): colonoscopy 2013, Dental care:  Every  24 months . Chart reviewed &  Updated. Active issues reviewed & addressed.       Review of Systems HYPERTENSION: Disease Monitoring: Blood pressure range-no monitor  Chest pain, palpitations- no       Dyspnea- fair asthma control ; rare rescue MDI use Medications: Compliance- yes  Lightheadedness,Syncope- no    Edema- only in Summer  HYPERLIPIDEMIA: Medications: Compliance- yes  Abd pain, bowel changes- no   Muscle aches- no but shoulder & hip pain        Objective:   Physical Exam Gen.:  well-nourished in appearance. Alert, appropriate and cooperative throughout exam. Head: Normocephalic without obvious abnormalities Eyes: No corneal or conjunctival inflammation noted.  Extraocular motion intact. Vision grossly normal with lenses. Ears: External  ear exam reveals no significant lesions or deformities. Canals clear .TMs normal. Hearing is grossly normal  bilaterally. Nose: External nasal exam reveals no deformity or inflammation. Nasal mucosa are pink and moist. No lesions or exudates noted.  Mouth: Oral mucosa and oropharynx reveal no lesions or exudates. Teeth in good repair.Osteoma of hard palate Neck: No deformities, masses, or tenderness noted. Range of motion & Thyroid normal. Lungs: Normal respiratory effort; chest expands symmetrically. Lungs are clear to auscultation without rales, wheezes, or increased work of breathing. Heart: Normal rate and rhythm. Normal S1 and S2. No gallop, click, or rub. S4 with slurring   Abdomen: Bowel sounds normal; abdomen soft and nontender. No masses, organomegaly or hernias noted. Genitalia: Dr Audie Box, Gyn                                                                                   Musculoskeletal/extremities: Accentuated curvature of upper thoracic  spine. DIP osteoarthritic finger changes  No clubbing, cyanosis or  edema noted. Range of motion  normal .Tone & strength  normal. Nail health  good. Vascular: Carotid, radial artery, dorsalis pedis and  posterior tibial pulses are full and equal. No bruits present. Neurologic: Alert and oriented x3. Deep tendon reflexes symmetrical and normal.  Skin: Intact without suspicious lesions or rashes. Lymph: No cervical, axillary lymphadenopathy present. Psych: Mood and affect are normal. Normally interactive                                                                                         Assessment & Plan:   #1 Medicare Wellness Exam; criteria met ; data entered  Plan: see Orders

## 2012-04-21 NOTE — Addendum Note (Signed)
Addended by: Maurice Small on: 04/21/2012 05:53 PM   Modules accepted: Orders

## 2012-04-21 NOTE — Patient Instructions (Addendum)
Blood Pressure Goal = AVERAGE < 140/90, Ideal is an AVERAGE < 135/85. This AVERAGE should be calculated from @ least 5-7 BP readings taken @ different times of day on different days of week. You should not respond to isolated BP readings , but rather the AVERAGE for that week  Please  schedule fasting Labs : BMET,Lipids, hepatic panel,  TSH.   PLEASE BRING THESE INSTRUCTIONS TO FOLLOW UP  LAB APPOINTMENT.This will guarantee correct labs are drawn, eliminating need for repeat blood sampling ( needle sticks ! ). Diagnoses /Codes: 401.9,272.4,244.9   If you activate My Chart; the results can be released to you as soon as they populate from the lab. If you choose not to use this program; the labs have to be reviewed, copied & mailed   causing a delay in getting the results to you.

## 2012-04-24 LAB — URINE CULTURE
Colony Count: NO GROWTH
Organism ID, Bacteria: NO GROWTH

## 2012-04-26 ENCOUNTER — Other Ambulatory Visit (INDEPENDENT_AMBULATORY_CARE_PROVIDER_SITE_OTHER): Payer: Medicare Other

## 2012-04-26 DIAGNOSIS — I10 Essential (primary) hypertension: Secondary | ICD-10-CM

## 2012-04-26 DIAGNOSIS — E039 Hypothyroidism, unspecified: Secondary | ICD-10-CM

## 2012-04-26 DIAGNOSIS — E785 Hyperlipidemia, unspecified: Secondary | ICD-10-CM

## 2012-04-26 LAB — TSH: TSH: 0.98 u[IU]/mL (ref 0.35–5.50)

## 2012-04-26 LAB — BASIC METABOLIC PANEL
CO2: 25 mEq/L (ref 19–32)
Chloride: 105 mEq/L (ref 96–112)
Sodium: 140 mEq/L (ref 135–145)

## 2012-04-26 LAB — LIPID PANEL
Cholesterol: 192 mg/dL (ref 0–200)
HDL: 58.1 mg/dL (ref >39.00)
LDL Cholesterol: 103 mg/dL — ABNORMAL HIGH (ref 0–99)
Total CHOL/HDL Ratio: 3
Triglycerides: 155 mg/dL — ABNORMAL HIGH (ref 0.0–149.0)
VLDL: 31 mg/dL (ref 0.0–40.0)

## 2012-04-26 LAB — HEPATIC FUNCTION PANEL
AST: 24 U/L (ref 0–37)
Albumin: 3.7 g/dL (ref 3.5–5.2)
Total Protein: 7 g/dL (ref 6.0–8.3)

## 2012-04-27 LAB — HEMOGLOBIN A1C: Hgb A1c MFr Bld: 6.1 % (ref 4.6–6.5)

## 2012-04-29 ENCOUNTER — Other Ambulatory Visit: Payer: Self-pay | Admitting: Internal Medicine

## 2012-05-29 ENCOUNTER — Other Ambulatory Visit: Payer: Self-pay | Admitting: Internal Medicine

## 2012-06-02 ENCOUNTER — Ambulatory Visit (INDEPENDENT_AMBULATORY_CARE_PROVIDER_SITE_OTHER): Payer: Medicare Other | Admitting: *Deleted

## 2012-06-02 DIAGNOSIS — Z0000 Encounter for general adult medical examination without abnormal findings: Secondary | ICD-10-CM

## 2012-06-02 DIAGNOSIS — Z111 Encounter for screening for respiratory tuberculosis: Secondary | ICD-10-CM

## 2012-06-04 LAB — TB SKIN TEST: Induration: 0 mm

## 2012-06-26 ENCOUNTER — Other Ambulatory Visit: Payer: Self-pay

## 2012-07-06 ENCOUNTER — Telehealth: Payer: Self-pay | Admitting: *Deleted

## 2012-07-06 NOTE — Telephone Encounter (Signed)
Mary Roth would like call back to see if paperwork was received and what is the status of completion.Please advise

## 2012-07-07 NOTE — Telephone Encounter (Signed)
Hopp please advise if you have paperwork on patient

## 2012-07-07 NOTE — Telephone Encounter (Signed)
The last paperwork so were the admission forms for the retirement facility. These were completed and presumably sent. She was given a copy of her history @ her physical at the last visit which contains all her medical records.

## 2012-07-07 NOTE — Telephone Encounter (Signed)
Found form in folder behind [facility] desk with note Pt needs to have PPD advise facility of that. Facility will contact Pt and see if it can be performed there if not Pt will schedule OV to have it done. Form placed back in folder.

## 2012-07-08 ENCOUNTER — Telehealth: Payer: Self-pay

## 2012-07-08 NOTE — Telephone Encounter (Signed)
Message copied by Maurice Small on Thu Jul 08, 2012  3:59 PM ------      Message from: Arvilla Meres P      Created: Thu Jul 08, 2012 12:29 PM       Patient had tb skin test on 06/02/12. [facility] is requesting the paper work be completed for this patient. ------

## 2012-07-08 NOTE — Telephone Encounter (Signed)
Form was faxed at 4:00 pm to [facility] @ [phone] with OV from 04/21/2012. Form sent to Amy (admin staff that handles form charges) and then to scanning

## 2012-08-06 ENCOUNTER — Encounter: Payer: Self-pay | Admitting: Internal Medicine

## 2012-08-06 ENCOUNTER — Telehealth: Payer: Self-pay | Admitting: Internal Medicine

## 2012-08-06 ENCOUNTER — Ambulatory Visit (INDEPENDENT_AMBULATORY_CARE_PROVIDER_SITE_OTHER): Payer: Medicare Other | Admitting: Internal Medicine

## 2012-08-06 VITALS — BP 126/82 | HR 97 | Temp 98.0°F | Wt 193.0 lb

## 2012-08-06 DIAGNOSIS — M25571 Pain in right ankle and joints of right foot: Secondary | ICD-10-CM

## 2012-08-06 DIAGNOSIS — R609 Edema, unspecified: Secondary | ICD-10-CM

## 2012-08-06 DIAGNOSIS — M25579 Pain in unspecified ankle and joints of unspecified foot: Secondary | ICD-10-CM

## 2012-08-06 MED ORDER — TRAMADOL HCL 50 MG PO TABS: 50.0000 mg | ORAL_TABLET | Freq: Four times a day (QID) | ORAL | Status: DC | PRN

## 2012-08-06 NOTE — Progress Notes (Signed)
  Subjective:    Patient ID: Mary Roth, female    DOB: 1928/10/04, 79 y.o.   MRN: [id number]  HPI The pain & swelling began 3/23   @ the L ankle  without associated injury or trigger. It is described as aching up to level 5 The pain does not radiate.  The discomfort lasts constantly as "nag". It is exacerbated by ambulation .  Associated signs/symptoms include redness , stiffness ,& skin  temperature change. The pain was treated with topical cream &  ice  ; response was partial . PMH of L foot fracture 1973.         Review of Systems Constitutional: no fever, chills, sweats, change in weight  Musculoskeletal:no  muscle cramps or pain Neuro: no weakness; incontinence (stool/urine); numbness and tingling Heme:no lymphadenopathy; abnormal bruising or bleeding       Objective:   Physical Exam Gen.: Well-nourished in appearance. Alert, appropriate and cooperative throughout exam.  Musculoskeletal/extremities: There is visible asymmetric swelling of the ankles; there is significant swelling on the left particularly around the medial malleolus. Pes planus is present.  Extension of foot causes discomfort across the anterior ankle. There is no significant pain with inversion, eversion, or flexion. There is some discomfort to compression of the dorsal foot. There is also marked discomfort with palpation of the soft tissues below the right medial malleolar area  Vascular: dorsalis pedis and  posterior tibial pulses are full and equal. No bruits present. Neurologic: Alert and oriented x3. Deep tendon reflexes symmetrical and normal.Strength & tone normal. Skin: Intact without suspicious lesions or rashes. There is no significant erythema but there is blanching with pressure over the skin of the ankle. This area is cool to touch Psych: Mood and affect are normal. Normally interactive                                                                                      Assessment & Plan:   #1 ankle pain & swelling ; soft tissue process suggested See orders

## 2012-08-06 NOTE — Telephone Encounter (Signed)
Per Dr.Hopper if appointment scheduled ok to close encounter

## 2012-08-06 NOTE — Telephone Encounter (Signed)
Patient Information:  Caller Name: Audriana  Phone: [phone]  Patient: Mary, Roth  Gender: Female  DOB: Sep 15, 1928  Age: 79 Years  PCP: Marga Melnick  Office Follow Up:  Does the office need to follow up with this patient?: No  Instructions For The Office: N/A  RN Note:  No known injury. Advised to elevate > heart and stay off foot. Declined earlier appointment with another provider; prefers to see Dr Alwyn Ren at 1400 08/06/12.   Symptoms  Reason For Call & Symptoms: Left ankle swelling, moderate ain and distended blood vessels on medial aspect.  Reviewed Health History In EMR: Yes  Reviewed Medications In EMR: Yes  Reviewed Allergies In EMR: Yes  Reviewed Surgeries / Procedures: Yes  Date of Onset of Symptoms: 08/03/2012  Treatments Tried: ice treatments, topical Asper cream  Treatments Tried Worked: No  Guideline(s) Used:  Foot Pain  Ankle Pain  Disposition Per Guideline:   Go to Office Now  Reason For Disposition Reached:   Looks infected (spreading redness, pus) and large red area (> 2 in. or 5 cm)  Advice Given:  Call Back If:  Moderate pain (e.g. limping) lasts over 3 days  You become worse.  Patient Will Follow Care Advice:  YES  Appointment Scheduled:  08/06/2012 14:00:00 Appointment Scheduled Provider:  Marga Melnick

## 2012-08-06 NOTE — Patient Instructions (Addendum)
Use an anti-inflammatory cream such as Aspercreme or Zostrix cream twice a day to the ankle as needed. In lieu of this warm moist compresses or  hot water bottle can be used. Do not apply ice .

## 2012-08-07 ENCOUNTER — Encounter (HOSPITAL_BASED_OUTPATIENT_CLINIC_OR_DEPARTMENT_OTHER): Payer: Self-pay | Admitting: Emergency Medicine

## 2012-08-07 ENCOUNTER — Emergency Department (HOSPITAL_BASED_OUTPATIENT_CLINIC_OR_DEPARTMENT_OTHER)
Admission: EM | Admit: 2012-08-07 | Discharge: 2012-08-07 | Disposition: A | Discharge status: Home / Self Care | Payer: Medicare Other | Attending: Emergency Medicine | Admitting: Emergency Medicine

## 2012-08-07 ENCOUNTER — Emergency Department (HOSPITAL_BASED_OUTPATIENT_CLINIC_OR_DEPARTMENT_OTHER): Payer: Medicare Other

## 2012-08-07 DIAGNOSIS — Z860100 Personal history of colon polyps, unspecified: Secondary | ICD-10-CM | POA: Insufficient documentation

## 2012-08-07 DIAGNOSIS — E039 Hypothyroidism, unspecified: Secondary | ICD-10-CM | POA: Insufficient documentation

## 2012-08-07 DIAGNOSIS — M7732 Calcaneal spur, left foot: Secondary | ICD-10-CM

## 2012-08-07 DIAGNOSIS — M899 Disorder of bone, unspecified: Secondary | ICD-10-CM | POA: Insufficient documentation

## 2012-08-07 DIAGNOSIS — M129 Arthropathy, unspecified: Secondary | ICD-10-CM | POA: Insufficient documentation

## 2012-08-07 DIAGNOSIS — Z8719 Personal history of other diseases of the digestive system: Secondary | ICD-10-CM | POA: Insufficient documentation

## 2012-08-07 DIAGNOSIS — M7730 Calcaneal spur, unspecified foot: Secondary | ICD-10-CM | POA: Insufficient documentation

## 2012-08-07 DIAGNOSIS — M949 Disorder of cartilage, unspecified: Secondary | ICD-10-CM | POA: Insufficient documentation

## 2012-08-07 DIAGNOSIS — M25473 Effusion, unspecified ankle: Secondary | ICD-10-CM | POA: Insufficient documentation

## 2012-08-07 DIAGNOSIS — M25476 Effusion, unspecified foot: Secondary | ICD-10-CM | POA: Insufficient documentation

## 2012-08-07 DIAGNOSIS — J45909 Unspecified asthma, uncomplicated: Secondary | ICD-10-CM | POA: Insufficient documentation

## 2012-08-07 DIAGNOSIS — K219 Gastro-esophageal reflux disease without esophagitis: Secondary | ICD-10-CM | POA: Insufficient documentation

## 2012-08-07 DIAGNOSIS — K7689 Other specified diseases of liver: Secondary | ICD-10-CM | POA: Insufficient documentation

## 2012-08-07 DIAGNOSIS — I10 Essential (primary) hypertension: Secondary | ICD-10-CM | POA: Insufficient documentation

## 2012-08-07 DIAGNOSIS — Z79899 Other long term (current) drug therapy: Secondary | ICD-10-CM | POA: Insufficient documentation

## 2012-08-07 DIAGNOSIS — M25472 Effusion, left ankle: Secondary | ICD-10-CM

## 2012-08-07 DIAGNOSIS — E785 Hyperlipidemia, unspecified: Secondary | ICD-10-CM | POA: Insufficient documentation

## 2012-08-07 NOTE — ED Notes (Signed)
Pt states she is having left ankle pain and swelling for several days.  Seen by PCP yesterday and referred to orthopedics.  Pt concerned that about the pain/swelling and whether or not she should walk on the foot.

## 2012-08-07 NOTE — ED Provider Notes (Signed)
History     CSN: [id number]  Arrival date & time 08/07/12  1217   First MD Initiated Contact with Patient 08/07/12 1239      Chief Complaint  Patient presents with  . Ankle Pain  . Joint Swelling    (Consider location/radiation/quality/duration/timing/severity/associated sxs/prior treatment) Patient is a 79 y.o. female presenting with ankle pain. The history is provided by the patient. No language interpreter was used.  Ankle Pain Location:  Foot and ankle Time since incident:  1 week Ankle location:  L ankle Foot location:  L foot Pain details:    Quality:  Aching   Radiates to:  Does not radiate   Severity:  Moderate   Onset quality:  Gradual   Timing:  Constant   Progression:  Worsening Chronicity:  New Worsened by:  Activity Pt saw Dr. Alwyn Ren.   He plans to send pt to orthopaedist.  Pt denies any injury  Past Medical History  Diagnosis Date  . Personal history of colonic polyps 08/21/2006    ADENOMATOUS POLYP  . Fatty liver   . Hepatic steatosis   . Abnormal liver function test   . Mastodynia   . Unspecified essential hypertension   . Other and unspecified hyperlipidemia   . Esophageal reflux   . Unspecified asthma 2003     no childhood asthma  . Edema   . Unspecified hypothyroidism   . Arthritis   . Osteopenia 03/2012    T score -1.4 FRAX 20%/11%; Dr Audie Box    Past Surgical History  Procedure Laterality Date  . Appendectomy    . Cholecystectomy  1973     for stones  . Tonsillectomy    . Uterine suspension      Family History  Problem Relation Age of Onset  . Uterine cancer Mother   . Ovarian cancer Mother   . Heart attack Father 24  . Prostate cancer Father   . Breast cancer Sister     x 3 sisters  . Asthma Sister   . Alcohol abuse Brother     x 3  . Prostate cancer Brother     x 3 brothers  . Pancreatic cancer Brother     x 2  . Diabetes Neg Hx   . Stroke Neg Hx     History  Substance Use Topics  . Smoking status: Never Smoker    . Smokeless tobacco: Never Used  . Alcohol Use: 2.4 oz/week    4 Shots of liquor per week     Comment:  socially    OB History   Grav Para Term Preterm Abortions TAB SAB Ect Mult Living   2 2 2       2       Review of Systems  All other systems reviewed and are negative.    Allergies  Ciprofloxacin and Phenergan  Home Medications   Current Outpatient Rx  Name  Route  Sig  Dispense  Refill  . albuterol (ACCUNEB) 0.63 MG/3ML nebulizer solution   Nebulization   Take 3 mLs (0.63 mg total) by nebulization every 4 (four) hours as needed.   75 mL   1   . albuterol (PROVENTIL) (2.5 MG/3ML) 0.083% nebulizer solution   Nebulization   Take 3 mLs (2.5 mg total) by nebulization every 4 (four) hours as needed for wheezing.   90 mL   3   . beclomethasone (QVAR) 80 MCG/ACT inhaler   Inhalation   Inhale 2 puffs into the lungs 2 (two)  times daily.         . Calcium Carbonate-Vit D-Min (CALTRATE 600+D PLUS PO)   Oral   Take by mouth daily.         . clonazePAM (KLONOPIN) 0.5 MG tablet   Oral   Take 0.5 mg by mouth at bedtime as needed.           . CRESTOR 5 MG tablet      TAKE 1 TABLET BY MOUTH ONCE A DAY   30 tablet   11   . DETROL LA 2 MG 24 hr capsule      TAKE 1 CAPSULE BY MOUTH ONCE A DAY   30 capsule   5   . fexofenadine (ALLEGRA) 180 MG tablet   Oral   Take 180 mg by mouth daily as needed.           . Fluticasone-Salmeterol (ADVAIR DISKUS) 250-50 MCG/DOSE AEPB               . levothyroxine (SYNTHROID, LEVOTHROID) 88 MCG tablet   Oral   Take 1 tablet (88 mcg total) by mouth daily.   90 tablet   3   . montelukast (SINGULAIR) 10 MG tablet      TAKE 1 TABLET BY MOUTH ONCE A DAY AS NEEDED   90 tablet   1   . NEXIUM 40 MG capsule      TAKE 1 CAPSULE BY MOUTH ONCE A DAY   30 capsule   5   . spironolactone (ALDACTONE) 25 MG tablet      TAKE 1 TABLET BY MOUTH ONCE A DAY   90 tablet   2   . traMADol (ULTRAM) 50 MG tablet   Oral   Take  1 tablet (50 mg total) by mouth every 6 (six) hours as needed for pain.   30 tablet   0     BP 140/66  Pulse 76  Temp(Src) 98.2 F (36.8 C)  Resp 18  Ht 4' 11.5" (1.511 m)  Wt 193 lb 4 oz (87.658 kg)  BMI 38.39 kg/m2  SpO2 96%  Physical Exam  Nursing note and vitals reviewed. Constitutional: She appears well-developed and well-nourished.  HENT:  Head: Normocephalic and atraumatic.  Eyes: Pupils are equal, round, and reactive to light.  Musculoskeletal: She exhibits tenderness.  Swollen left foot and ankle,  No erythema,   No pitting,   Right foot is not swollen,  From,  No pain to palpation  Neurological: She is alert.  Skin: Skin is warm.  Psychiatric: She has a normal mood and affect.    ED Course  Procedures (including critical care time)  Labs Reviewed - No data to display Dg Ankle Complete Left  08/07/2012  *RADIOLOGY REPORT*  Clinical Data: Increasing pain with swelling.  LEFT ANKLE COMPLETE - 3+ VIEW  Comparison: None.  Findings: Three views of the left ankle are negative for acute fracture or dislocation.  There is a prominent spur along the plantar aspect of the calcaneus.  There is soft tissue swelling along the medial aspect of the lower leg and ankle.  IMPRESSION: Medial soft tissue swelling.  No acute bony abnormality.   Original Report Authenticated By: Richarda Overlie, M.D.      1. Calcaneal spur of foot, left   2. Ankle swelling, left       MDM  Xray shows bone spur.   Pt wrapped in ace,  Post op shoe.   I advised see the Orthopaedist for  evaluation        Elson Areas, PA-C 08/07/12 1656  Elson Areas, PA-C 08/07/12 1657

## 2012-08-08 NOTE — ED Provider Notes (Signed)
Medical screening examination/treatment/procedure(s) were performed by non-physician practitioner and as supervising physician I was immediately available for consultation/collaboration.   Carleene Cooper III, MD 08/08/12 [phone]

## 2012-08-20 ENCOUNTER — Other Ambulatory Visit: Payer: Self-pay | Admitting: Internal Medicine

## 2012-10-01 ENCOUNTER — Other Ambulatory Visit: Payer: Self-pay | Admitting: Internal Medicine

## 2012-10-07 ENCOUNTER — Ambulatory Visit (INDEPENDENT_AMBULATORY_CARE_PROVIDER_SITE_OTHER): Payer: Medicare Other | Admitting: Internal Medicine

## 2012-10-07 ENCOUNTER — Encounter: Payer: Self-pay | Admitting: Lab

## 2012-10-07 ENCOUNTER — Encounter: Payer: Self-pay | Admitting: Internal Medicine

## 2012-10-07 VITALS — BP 122/78 | HR 99 | Temp 98.3°F | Wt 192.0 lb

## 2012-10-07 DIAGNOSIS — J45901 Unspecified asthma with (acute) exacerbation: Secondary | ICD-10-CM

## 2012-10-07 DIAGNOSIS — J4531 Mild persistent asthma with (acute) exacerbation: Secondary | ICD-10-CM

## 2012-10-07 DIAGNOSIS — R300 Dysuria: Secondary | ICD-10-CM

## 2012-10-07 LAB — POCT URINALYSIS DIPSTICK
Protein, UA: NEGATIVE
Urobilinogen, UA: 0.2

## 2012-10-07 MED ORDER — PREDNISONE 20 MG PO TABS: 20.0000 mg | ORAL_TABLET | Freq: Two times a day (BID) | ORAL | Status: DC

## 2012-10-07 MED ORDER — CEFUROXIME AXETIL 500 MG PO TABS: 500.0000 mg | ORAL_TABLET | Freq: Two times a day (BID) | ORAL | Status: DC

## 2012-10-07 NOTE — Addendum Note (Signed)
Addended by: Maurice Small on: 10/07/2012 02:09 PM   Modules accepted: Orders

## 2012-10-07 NOTE — Patient Instructions (Addendum)
Plain Mucinex (NOT D) for thick secretions ;force NON dairy fluids .Plain Allegra (NOT D )  160 daily , Loratidine 10 mg , OR Zyrtec 10 mg @ bedtime  as needed for itchy eyes & sneezing. 

## 2012-10-07 NOTE — Progress Notes (Signed)
  Subjective:    Patient ID: Mary Roth, female    DOB: December 20, 1928, 79 y.o.   MRN: [id number]  HPI Pt here after 3 weeks of coughing, shortness of breath, and wheezing.  Cough was productive with green sputum.  No associated fever.  Pt admits to taking 500 mg amoxicillin on Saturday and Sunday.  Pt feels she has been coughing up less sputum since, and cough is less frequent.  However, pt is complaining of back pain with coughing.  Her major symptom is difficulty clearing her throat due to congestion. She makes a statement "the cough is wearing me out"      Review of Systems Denies ear fullness, PND, nasal congestion, sore throat, headache, rash.  Has not been around any sick contacts.  She's had intermittent dysuria for one week without hematuria or pyuria     Objective:   Physical Exam    She appears somewhat short of breath despite the excellent O2 sats. She does not have clubbing or cyanosis.  ENT exam is unremarkable except for upper airway wheezing. There is no oral pharyngeal compromise, lesions, or exudate  The chest is surprisingly clear without wheezing.  Heart sounds are somewhat distant. There is some respiratory variation to the heart rate/rhythm.  Mild lipedema is present        Assessment & Plan:  #1 asthmatic bronchitis with some upper airway compromise due to secretions  #2 muscular back pain related to cough  #3 dysuria  Plan see orders / recommendations

## 2012-10-09 LAB — URINE CULTURE: Colony Count: 75000

## 2012-11-10 ENCOUNTER — Ambulatory Visit (INDEPENDENT_AMBULATORY_CARE_PROVIDER_SITE_OTHER): Payer: Medicare Other | Admitting: Internal Medicine

## 2012-11-10 ENCOUNTER — Telehealth: Payer: Self-pay

## 2012-11-10 ENCOUNTER — Encounter: Payer: Self-pay | Admitting: Internal Medicine

## 2012-11-10 VITALS — BP 126/80 | HR 88 | Temp 98.3°F | Wt 192.0 lb

## 2012-11-10 DIAGNOSIS — R61 Generalized hyperhidrosis: Secondary | ICD-10-CM

## 2012-11-10 DIAGNOSIS — M25469 Effusion, unspecified knee: Secondary | ICD-10-CM

## 2012-11-10 DIAGNOSIS — M79609 Pain in unspecified limb: Secondary | ICD-10-CM

## 2012-11-10 DIAGNOSIS — IMO0001 Reserved for inherently not codable concepts without codable children: Secondary | ICD-10-CM

## 2012-11-10 DIAGNOSIS — R0600 Dyspnea, unspecified: Secondary | ICD-10-CM

## 2012-11-10 DIAGNOSIS — M1710 Unilateral primary osteoarthritis, unspecified knee: Secondary | ICD-10-CM

## 2012-11-10 DIAGNOSIS — M25461 Effusion, right knee: Secondary | ICD-10-CM

## 2012-11-10 DIAGNOSIS — R0989 Other specified symptoms and signs involving the circulatory and respiratory systems: Secondary | ICD-10-CM

## 2012-11-10 DIAGNOSIS — M79661 Pain in right lower leg: Secondary | ICD-10-CM

## 2012-11-10 DIAGNOSIS — M1711 Unilateral primary osteoarthritis, right knee: Secondary | ICD-10-CM

## 2012-11-10 LAB — D-DIMER, QUANTITATIVE: D-Dimer, Quant: 0.86 ug/mL-FEU — ABNORMAL HIGH (ref 0.00–0.48)

## 2012-11-10 MED ORDER — MELOXICAM 7.5 MG PO TABS: 7.5000 mg | ORAL_TABLET | Freq: Every day | ORAL | Status: DC

## 2012-11-10 NOTE — Addendum Note (Signed)
Addended by: Maurice Small on: 11/10/2012 04:31 PM   Modules accepted: Orders

## 2012-11-10 NOTE — Telephone Encounter (Signed)
I called patient again, patient informed of elevated D-Dimer, patient aware that she needs to go to [facility] for Venous U/S. Patient was informed of address and verbalized understanding of location

## 2012-11-10 NOTE — Progress Notes (Signed)
Subjective:    Patient ID: Mary Roth, female    DOB: 1928-10-15, 79 y.o.   MRN: [id number]  HPI   Symptoms began as pain in the right knee 5 days ago without trigger or injury. Aspercreme, heat, rest and altered position with a pillow have been of some benefit.  It does increase with walking up to a level IX. There's been tenderness to palpation  She has no past medical history of orthopedic problems in the right knee.  In the last few days she's had episodes of paroxysmal cold sweats    Review of Systems  She denies swelling or redness of the knee.  She denies fever or chills. She's had no associated rashes.  She's had no localizing signs of infection such as purulent secretions from her head or chest; dysuria; pyuria; or diarrhea.  She's had intermittent shortness of breath in the context of her asthma over the last several months    Objective:   Physical Exam Gen.: well-nourished in appearance. Alert, appropriate and cooperative throughout exam.Uncomfortable  Eyes: No corneal or conjunctival inflammation noted.  Ears: External  ear exam reveals no significant lesions or deformities. Canals clear .TMs normal. Hearing is grossly normal bilaterally. Nose: External nasal exam reveals no deformity or inflammation. Nasal mucosa are pink and moist. No lesions or exudates noted.  Mouth: Oral mucosa and oropharynx reveal no lesions or exudates. Teeth in good repair. Neck: No deformities, masses, or tenderness noted. Range of motion & Thyroid normal. Lungs: Normal respiratory effort; chest expands symmetrically. Lungs are clear to auscultation without rales, wheezes, or increased work of breathing. Heart: Normal rate and rhythm. Normal S1 and S2. No gallop, click, or rub. S4 w/o murmur. Abdomen: Bowel sounds normal; abdomen soft and nontender. No masses, organomegaly or hernias noted.                                Musculoskeletal/extremities:Accentuated curvature of upper  thoracic  Spine.  No clubbing or cyanosis. 1/2+ L ankle edema noted. Range of motion decreased R knee ;crepitus and small effusion present . Tender to palpation.Tone & strength  Normal. Joints  reveal mild  DJD DIP changes. Nail health good. She limps related to the pain in the right knee. She needs help getting on the exam table Negative SLR bilaterally .  She has an equivocally positive Homans in the right calf. This is negative on the left  Neurologic: Alert and oriented x3. Deep tendon reflexes normal ; R knee not tested         Skin: Intact without suspicious lesions or rashes. Lymph: No cervical, axillary lymphadenopathy present. Psych: Mood and affect are normal. Normally interactive                                                                                        Assessment & Plan:  #1 degenerative joint disease right knee with effusion  #2 paroxysmal cold sweats with no historical or physical localizing signs of infection.  #3 equivocal Homans right lower extremity; this is most likely related to a popliteal cyst in  the context of the degenerative joint disease of the right knee  Plan: See orders and recommendations

## 2012-11-10 NOTE — Telephone Encounter (Signed)
Left message on voicemail for patient to return call when available.  Other number on file for patient is a invalid number

## 2012-11-10 NOTE — Patient Instructions (Addendum)
Use an anti-inflammatory cream such as Aspercreme or Zostrix cream twice a day to the  knee as needed. In lieu of this warm moist compresses or  hot water bottle can be used. Do not apply ice. Consider glucosamine sulfate 1500 mg daily for joint symptoms. Take this daily  for 4 weeks . This will rehydrate the cartilages.

## 2012-11-17 ENCOUNTER — Telehealth: Payer: Self-pay | Admitting: Internal Medicine

## 2012-11-17 NOTE — Telephone Encounter (Signed)
Received 2 pages from [employer], sent to Dr. Alwyn Ren at [facility]. 11/17/12/ss

## 2012-12-03 ENCOUNTER — Encounter: Payer: Self-pay | Admitting: Internal Medicine

## 2012-12-15 ENCOUNTER — Other Ambulatory Visit: Payer: Self-pay

## 2013-02-03 ENCOUNTER — Ambulatory Visit (INDEPENDENT_AMBULATORY_CARE_PROVIDER_SITE_OTHER): Payer: Medicare Other | Admitting: Internal Medicine

## 2013-02-03 ENCOUNTER — Encounter: Payer: Self-pay | Admitting: Internal Medicine

## 2013-02-03 VITALS — BP 133/80 | HR 90 | Temp 98.8°F | Wt 191.6 lb

## 2013-02-03 DIAGNOSIS — J45901 Unspecified asthma with (acute) exacerbation: Secondary | ICD-10-CM

## 2013-02-03 DIAGNOSIS — R062 Wheezing: Secondary | ICD-10-CM

## 2013-02-03 DIAGNOSIS — J209 Acute bronchitis, unspecified: Secondary | ICD-10-CM

## 2013-02-03 DIAGNOSIS — J4541 Moderate persistent asthma with (acute) exacerbation: Secondary | ICD-10-CM

## 2013-02-03 DIAGNOSIS — J309 Allergic rhinitis, unspecified: Secondary | ICD-10-CM

## 2013-02-03 MED ORDER — AMOXICILLIN 500 MG PO CAPS: 500.0000 mg | ORAL_CAPSULE | Freq: Three times a day (TID) | ORAL | Status: DC

## 2013-02-03 MED ORDER — ALBUTEROL SULFATE (5 MG/ML) 0.5% IN NEBU
2.5000 mg | INHALATION_SOLUTION | Freq: Once | RESPIRATORY_TRACT | Status: AC
  Administered 2013-02-03: 2.5 mg via RESPIRATORY_TRACT

## 2013-02-03 MED ORDER — PREDNISONE 20 MG PO TABS: 20.0000 mg | ORAL_TABLET | Freq: Two times a day (BID) | ORAL | Status: DC

## 2013-02-03 MED ORDER — IPRATROPIUM BROMIDE 0.02 % IN SOLN
0.5000 mg | Freq: Once | RESPIRATORY_TRACT | Status: AC
  Administered 2013-02-03: 0.5 mg via RESPIRATORY_TRACT

## 2013-02-03 MED ORDER — FLUTICASONE PROPIONATE 50 MCG/ACT NA SUSP: 1.0000 | Freq: Two times a day (BID) | NASAL | Status: DC | PRN

## 2013-02-03 NOTE — Progress Notes (Signed)
  Subjective:    Patient ID: Mary Roth, female    DOB: 10/06/1928, 79 y.o.   MRN: [id number]  HPI  Symptoms began 01/30/13 after [place] She did have some itchy, watery eyes, and sneezing. She also had head congestion; fever, chills, and sweats. Her temperature was up to 100. She describes the production of yellow sputum associated with shortness of breath and wheezing especially @ night.  Initially she had some sore throat which has resolved. She also had some dental pain.  She continues to use her Advair. She has not used her nebulizer as she did not feel exacerbation was severe enough. She has continued her generic Singulair.  She's only here at the urging of her daughter; she repeatedly says "it's not that bad".   Review of Systems  She denies frontal headache, facial pain, nasal purulence. She also has no otic pain or discharge.     Objective:   Physical Exam General appearance:adequately nourished; no acute distress or increased work of breathing is present but audible wheezing @ 3 feet.  No  lymphadenopathy about the head, neck, or axilla noted.   Eyes: No conjunctival inflammation or lid edema is present.   Ears:  External ear exam shows no significant lesions or deformities.  Otoscopic examination reveals clear canals, tympanic membranes are intact bilaterally without bulging, retraction, inflammation or discharge.  Nose:  External nasal examination shows no deformity or inflammation. Nasal mucosa are pink and moist without lesions or exudates. No septal dislocation or deviation.No obstruction to airflow.   Oral exam: Dental hygiene is good; lips and gums are healthy appearing.There is no oropharyngeal erythema or exudate noted.   Neck:  No deformities, masses, or tenderness noted.   Upper airway wheezes . No neck vein distention at 0.  Heart:  Normal rate and regular rhythm. S1 and S2 normal without gallop, murmur, click,  rub or other extra sounds. Heart sounds are distant and partially secured by the diffuse wheezing  Lungs: Diffuse musical wheezing in all lung fields essentially homogeneous.  Abdomen reveals no organomegaly, masses, or tenderness. No hepatojugular reflux present  Extremities:  No cyanosis, edema, or clubbing  noted . Mary Roth arthritic changes   Skin: Warm & dry .         Assessment & Plan:  See Current Assessment & Plan in Problem List under specific Diagnosis

## 2013-02-03 NOTE — Patient Instructions (Addendum)
Plain Mucinex (NOT D) for thick secretions ;force NON dairy fluids .   Nasal cleansing in the shower as discussed with lather of mild shampoo.After 10 seconds wash off lather while  exhaling through nostrils. Make sure that all residual soap is removed to prevent irritation.  Fluticasone 1 spray in each nostril twice a day as needed. Use the "crossover" technique into opposite nostril spraying toward opposite ear @ 45 degree angle, not straight up into nostril.  Use a Neti pot daily only  as needed for significant sinus congestion; going from open side to congested side . Plain Allegra (NOT D )  160 daily , Loratidine 10 mg , OR Zyrtec 10 mg @ bedtime  as needed for itchy eyes & sneezing.  Use the Advair 500/50 one inhalation every 12 hours over the next week. Gargle and spit after use.  Use of nebulized albuterol every 4-6 hours if you or wheezing.  A chest x-ray is indicated if you are not dramatically better over the next 24-36 hours.

## 2013-02-03 NOTE — Assessment & Plan Note (Signed)
Significant bronchospasm; subjective improvement with nebulized albuterol. The wheezing is homogeneous in all lung fields mitigate against underlying pneumonia. No evidence of cardiac component to wheezing.  Of concern is that she tends to be somewhat unconcerned about the severity of her symptoms.

## 2013-02-07 ENCOUNTER — Telehealth: Payer: Self-pay | Admitting: *Deleted

## 2013-02-07 ENCOUNTER — Ambulatory Visit (INDEPENDENT_AMBULATORY_CARE_PROVIDER_SITE_OTHER)
Admission: RE | Admit: 2013-02-07 | Discharge: 2013-02-07 | Disposition: A | Discharge status: Home / Self Care | Payer: Medicare Other | Source: Ambulatory Visit | Attending: Internal Medicine | Admitting: Internal Medicine

## 2013-02-07 ENCOUNTER — Other Ambulatory Visit: Payer: Self-pay | Admitting: *Deleted

## 2013-02-07 ENCOUNTER — Other Ambulatory Visit: Payer: Self-pay | Admitting: Internal Medicine

## 2013-02-07 DIAGNOSIS — J441 Chronic obstructive pulmonary disease with (acute) exacerbation: Secondary | ICD-10-CM

## 2013-02-07 DIAGNOSIS — J45901 Unspecified asthma with (acute) exacerbation: Secondary | ICD-10-CM | POA: Insufficient documentation

## 2013-02-07 NOTE — Telephone Encounter (Signed)
Order for x-rays entered into  the computer; these will be performed at [address] across from [facility]. No appointment is necessary. 

## 2013-02-07 NOTE — Telephone Encounter (Signed)
Patient called and stated that her productive cough and wheezing persists and asked if a CXR is needed now? Please advise.

## 2013-02-07 NOTE — Telephone Encounter (Signed)
Called and informed patient of CXR order. She stated her understanding.

## 2013-02-17 ENCOUNTER — Ambulatory Visit: Payer: Medicare Other

## 2013-03-17 ENCOUNTER — Other Ambulatory Visit: Payer: Self-pay

## 2013-04-28 ENCOUNTER — Telehealth: Payer: Self-pay | Admitting: *Deleted

## 2013-04-28 NOTE — Telephone Encounter (Signed)
OK X1 

## 2013-04-28 NOTE — Telephone Encounter (Signed)
rx request - clonazepam 0.5mg  Last ov- 02/03/13 Last refilled - 10/07/12 historical provider NO UDS on file

## 2013-04-29 MED ORDER — CLONAZEPAM 0.5 MG PO TABS: 0.5000 mg | ORAL_TABLET | Freq: Every evening | ORAL | Status: DC | PRN

## 2013-04-29 NOTE — Addendum Note (Signed)
Addended by: Verdie Shire on: 04/29/2013 10:50 AM   Modules accepted: Orders

## 2013-04-29 NOTE — Telephone Encounter (Signed)
Rx printed and faxed to pharmacy.//AB/CMA

## 2013-05-03 ENCOUNTER — Telehealth: Payer: Self-pay

## 2013-05-03 NOTE — Telephone Encounter (Signed)
Medication and allergies:  Reviewed and updated  90 day supply/mail order: na Local pharmacy: [facility]   Immunizations due:  tdap?  A/P:   No changes to FH or PSH or personal hx MMG--03/2011--neg Bone Density--04/2012 [facility]--02/2012--Dr Patterson--follow up routine guidelines Flu vaccine--03/2013 [facility]--2004 Shingles--03/2010  To Discuss with Provider: Don't fuss if she gained a little weight since it's the holidays!!

## 2013-05-06 ENCOUNTER — Encounter: Payer: Self-pay | Admitting: Internal Medicine

## 2013-05-06 ENCOUNTER — Ambulatory Visit (INDEPENDENT_AMBULATORY_CARE_PROVIDER_SITE_OTHER): Payer: Medicare Other | Admitting: Internal Medicine

## 2013-05-06 VITALS — BP 125/74 | HR 83 | Temp 98.2°F | Resp 16 | Ht <= 58 in | Wt 195.0 lb

## 2013-05-06 DIAGNOSIS — K219 Gastro-esophageal reflux disease without esophagitis: Secondary | ICD-10-CM

## 2013-05-06 DIAGNOSIS — R0609 Other forms of dyspnea: Secondary | ICD-10-CM

## 2013-05-06 DIAGNOSIS — Z0000 Encounter for general adult medical examination without abnormal findings: Secondary | ICD-10-CM

## 2013-05-06 DIAGNOSIS — E785 Hyperlipidemia, unspecified: Secondary | ICD-10-CM

## 2013-05-06 DIAGNOSIS — M899 Disorder of bone, unspecified: Secondary | ICD-10-CM

## 2013-05-06 DIAGNOSIS — R0600 Dyspnea, unspecified: Secondary | ICD-10-CM

## 2013-05-06 DIAGNOSIS — I10 Essential (primary) hypertension: Secondary | ICD-10-CM

## 2013-05-06 DIAGNOSIS — R079 Chest pain, unspecified: Secondary | ICD-10-CM

## 2013-05-06 DIAGNOSIS — E039 Hypothyroidism, unspecified: Secondary | ICD-10-CM

## 2013-05-06 NOTE — Progress Notes (Signed)
Pre-visit discussion using our clinic review tool. No additional management support is needed unless otherwise documented below in the visit note.  

## 2013-05-06 NOTE — Progress Notes (Signed)
Subjective:    Patient ID: Mary Roth, female    DOB: Feb 27, 1929, 79 y.o.   MRN: [id number]  HPI  Medicare Wellness Visit: Psychosocial and medical history were reviewed as required by Medicare (history related to abuse, antisocial behavior , firearm risk). Social history: Caffeine: none , Alcohol: 4 drinks , Tobacco WUJ:WJXBJ Exercise: minmal Personal safety/fall risk:no Limitations of activities of daily living:no Seatbelt/ smoke alarm use:yes Healthcare Power of Attorney/Living Will status: in place Ophthalmologic exam status:current Hearing evaluation status:not current Orientation: Oriented X 3 Memory and recall: good Spelling  testing: good Depression/anxiety assessment: no Foreign travel history:[place] 2012 Immunization status for influenza/pneumonia/ shingles /tetanus: current Transfusion history:no Preventive health care maintenance status: Colonoscopy/BMD/mammogram/Pap as per protocol/standard care:current Dental care:overdue Chart reviewed and updated. Active issues reviewed and addressed as documented below.    Review of Systems  Over the past 3 months she's noticed increased exertional dyspnea walking a block or less or using a vacuum cleaner. She's also had sharp, piercing chest pain both at rest and with exertion rarely. She does describe this as mainly in the left posterior thorax and occasionally the left arm.  She has squeezing & heaviness in the anterior chest.Taking one of her husband's nitroglycerin did help the squeezing discomfort. She can have cold sweats with this but has not had nausea.  Her asthma did not start until her early 35s. She questions whether her dyspnea symptoms which are progressing may be cardiac rather than related to  reactive airways disease   She also questions whether the Nexium is causing watery bowel movements , on average this occurs 3 times per week. Stool also has increased mucus. She does have history of diverticulosis.       Objective:   Physical Exam  Gen.: Adequately nourished in appearance; central weight excess. Alert, appropriate and cooperative throughout exam.  Head: Normocephalic without obvious abnormalities Eyes: No corneal or conjunctival inflammation noted. Pupils equal round reactive to light and accommodation. Extraocular motion intact. Ears: External  ear exam reveals no significant lesions or deformities. Canals clear .TMs normal. Hearing is grossly normal bilaterally. Nose: External nasal exam reveals no deformity or inflammation. Nasal mucosa are pink and moist. No lesions or exudates noted.   Mouth: Oral mucosa and oropharynx reveal no lesions or exudates. Teeth in good repair. Neck: No deformities, masses, or tenderness noted. Range of motion & Thyroid normal. Lungs: Normal respiratory effort; chest expands symmetrically. Lungs are clear to auscultation without rales, wheezes, or increased work of breathing. Heart: Normal rate and rhythm. Normal S1 and S2. No gallop, click, or rub. S4 w/o murmur. Abdomen: Bowel sounds normal; abdomen soft and nontender. No masses, organomegaly or hernias noted. Genitalia:  as per Gyn                                  Musculoskeletal/extremities: Accentuated curvature of upper thoracic spine. No clubbing, cyanosis, or  edema noted. Range of motion normal .Tone & strength normal. Hand joints  reveal mild  DJD DIP changes. Fingernail health good. Fusiform knees with crepitus. Able to lie down & sit up w/o help. Negative SLR bilaterally Vascular: Carotid, radial artery, dorsalis pedis and  posterior tibial pulses are  equal. Decreased pedal pulses.No bruits present. Neurologic: Alert and oriented x3. Deep tendon reflexes symmetrical and normal.      Skin: Intact without suspicious lesions or rashes. Lymph: No cervical, axillary lymphadenopathy present. Psych: Mood  and affect are normal. Normally interactive                                                                                        Assessment & Plan:  #1 Medicare Wellness Exam; criteria met ; data entered #2 Problem List/Diagnoses reviewed #3 dyspnea #4 atypical chest pain Plan:  Assessments made/ Orders entered

## 2013-05-06 NOTE — Patient Instructions (Addendum)
Your next office appointment will be determined based upon review of your pending labs &/ or x-rays. Those instructions will be transmitted to you through My Chart  or by mail if you're not using this system.  Followup as needed for your this acute issue. Please report any significant change in your symptoms. Cardiology referral made to Dr Tresa Endo.

## 2013-05-07 DIAGNOSIS — E6609 Other obesity due to excess calories: Secondary | ICD-10-CM | POA: Insufficient documentation

## 2013-05-07 DIAGNOSIS — E66812 Obesity, class 2: Secondary | ICD-10-CM | POA: Insufficient documentation

## 2013-05-09 ENCOUNTER — Other Ambulatory Visit (INDEPENDENT_AMBULATORY_CARE_PROVIDER_SITE_OTHER): Payer: Medicare Other

## 2013-05-09 DIAGNOSIS — I10 Essential (primary) hypertension: Secondary | ICD-10-CM

## 2013-05-09 DIAGNOSIS — M899 Disorder of bone, unspecified: Secondary | ICD-10-CM

## 2013-05-09 DIAGNOSIS — E039 Hypothyroidism, unspecified: Secondary | ICD-10-CM

## 2013-05-09 DIAGNOSIS — E785 Hyperlipidemia, unspecified: Secondary | ICD-10-CM

## 2013-05-09 DIAGNOSIS — K219 Gastro-esophageal reflux disease without esophagitis: Secondary | ICD-10-CM

## 2013-05-09 LAB — CBC WITH DIFFERENTIAL/PLATELET
Basophils Absolute: 0.1 10*3/uL (ref 0.0–0.1)
Basophils Relative: 0.9 % (ref 0.0–3.0)
Eosinophils Relative: 4.6 % (ref 0.0–5.0)
HCT: 42.1 % (ref 36.0–46.0)
Hemoglobin: 14 g/dL (ref 12.0–15.0)
Lymphs Abs: 1.7 10*3/uL (ref 0.7–4.0)
Monocytes Relative: 7 % (ref 3.0–12.0)
Neutro Abs: 4.6 10*3/uL (ref 1.4–7.7)
RBC: 4.51 Mil/uL (ref 3.87–5.11)
RDW: 14.3 % (ref 11.5–14.6)

## 2013-05-09 LAB — TSH: TSH: 2.06 u[IU]/mL (ref 0.35–5.50)

## 2013-05-09 LAB — LIPID PANEL: Cholesterol: 199 mg/dL (ref 0–200)

## 2013-05-09 LAB — HEPATIC FUNCTION PANEL
ALT: 17 U/L (ref 0–35)
AST: 19 U/L (ref 0–37)
Albumin: 3.7 g/dL (ref 3.5–5.2)
Alkaline Phosphatase: 67 U/L (ref 39–117)
Total Protein: 6.6 g/dL (ref 6.0–8.3)

## 2013-05-09 LAB — BASIC METABOLIC PANEL
GFR: 69.86 mL/min (ref >60.00)
Glucose, Bld: 86 mg/dL (ref 70–99)
Potassium: 4.1 mEq/L (ref 3.5–5.1)
Sodium: 142 mEq/L (ref 135–145)

## 2013-05-13 ENCOUNTER — Other Ambulatory Visit: Payer: Self-pay | Admitting: Internal Medicine

## 2013-05-13 NOTE — Telephone Encounter (Signed)
Levothyroxine and spironolactone refilled per protocol. JG//CMA

## 2013-05-14 LAB — VITAMIN D 1,25 DIHYDROXY
Vitamin D 1, 25 (OH)2 Total: 38 pg/mL (ref 18–72)
Vitamin D2 1, 25 (OH)2: <8 pg/mL
Vitamin D3 1, 25 (OH)2: 38 pg/mL

## 2013-05-25 ENCOUNTER — Other Ambulatory Visit: Payer: Self-pay | Admitting: Internal Medicine

## 2013-05-25 NOTE — Telephone Encounter (Signed)
Advair diskus refilled per protocol. JG//CMA

## 2013-05-31 ENCOUNTER — Encounter: Payer: Self-pay | Admitting: Women's Health

## 2013-05-31 ENCOUNTER — Ambulatory Visit (INDEPENDENT_AMBULATORY_CARE_PROVIDER_SITE_OTHER): Payer: Medicare Other | Admitting: Women's Health

## 2013-05-31 VITALS — BP 110/90 | Ht <= 58 in | Wt 196.0 lb

## 2013-05-31 DIAGNOSIS — M8580 Other specified disorders of bone density and structure, unspecified site: Secondary | ICD-10-CM

## 2013-05-31 DIAGNOSIS — M899 Disorder of bone, unspecified: Secondary | ICD-10-CM

## 2013-05-31 DIAGNOSIS — M949 Disorder of cartilage, unspecified: Secondary | ICD-10-CM

## 2013-05-31 NOTE — Patient Instructions (Signed)
Health Recommendations for Postmenopausal Women Respected and ongoing research has looked at the most common causes of death, disability, and poor quality of life in postmenopausal women. The causes include heart disease, diseases of blood vessels, diabetes, depression, cancer, and bone loss (osteoporosis). Many things can be done to help lower the chances of developing these and other common problems: CARDIOVASCULAR DISEASE Heart Disease: A heart attack is a medical emergency. Know the signs and symptoms of a heart attack. Below are things women can do to reduce their risk for heart disease.   Do not smoke. If you smoke, quit.  Aim for a healthy weight. Being overweight causes many preventable deaths. Eat a healthy and balanced diet and drink an adequate amount of liquids.  Get moving. Make a commitment to be more physically active. Aim for 30 minutes of activity on most, if not all days of the week.  Eat for heart health. Choose a diet that is low in saturated fat and cholesterol and eliminate trans fat. Include whole grains, vegetables, and fruits. Read and understand the labels on food containers before buying.  Know your numbers. Ask your caregiver to check your blood pressure, cholesterol (total, HDL, LDL, triglycerides) and blood glucose. Work with your caregiver on improving your entire clinical picture.  High blood pressure. Limit or stop your table salt intake (try salt substitute and food seasonings). Avoid salty foods and drinks. Read labels on food containers before buying. Eating well and exercising can help control high blood pressure. STROKE  Stroke is a medical emergency. Stroke may be the result of a blood clot in a blood vessel in the brain or by a brain hemorrhage (bleeding). Know the signs and symptoms of a stroke. To lower the risk of developing a stroke:  Avoid fatty foods.  Quit smoking.  Control your diabetes, blood pressure, and irregular heart rate. THROMBOPHLEBITIS  (BLOOD CLOT) OF THE LEG  Becoming overweight and leading a stationary lifestyle may also contribute to developing blood clots. Controlling your diet and exercising will help lower the risk of developing blood clots. CANCER SCREENING  Breast Cancer: Take steps to reduce your risk of breast cancer.  You should practice "breast self-awareness." This means understanding the normal appearance and feel of your breasts and should include breast self-examination. Any changes detected, no matter how small, should be reported to your caregiver.  After age 40, you should have a clinical breast exam (CBE) every year.  Starting at age 40, you should consider having a mammogram (breast X-ray) every year.  If you have a family history of breast cancer, talk to your caregiver about genetic screening.  If you are at high risk for breast cancer, talk to your caregiver about having an MRI and a mammogram every year.  Intestinal or Stomach Cancer: Tests to consider are a rectal exam, fecal occult blood, sigmoidoscopy, and colonoscopy. Women who are high risk may need to be screened at an earlier age and more often.  Cervical Cancer:  Beginning at age 30, you should have a Pap test every 3 years as long as the past 3 Pap tests have been normal.  If you have had past treatment for cervical cancer or a condition that could lead to cancer, you need Pap tests and screening for cancer for at least 20 years after your treatment.  If you had a hysterectomy for a problem that was not cancer or a condition that could lead to cancer, then you no longer need Pap tests.    If you are between ages 65 and 70, and you have had normal Pap tests going back 10 years, you no longer need Pap tests.  If Pap tests have been discontinued, risk factors (such as a new sexual partner) need to be reassessed to determine if screening should be resumed.  Some medical problems can increase the chance of getting cervical cancer. In these  cases, your caregiver may recommend more frequent screening and Pap tests.  Uterine Cancer: If you have vaginal bleeding after reaching menopause, you should notify your caregiver.  Ovarian cancer: Other than yearly pelvic exams, there are no reliable tests available to screen for ovarian cancer at this time except for yearly pelvic exams.  Lung Cancer: Yearly chest X-rays can detect lung cancer and should be done on high risk women, such as cigarette smokers and women with chronic lung disease (emphysema).  Skin Cancer: A complete body skin exam should be done at your yearly examination. Avoid overexposure to the sun and ultraviolet light lamps. Use a strong sun block cream when in the sun. All of these things are important in lowering the risk of skin cancer. MENOPAUSE Menopause Symptoms: Hormone therapy products are effective for treating symptoms associated with menopause:  Moderate to severe hot flashes.  Night sweats.  Mood swings.  Headaches.  Tiredness.  Loss of sex drive.  Insomnia.  Other symptoms. Hormone replacement carries certain risks, especially in older women. Women who use or are thinking about using estrogen or estrogen with progestin treatments should discuss that with their caregiver. Your caregiver will help you understand the benefits and risks. The ideal dose of hormone replacement therapy is not known. The Food and Drug Administration (FDA) has concluded that hormone therapy should be used only at the lowest doses and for the shortest amount of time to reach treatment goals.  OSTEOPOROSIS Protecting Against Bone Loss and Preventing Fracture: If you use hormone therapy for prevention of bone loss (osteoporosis), the risks for bone loss must outweigh the risk of the therapy. Ask your caregiver about other medications known to be safe and effective for preventing bone loss and fractures. To guard against bone loss or fractures, the following is recommended:  If  you are less than age 50, take 1000 mg of calcium and at least 600 mg of Vitamin D per day.  If you are greater than age 50 but less than age 70, take 1200 mg of calcium and at least 600 mg of Vitamin D per day.  If you are greater than age 70, take 1200 mg of calcium and at least 800 mg of Vitamin D per day. Smoking and excessive alcohol intake increases the risk of osteoporosis. Eat foods rich in calcium and vitamin D and do weight bearing exercises several times a week as your caregiver suggests. DIABETES Diabetes Melitus: If you have Type I or Type 2 diabetes, you should keep your blood sugar under control with diet, exercise and recommended medication. Avoid too many sweets, starchy and fatty foods. Being overweight can make control more difficult. COGNITION AND MEMORY Cognition and Memory: Menopausal hormone therapy is not recommended for the prevention of cognitive disorders such as Alzheimer's disease or memory loss.  DEPRESSION  Depression may occur at any age, but is common in elderly women. The reasons may be because of physical, medical, social (loneliness), or financial problems and needs. If you are experiencing depression because of medical problems and control of symptoms, talk to your caregiver about this. Physical activity and   exercise may help with mood and sleep. Community and volunteer involvement may help your sense of value and worth. If you have depression and you feel that the problem is getting worse or becoming severe, talk to your caregiver about treatment options that are best for you. ACCIDENTS  Accidents are common and can be serious in the elderly woman. Prepare your house to prevent accidents. Eliminate throw rugs, place hand bars in the bath, shower and toilet areas. Avoid wearing high heeled shoes or walking on wet, snowy, and icy areas. Limit or stop driving if you have vision or hearing problems, or you feel you are unsteady with you movements and  reflexes. HEPATITIS C Hepatitis C is a type of viral infection affecting the liver. It is spread mainly through contact with blood from an infected person. It can be treated, but if left untreated, it can lead to severe liver damage over years. Many people who are infected do not know that the virus is in their blood. If you are a "baby-boomer", it is recommended that you have one screening test for Hepatitis C. IMMUNIZATIONS  Several immunizations are important to consider having during your senior years, including:   Tetanus, diptheria, and pertussis booster shot.  Influenza every year before the flu season begins.  Pneumonia vaccine.  Shingles vaccine.  Others as indicated based on your specific needs. Talk to your caregiver about these. Document Released: 06/20/2005 Document Revised: 04/14/2012 Document Reviewed: 02/14/2008 Exit[employer] Patient Information 2014 [employer].  

## 2013-05-31 NOTE — Progress Notes (Signed)
Mary Roth 08-28-1974 [id number]    History:    Presents for breast and pelvic exam. Postmenopausal/no bleeding/no HRT. Uterine suspension 1968. 03/2012 T score -1.4, FRAX 20%/11%, Dr. Phineas Real discussed treatment,  tried Fosamax weekly, upset stomach and declines other treatment. 3 sisters with breast cancer after menopause. Hypertension/asthma/reflux primary care manages labs and meds/inhaled corticosteroids. Benign colon polyps 2008, negative colonoscopy 2013. Normal Pap and mammogram history.  Past medical history, past surgical history, family history and social history were all reviewed and documented in the EPIC chart.  Exam:  Filed Vitals:   05/31/13 1507  BP: 110/90    General appearance:  Normal Thyroid:  Symmetrical, normal in size, without palpable masses or nodularity. Respiratory  Auscultation:  Clear without wheezing or rhonchi Cardiovascular  Auscultation:  Regular rate, without rubs, murmurs or gallops  Edema/varicosities:  Not grossly evident Abdominal  Soft,nontender, without masses, guarding or rebound.  Liver/spleen:  No organomegaly noted  Hernia:  None appreciated  Skin  Inspection:  Grossly normal   Breasts: Examined lying and sitting.     Right: Without masses, retractions, discharge or axillary adenopathy.     Left: Without masses, retractions, discharge or axillary adenopathy. Gentitourinary   Inguinal/mons:  Normal without inguinal adenopathy  External genitalia:  Normal  BUS/Urethra/Skene's glands:  Normal  Vagina:  Normal  Cervix:  Normal  Uterus:   normal in size, shape and contour.  Midline and mobile  Adnexa/parametria:     Rt: Without masses or tenderness.   Lt: Without masses or tenderness.  Anus and perineum: Normal  Digital rectal exam: Normal sphincter tone without palpated masses or tenderness  Assessment/Plan:  80 y.o. MWF G2P2 for breast and pelvic exam.  Postmenopausal/no HRT/no bleeding Hypertension/reflux/asthma-primary  care manages labs and meds Osteopenia with elevated FRAX-declines treatment.  Plan: SBE's, continue annual mammogram, calcium rich diet, vitamin D 2000. Reviewed importance of home safety and fall prevention. Encouraged regular weightbearing exercise. Current on vaccinations will continue with annual flu vaccine.  Huel Cote [facility], 3:47 PM 05/31/2013

## 2013-06-03 ENCOUNTER — Encounter: Payer: Self-pay | Admitting: *Deleted

## 2013-06-07 ENCOUNTER — Encounter: Payer: Self-pay | Admitting: Cardiovascular Disease

## 2013-06-08 ENCOUNTER — Ambulatory Visit (INDEPENDENT_AMBULATORY_CARE_PROVIDER_SITE_OTHER): Payer: Medicare Other | Admitting: Cardiovascular Disease

## 2013-06-08 ENCOUNTER — Encounter (HOSPITAL_COMMUNITY): Payer: Self-pay | Admitting: *Deleted

## 2013-06-08 VITALS — BP 132/88 | HR 78 | Ht <= 58 in | Wt 193.0 lb

## 2013-06-08 DIAGNOSIS — E039 Hypothyroidism, unspecified: Secondary | ICD-10-CM

## 2013-06-08 DIAGNOSIS — I10 Essential (primary) hypertension: Secondary | ICD-10-CM

## 2013-06-08 DIAGNOSIS — R0602 Shortness of breath: Secondary | ICD-10-CM

## 2013-06-08 DIAGNOSIS — R0789 Other chest pain: Secondary | ICD-10-CM

## 2013-06-08 DIAGNOSIS — R0609 Other forms of dyspnea: Secondary | ICD-10-CM

## 2013-06-08 DIAGNOSIS — R0989 Other specified symptoms and signs involving the circulatory and respiratory systems: Secondary | ICD-10-CM

## 2013-06-08 DIAGNOSIS — J45909 Unspecified asthma, uncomplicated: Secondary | ICD-10-CM

## 2013-06-08 DIAGNOSIS — E785 Hyperlipidemia, unspecified: Secondary | ICD-10-CM

## 2013-06-08 DIAGNOSIS — R079 Chest pain, unspecified: Secondary | ICD-10-CM

## 2013-06-08 MED ORDER — NEBIVOLOL HCL 2.5 MG PO TABS
2.5000 mg | ORAL_TABLET | Freq: Every day | ORAL | Status: DC
Start: 2013-06-08 — End: 2013-10-19

## 2013-06-08 MED ORDER — NEBIVOLOL HCL 5 MG PO TABS: 2.5000 mg | ORAL_TABLET | Freq: Every day | ORAL | Status: DC

## 2013-06-08 MED ORDER — AMLODIPINE BESYLATE 2.5 MG PO TABS: 2.5000 mg | ORAL_TABLET | Freq: Every day | ORAL | Status: DC

## 2013-06-08 NOTE — Patient Instructions (Addendum)
Your physician has requested that you have an echocardiogram. Echocardiography is a painless test that uses sound waves to create images of your heart. It provides your doctor with information about the size and shape of your heart and how well your heart's chambers and valves are working. This procedure takes approximately one hour. There are no restrictions for this procedure.  Your physician has requested that you have a lexiscan myoview. For further information please visit [email or web address]. Please follow instruction sheet, as given.  Your physician has recommended you make the following change in your medication:  START amlodipine 2.5mg  once daily.  START bystolic 2.5mg  once daily START aspirin 81mg   Your physician recommends that you schedule a follow-up appointment in: 3-4 weeks, after your tests.

## 2013-06-08 NOTE — Progress Notes (Signed)
Patient ID: Mary Roth, female   DOB: 05-11-1929, 80 y.o.   MRN: [id number]     PATIENT PROFILE: [name] is an 80 year old female who is a former patient of Dr. Rollene Fare who has since retired from his solo practice. She sees Dr. Linna Darner for primary care. She is referred by Dr. Linna Darner for evaluation of chest pain and shortness of breath.   HPI: [name] has a long history of asthma for which she's been under the care of Dr. Ignacia Palma. He has used Advair as well as albuterol for this and takes Flonase for rhinitis. Remotely, she has seen Dr. Melvern Banker. An echo Doppler study done several years ago showed concentric left ventricular hypertrophy with normal systolic function and grade 1 diastolic dysfunction. She did have mild mitral annular calcification and evidence for aortic valve sclerosis Over the past several months, she has noticed increasing episodes of shortness of breath and also intermittent episodes of chest discomfort. She has noticed recent episodes of shortness of breath with walking. Approximately one month ago [place] experienced chest pressure associated with significant shortness of breath. She has noticed left arm radiation and also took a nitroglycerin which did seem to improve symptoms but this took approximately 20 minutes. She recently saw Dr. Linna Darner and now presents for cardiology evaluation.  Additional problems include hyperlipidemia, hypothyroidism, and intermittent leg swelling. She was told of having rheumatic fever while in 6 grade.  Past Medical History  Diagnosis Date  . Personal history of colonic polyps 08/21/2006    ADENOMATOUS POLYP  . Hepatic steatosis   . Abnormal liver function test   . Mastodynia   . Unspecified essential hypertension   . Other and unspecified hyperlipidemia   . Esophageal reflux   . Unspecified asthma(493.90) 2003     no childhood asthma  . Edema   . Unspecified hypothyroidism   .  Arthritis   . Osteopenia 03/2012    T score -1.4 FRAX 20%/11%; Dr Phineas Real    Past Surgical History  Procedure Laterality Date  . Appendectomy    . Cholecystectomy  1973     for stones  . Tonsillectomy    . Uterine suspension  1968    [place] , [place]    Allergies  Allergen Reactions  . Ciprofloxacin     ? Difficulty breathing  . Phenergan [Promethazine Hcl] Nausea And Vomiting    Current Outpatient Prescriptions  Medication Sig Dispense Refill  . ADVAIR DISKUS 250-50 MCG/DOSE AEPB USE 1 INHALATION BY MOUTHEVERY 12 HOURS GARGLE ANDSPIT AFTER EACH USE  60 each  1  . albuterol (PROVENTIL) (2.5 MG/3ML) 0.083% nebulizer solution Take 3 mLs (2.5 mg total) by nebulization every 4 (four) hours as needed for wheezing.  90 mL  3  . aspirin 81 MG tablet Take 81 mg by mouth daily.      . beclomethasone (QVAR) 80 MCG/ACT inhaler Inhale 2 puffs into the lungs 2 (two) times daily.      . Calcium Carbonate-Vit D-Min (CALTRATE 600+D PLUS PO) Take by mouth daily.      . clonazePAM (KLONOPIN) 0.5 MG tablet Take 1 tablet (0.5 mg total) by mouth at bedtime as needed for anxiety.  30 tablet  0  . CRESTOR 5 MG tablet TAKE 1 TABLET BY MOUTH ONCE A DAY  30 tablet  11  . fexofenadine (ALLEGRA) 180 MG tablet Take 180 mg by mouth daily as needed.        [name]  fluticasone (FLONASE) 50 MCG/ACT nasal spray Place 1 spray into the nose 2 (two) times daily as needed for rhinitis.  16 g  2  . levothyroxine (SYNTHROID, LEVOTHROID) 88 MCG tablet TAKE 1 TABLET BY MOUTH ONCE A DAY  90 tablet  1  . montelukast (SINGULAIR) 10 MG tablet TAKE 1 TABLET BY MOUTH ONCE A DAY AS NEEDED  90 tablet  1  . NEXIUM 40 MG capsule TAKE 1 CAPSULE BY MOUTH ONCE A DAY  30 capsule  5  . spironolactone (ALDACTONE) 25 MG tablet TAKE 1 TABLET BY MOUTH ONCE A DAY  90 tablet  1  . albuterol (PROVENTIL HFA;VENTOLIN HFA) 108 (90 BASE) MCG/ACT inhaler Inhale 2 puffs into the lungs every 6 (six) hours as needed for wheezing or shortness of breath.  1  Inhaler  2  . amLODipine (NORVASC) 2.5 MG tablet Take 1 tablet (2.5 mg total) by mouth daily.  30 tablet  6  . nebivolol (BYSTOLIC) 2.5 MG tablet Take 1 tablet (2.5 mg total) by mouth daily.  30 tablet  6  . nebivolol (BYSTOLIC) 5 MG tablet Take 0.5 tablets (2.5 mg total) by mouth daily.  21 tablet  0   No current facility-administered medications for this visit.    Socially she is married has 2 children one grandchild. There is no tobacco history. She does drink alcohol occasionally.  Family History  Problem Relation Age of Onset  . Uterine cancer Mother   . Ovarian cancer Mother   . Heart attack Father 51  . Prostate cancer Father   . Breast cancer Sister     x 3 sisters  . Asthma Sister   . Alcohol abuse Brother     x 3  . Prostate cancer Brother     x 3 brothers  . Pancreatic cancer Brother     x 2  . Diabetes Neg Hx   . Stroke Neg Hx     ROS is negative for fever chills or night sweats. She denies change in vision or hearing. She denies cough. There is no headache. She does have a history of asthma with intermittent wheezing. She denies PND orthopnea. She has developed shortness of breath with walking and has had several episodes of chest pressure with one incidence associated with left arm radiation. She denies presyncope or syncope. She denies abdominal pain nausea or vomiting. She denies awareness of blood in stool or urine per day she denies claudication symptoms. There are no myalgias. She does have hypothyroidism. She is not diabetic. She is unaware of sleep disordered breathing. Other comprehensive 14 point system review is negative.  PE BP 132/88  Pulse 78  Ht 4\' 10"  (1.473 m)  Wt 193 lb (87.544 kg)  BMI 40.35 kg/m2 General: Alert, oriented, no distress. Short stature with morbid obesity. Skin: normal turgor, no rashes HEENT: Normocephalic, atraumatic. Pupils round and reactive; sclera anicteric; extraocular muscles intact; Fundi  Nose without nasal septal  hypertrophy Mouth/Parynx benign; Mallinpatti scale 2 Neck: No JVD, no carotid bruits; normal carotid upstroke Lungs: clear to ausculatation and percussion; no wheezing or rales Chest wall: without tenderness to palpitation Heart: RRR, s1 s2 normal 1/6 systolic murmur. No S3 or S4 gallop. No rub. Abdomen: soft, nontender; no hepatosplenomehaly, BS+; abdominal aorta nontender and not dilated by palpation. Back: no CVA tenderness Pulses 2+ Extremities: Left ankle swelling; no clubbinbg cyanosis, Homan's sign negative  Neurologic: grossly nonfocal; Cranial nerves grossly wnl Psychologic: Normal mood and affect    ECG (  independently read by me): Sinus rhythm at 78 beats per minute. No significant ST changes. Normal intervals.  LABS:  BMET    Component Value Date/Time   NA 142 05/09/2013 0811   K 4.1 05/09/2013 0811   CL 108 05/09/2013 0811   CO2 23 05/09/2013 0811   GLUCOSE 86 05/09/2013 0811   BUN 17 05/09/2013 0811   CREATININE 0.8 05/09/2013 0811   CALCIUM 8.9 05/09/2013 0811   GFRNONAA 73.70 01/04/2009 0835   GFRAA 90 08/18/2006 0948     Hepatic Function Panel     Component Value Date/Time   PROT 6.6 05/09/2013 0811   ALBUMIN 3.7 05/09/2013 0811   AST 19 05/09/2013 0811   ALT 17 05/09/2013 0811   ALKPHOS 67 05/09/2013 0811   BILITOT 0.5 05/09/2013 0811   BILIDIR 0.1 05/09/2013 0811     CBC    Component Value Date/Time   WBC 7.2 05/09/2013 0811   RBC 4.51 05/09/2013 0811   HGB 14.0 05/09/2013 0811   HCT 42.1 05/09/2013 0811   PLT 198.0 05/09/2013 0811   MCV 93.4 05/09/2013 0811   MCHC 33.3 05/09/2013 0811   RDW 14.3 05/09/2013 0811   LYMPHSABS 1.7 05/09/2013 0811   MONOABS 0.5 05/09/2013 0811   EOSABS 0.3 05/09/2013 0811   BASOSABS 0.1 05/09/2013 0811     BNP No results found for this basename: probnp    Lipid Panel     Component Value Date/Time   CHOL 199 05/09/2013 0811   TRIG 107.0 05/09/2013 0811   HDL 58.90 05/09/2013 0811   CHOLHDL 3  05/09/2013 0811   VLDL 21.4 05/09/2013 0811   LDLCALC 119* 05/09/2013 0811     RADIOLOGY: No results found.   ASSESSMENT AND PLAN: My impression is that Ms. Deike is an 80 year old female who has a long-standing history of asthma but recently has developed increasing shortness of breath and seems to be experiencing this more with walking. She has noticed several episodes of significant chest pressure and on one instance associated with left arm radiation. Presently, her blood pressure is normal at 132/88 and her pulse is regular. I elected to add low-dose amlodipine at 2.5 mg in light of this chest pressure to induced vasodilation. Also adding a very low dose cardioselective beta-blockade with diastolic 2.5 mg. I suggested that she initiate a baby aspirin 81 mg. I am scheduling her for a 2-D echo Doppler study. Also perform a LexiScan Myoview study to assess for potential ischemic etiology. She recently had laboratory done by Dr. Linna Darner and these were reviewed. Her LDL cholesterol is 119 on Crestor. I willsee her back in the office in 3-4 weeks for followup evaluation and additional recommendations and adjustments to medications will be made at that time   Troy Sine, MD, [facility] 06/09/2013 9:54 PM

## 2013-06-09 ENCOUNTER — Telehealth (HOSPITAL_COMMUNITY): Payer: Self-pay | Admitting: *Deleted

## 2013-06-09 ENCOUNTER — Encounter: Payer: Self-pay | Admitting: Cardiovascular Disease

## 2013-06-09 ENCOUNTER — Other Ambulatory Visit: Payer: Self-pay | Admitting: *Deleted

## 2013-06-09 DIAGNOSIS — J45909 Unspecified asthma, uncomplicated: Secondary | ICD-10-CM

## 2013-06-09 DIAGNOSIS — R0609 Other forms of dyspnea: Secondary | ICD-10-CM | POA: Insufficient documentation

## 2013-06-09 DIAGNOSIS — R079 Chest pain, unspecified: Secondary | ICD-10-CM | POA: Insufficient documentation

## 2013-06-09 MED ORDER — ALBUTEROL SULFATE HFA 108 (90 BASE) MCG/ACT IN AERS: 2.0000 | INHALATION_SPRAY | Freq: Four times a day (QID) | RESPIRATORY_TRACT | Status: DC | PRN

## 2013-06-09 NOTE — Telephone Encounter (Signed)
Rx for ventolin sent to [facility] per pt request for stress test tomorrow.

## 2013-06-10 ENCOUNTER — Encounter (HOSPITAL_COMMUNITY): Payer: Medicare Other

## 2013-06-10 ENCOUNTER — Telehealth (HOSPITAL_COMMUNITY): Payer: Self-pay | Admitting: *Deleted

## 2013-06-20 ENCOUNTER — Ambulatory Visit (HOSPITAL_COMMUNITY)
Admission: RE | Admit: 2013-06-20 | Discharge: 2013-06-20 | Disposition: A | Discharge status: Home / Self Care | Payer: Medicare Other | Source: Ambulatory Visit | Attending: Cardiovascular Disease | Admitting: Cardiovascular Disease

## 2013-06-20 DIAGNOSIS — R0789 Other chest pain: Secondary | ICD-10-CM

## 2013-06-20 DIAGNOSIS — I10 Essential (primary) hypertension: Secondary | ICD-10-CM | POA: Insufficient documentation

## 2013-06-20 DIAGNOSIS — R0602 Shortness of breath: Secondary | ICD-10-CM

## 2013-06-20 DIAGNOSIS — R0989 Other specified symptoms and signs involving the circulatory and respiratory systems: Principal | ICD-10-CM | POA: Insufficient documentation

## 2013-06-20 DIAGNOSIS — E785 Hyperlipidemia, unspecified: Secondary | ICD-10-CM | POA: Insufficient documentation

## 2013-06-20 DIAGNOSIS — I519 Heart disease, unspecified: Secondary | ICD-10-CM

## 2013-06-20 DIAGNOSIS — R0609 Other forms of dyspnea: Secondary | ICD-10-CM | POA: Insufficient documentation

## 2013-06-20 DIAGNOSIS — R079 Chest pain, unspecified: Secondary | ICD-10-CM | POA: Insufficient documentation

## 2013-06-20 NOTE — Progress Notes (Signed)
2D Echo Performed 06/20/2013    Marygrace Drought, RCS

## 2013-06-21 ENCOUNTER — Encounter (HOSPITAL_COMMUNITY): Payer: Medicare Other

## 2013-06-23 ENCOUNTER — Ambulatory Visit (HOSPITAL_COMMUNITY)
Admission: RE | Admit: 2013-06-23 | Discharge: 2013-06-23 | Disposition: A | Discharge status: Home / Self Care | Payer: Medicare Other | Source: Ambulatory Visit | Attending: Internal Medicine | Admitting: Internal Medicine

## 2013-06-23 DIAGNOSIS — I10 Essential (primary) hypertension: Secondary | ICD-10-CM | POA: Insufficient documentation

## 2013-06-23 DIAGNOSIS — R0609 Other forms of dyspnea: Secondary | ICD-10-CM | POA: Insufficient documentation

## 2013-06-23 DIAGNOSIS — Z8249 Family history of ischemic heart disease and other diseases of the circulatory system: Secondary | ICD-10-CM | POA: Insufficient documentation

## 2013-06-23 DIAGNOSIS — E785 Hyperlipidemia, unspecified: Secondary | ICD-10-CM | POA: Insufficient documentation

## 2013-06-23 DIAGNOSIS — R0602 Shortness of breath: Secondary | ICD-10-CM | POA: Insufficient documentation

## 2013-06-23 DIAGNOSIS — R079 Chest pain, unspecified: Secondary | ICD-10-CM | POA: Insufficient documentation

## 2013-06-23 DIAGNOSIS — E669 Obesity, unspecified: Secondary | ICD-10-CM | POA: Insufficient documentation

## 2013-06-23 DIAGNOSIS — R0789 Other chest pain: Secondary | ICD-10-CM

## 2013-06-23 DIAGNOSIS — J45909 Unspecified asthma, uncomplicated: Secondary | ICD-10-CM | POA: Insufficient documentation

## 2013-06-23 DIAGNOSIS — R002 Palpitations: Secondary | ICD-10-CM | POA: Insufficient documentation

## 2013-06-23 DIAGNOSIS — R0989 Other specified symptoms and signs involving the circulatory and respiratory systems: Secondary | ICD-10-CM | POA: Insufficient documentation

## 2013-06-23 MED ORDER — REGADENOSON 0.4 MG/5ML IV SOLN
0.4000 mg | Freq: Once | INTRAVENOUS | Status: AC
  Administered 2013-06-23: 0.4 mg via INTRAVENOUS

## 2013-06-23 MED ORDER — TECHNETIUM TC 99M SESTAMIBI GENERIC - CARDIOLITE
30.1000 | Freq: Once | INTRAVENOUS | Status: AC | PRN
  Administered 2013-06-23: 30.1 via INTRAVENOUS

## 2013-06-23 MED ORDER — TECHNETIUM TC 99M SESTAMIBI GENERIC - CARDIOLITE
10.6000 | Freq: Once | INTRAVENOUS | Status: AC | PRN
  Administered 2013-06-23: 11 via INTRAVENOUS

## 2013-06-23 NOTE — Procedures (Addendum)
[facility] [address] [place] [address] [phone]  Cardiology Nuclear Med Study  Mary Roth is a 80 y.o. female     MRN : [id number]     DOB: 24-Sep-1928  Procedure Date: 06/23/2013  Nuclear Med Background Indication for Stress Test:  Evaluation for Ischemia History:  Asthma and LVH;AVS;RHF Cardiac Risk Factors: Family History - CAD, Hypertension, Lipids and Obesity  Symptoms:  Chest Pain, DOE, Palpitations and SOB   Nuclear Pre-Procedure Caffeine/Decaff Intake:  7:00pm NPO After: 5:00am   IV Site: R Forearm  IV 0.9% NS with Angio Cath:  22g  Chest Size (in):  n/a IV Started by: Azucena Cecil, RN  Height: 4\' 10"  (1.473 m)  Cup Size: C  BMI:  Body mass index is 40.35 kg/(m^2). Weight:  193 lb (87.544 kg)   Tech Comments:  n/a    Nuclear Med Study 1 or 2 day study: 1 day  Stress Test Type:  [place]  Order Authorizing Provider:  Shelva Majestic, MD   Resting Radionuclide: Technetium 10m Sestamibi  Resting Radionuclide Dose: 10.6 mCi   Stress Radionuclide:  Technetium 39m Sestamibi  Stress Radionuclide Dose: 30.1 mCi           Stress Protocol Rest HR: 64 Stress HR: 95  Rest BP: 143/79 Stress BP: 153/68  Exercise Time (min): n/a METS: n/a   Predicted Max HR: 138 bpm % Max HR: 68.84 bpm Rate Pressure Product: 14630  Dose of Adenosine (mg):  n/a Dose of Lexiscan: 0.4 mg  Dose of Atropine (mg): n/a Dose of Dobutamine: n/a mcg/kg/min (at max HR)  Stress Test Technologist: Leane Para, CCT Nuclear Technologist: Imagene Riches, CNMT   Rest Procedure:  Myocardial perfusion imaging was performed at rest 45 minutes following the intravenous administration of Technetium 63m Sestamibi. Stress Procedure:  The patient received IV Lexiscan 0.4 mg over 15-seconds.  Technetium 41m Sestamibi injected at 30-seconds.  There were no significant changes with Lexiscan.  Quantitative spect images were obtained after a 45  minute delay.  Transient Ischemic Dilatation (Normal <1.22):  1.01 Lung/Heart Ratio (Normal <0.45):  0.29 QGS EDV:  63 ml QGS ESV:  19 ml LV Ejection Fraction: 70%     Rest ECG: NSR - Normal EKG  Stress ECG: No significant change from baseline ECG  QPS Raw Data Images:  Normal; no motion artifact; normal heart/lung ratio. Stress Images:  Normal homogeneous uptake in all areas of the myocardium. Rest Images:  Normal homogeneous uptake in all areas of the myocardium. Subtraction (SDS):  No evidence of ischemia. LV Wall Motion:  NL LV Function; NL Wall Motion  Impression Exercise Capacity:  Lexiscan with no exercise. BP Response:  Normal blood pressure response. Clinical Symptoms:  No significant symptoms noted. ECG Impression:  No significant ECG changes with Lexiscan. Comparison with Prior Nuclear Study: No previous nuclear study performed   Overall Impression:  Normal stress nuclear study.   Sanda Klein, MD  06/23/2013 12:38 PM

## 2013-06-27 ENCOUNTER — Other Ambulatory Visit: Payer: Self-pay | Admitting: Internal Medicine

## 2013-06-29 NOTE — Telephone Encounter (Signed)
Rx sent to the pharmacy by e-script.//AB/CMA 

## 2013-06-30 ENCOUNTER — Ambulatory Visit (INDEPENDENT_AMBULATORY_CARE_PROVIDER_SITE_OTHER): Payer: Medicare Other | Admitting: Cardiovascular Disease

## 2013-06-30 ENCOUNTER — Encounter: Payer: Self-pay | Admitting: Cardiovascular Disease

## 2013-06-30 VITALS — BP 124/72 | HR 64 | Ht <= 58 in | Wt 190.0 lb

## 2013-06-30 DIAGNOSIS — I10 Essential (primary) hypertension: Secondary | ICD-10-CM

## 2013-06-30 DIAGNOSIS — J45909 Unspecified asthma, uncomplicated: Secondary | ICD-10-CM

## 2013-06-30 DIAGNOSIS — E785 Hyperlipidemia, unspecified: Secondary | ICD-10-CM

## 2013-06-30 DIAGNOSIS — R0989 Other specified symptoms and signs involving the circulatory and respiratory systems: Secondary | ICD-10-CM

## 2013-06-30 DIAGNOSIS — R0609 Other forms of dyspnea: Secondary | ICD-10-CM

## 2013-06-30 DIAGNOSIS — R0602 Shortness of breath: Secondary | ICD-10-CM

## 2013-06-30 DIAGNOSIS — R079 Chest pain, unspecified: Secondary | ICD-10-CM

## 2013-06-30 NOTE — Patient Instructions (Signed)
Dr Claiborne Billings has ordered a cardiometabolic test - this is done in our office on Mondays.  What is a Cardiopulmonary Exercise Test (CPET)?   The Cardiopulmonary Exercise Test is a highly sensitive, non-invasive stress test. It is considered a stress test because the exercise stresses your body's systems by making them work faster and harder. A disease or condition that affects the heart, lungs or muscles will limit how much faster and harder these systems can work. A CPET assesses how well the heart, lungs, and muscles are working individually, and how these systems are working in unison. Your heart and lungs work together to deliver oxygen to your muscles, where it is used to make energy, and to remove carbon dioxide from your body.  The full cardiopulmonary system is assessed during a CPET by measuring the amount of oxygen your body is using, the amount of carbon dioxide it is producing, your breathing pattern, and electrocardiogram (EKG) while you are riding a stationary bicycle.  The traditional treadmill stress test only relies on the EKG, which only partially assesses the heart and nothing else. Besides detecting problems in multiple body systems, the CPET is also used to monitor changes in your disease condition, the effect of certain medications on your body, and if medical therapy is improving your condition.  What conditions can be detected/monitored by the Cardiopulmonary ExerciseTest?  Heart, lung, and metabolic conditions may cause shortness of breath, exercise intolerance or discomfort and pain in the chest. The CPET is the only test that can simultaneously determine which of these systems is causing the problem   Dr. Claiborne Billings would like you to schedule a follow up visit after you have this test so that he can review the results with you

## 2013-07-02 ENCOUNTER — Encounter: Payer: Self-pay | Admitting: Cardiovascular Disease

## 2013-07-02 NOTE — Progress Notes (Signed)
Patient ID: Mary Roth, female   DOB: 1928/05/16, 80 y.o.   MRN: [id number]     HPI:  [name] is an 80 year old female who I saw for initial cardiology evaluation on 06/08/2013 after she was referred by Dr. Linna Darner for evaluation of chest pain and shortness of breath. She presents now for followup evaluation the   Mary Roth has a long history of asthma for which she's been under the care of Dr. Ignacia Palma. He has used Advair as well as albuterol for this and takes Flonase for rhinitis. Remotely, she has seen Dr. Melvern Banker. An echo Doppler study several years ago showed concentric left ventricular hypertrophy with normal systolic function and grade 1 diastolic dysfunction. She had mild mitral annular calcification and evidence for aortic valve sclerosis Over the past several months, she had noticed increasing episodes of shortness of breath and also intermittent episodes of chest discomfort. She has noticed recent episodes of shortness of breath with walking. Approximately one month ago during [place] she experienced chest pressure associated with significant shortness of breath. She has noticed left arm radiation and also took a nitroglycerin which did seem to improve symptoms but this took approximately 20 minutes.   Additional problems include hyperlipidemia, hypothyroidism, and intermittent leg swelling. She was told of having rheumatic fever while in 6 grade.  Since her initial evaluation, she underwent an echo Doppler study which was done 06/20/2013 which showed normal systolic function with an ejection fraction of 55-60% but with grade 1 diastolic dysfunction. She did not have significant valvular pathology and her chamber dimensions were normal. However, she did have left ventricular hypertrophy. A nuclear perfusion study was done on 06/23/2013 and this was felt to be normal without evidence for scar or ischemia. Post stress ejection fraction was 70%.  Mary Roth  continues to experience significant shortness of breath with activity. At times she feels this has progressed to the point where she is unable to breathe. Her echo Doppler study showed normal right ventricular pressure at 11 mm and her peak RV to RA gradient was 8 mm. She denies any calf tenderness, there is no PND or orthopnea or palpitations. She presents for followup evaluation.  Past Medical History  Diagnosis Date  . Personal history of colonic polyps 08/21/2006    ADENOMATOUS POLYP  . Hepatic steatosis   . Abnormal liver function test   . Mastodynia   . Unspecified essential hypertension   . Other and unspecified hyperlipidemia   . Esophageal reflux   . Unspecified asthma(493.90) 2003     no childhood asthma  . Edema   . Unspecified hypothyroidism   . Arthritis   . Osteopenia 03/2012    T score -1.4 FRAX 20%/11%; Dr Phineas Real    Past Surgical History  Procedure Laterality Date  . Appendectomy    . Cholecystectomy  1973     for stones  . Tonsillectomy    . Uterine suspension  1968    [place] , [place]    Allergies  Allergen Reactions  . Ciprofloxacin     ? Difficulty breathing  . Phenergan [Promethazine Hcl] Nausea And Vomiting    Current Outpatient Prescriptions  Medication Sig Dispense Refill  . ADVAIR DISKUS 250-50 MCG/DOSE AEPB USE 1 INHALATION BY MOUTHEVERY 12 HOURS GARGLE ANDSPIT AFTER EACH USE  60 each  1  . albuterol (PROVENTIL HFA;VENTOLIN HFA) 108 (90 BASE) MCG/ACT inhaler Inhale 2 puffs into the lungs every 6 (six) hours  as needed for wheezing or shortness of breath.  1 Inhaler  2  . albuterol (PROVENTIL) (2.5 MG/3ML) 0.083% nebulizer solution Take 3 mLs (2.5 mg total) by nebulization every 4 (four) hours as needed for wheezing.  90 mL  3  . amLODipine (NORVASC) 2.5 MG tablet Take 1 tablet (2.5 mg total) by mouth daily.  30 tablet  6  . aspirin 81 MG tablet Take 81 mg by mouth daily.      . beclomethasone (QVAR) 80 MCG/ACT inhaler Inhale 2 puffs into the lungs  2 (two) times daily.      . Calcium Carbonate-Vit D-Min (CALTRATE 600+D PLUS PO) Take by mouth daily.      . clonazePAM (KLONOPIN) 0.5 MG tablet Take 1 tablet (0.5 mg total) by mouth at bedtime as needed for anxiety.  30 tablet  0  . CRESTOR 5 MG tablet TAKE 1 TABLET BY MOUTH ONCE A DAY  30 tablet  6  . fexofenadine (ALLEGRA) 180 MG tablet Take 180 mg by mouth daily as needed.        . fluticasone (FLONASE) 50 MCG/ACT nasal spray Place 1 spray into the nose 2 (two) times daily as needed for rhinitis.  16 g  2  . levothyroxine (SYNTHROID, LEVOTHROID) 88 MCG tablet TAKE 1 TABLET BY MOUTH ONCE A DAY  90 tablet  1  . montelukast (SINGULAIR) 10 MG tablet TAKE 1 TABLET BY MOUTH ONCE A DAY AS NEEDED  90 tablet  1  . nebivolol (BYSTOLIC) 2.5 MG tablet Take 1 tablet (2.5 mg total) by mouth daily.  30 tablet  6  . nebivolol (BYSTOLIC) 5 MG tablet Take 0.5 tablets (2.5 mg total) by mouth daily.  21 tablet  0  . NEXIUM 40 MG capsule TAKE 1 CAPSULE BY MOUTH ONCE A DAY  30 capsule  5  . spironolactone (ALDACTONE) 25 MG tablet TAKE 1 TABLET BY MOUTH ONCE A DAY  90 tablet  1   No current facility-administered medications for this visit.    Socially she is married has 2 children one grandchild. There is no tobacco history. She does drink alcohol occasionally.  Family History  Problem Relation Age of Onset  . Uterine cancer Mother   . Ovarian cancer Mother   . Heart attack Father 43  . Prostate cancer Father   . Breast cancer Sister     x 3 sisters  . Asthma Sister   . Alcohol abuse Brother     x 3  . Prostate cancer Brother     x 3 brothers  . Pancreatic cancer Brother     x 2  . Diabetes Neg Hx   . Stroke Neg Hx     ROS is negative for fever chills or night sweats. She denies change in vision or hearing. She denies cough. There is no headache. She does have a history of asthma with intermittent wheezing. She denies PND orthopnea. She has developed shortness of breath with walking and has had  several episodes of chest pressure with one incidence associated with left arm radiation. She denies presyncope or syncope. She denies abdominal pain nausea or vomiting. She denies awareness of blood in stool or urine per day she denies claudication symptoms. There are no myalgias. She does have hypothyroidism. She is not diabetic. She is unaware of sleep disordered breathing. Other comprehensive 14 point system review is negative.  PE BP 124/72  Pulse 64  Ht _0  (1.473 m)  Wt 190 lb (86.183  kg)  BMI 39.72 kg/m2 General: Alert, oriented, no distress. Short stature with morbid obesity. Skin: normal turgor, no rashes HEENT: Normocephalic, atraumatic. Pupils round and reactive; sclera anicteric; extraocular muscles intact; Fundi  Nose without nasal septal hypertrophy Mouth/Parynx benign; Mallinpatti scale 2 Neck: No JVD, no carotid bruits; normal carotid upstroke Lungs: clear to ausculatation and percussion; no wheezing or rales Chest wall: without tenderness to palpitation Heart: RRR, s1 s2 normal 1/6 systolic murmur. No S3 or S4 gallop. No rub, thrill or heaves. Abdomen: soft, nontender; no hepatosplenomehaly, BS+; abdominal aorta nontender and not dilated by palpation. Back: no CVA tenderness Pulses 2+ Extremities: Left ankle swelling; no clubbinbg cyanosis, Homan's sign negative  Neurologic: grossly nonfocal; Cranial nerves grossly wnl Psychologic: Normal mood and affect    ECG (independently read by me): Normal sinus rhythm at 64 beats per minute. Normal intervals. No evidence for either an S wave in I or Q in 3.   Prior ECG of 06/08/2013 (independently read by me): Sinus rhythm at 78 beats per minute. No significant ST changes. Normal intervals.  LABS:  BMET    Component Value Date/Time   NA 142 05/09/2013 0811   K 4.1 05/09/2013 0811   CL 108 05/09/2013 0811   CO2 23 05/09/2013 0811   GLUCOSE 86 05/09/2013 0811   BUN 17 05/09/2013 0811   CREATININE 0.8 05/09/2013 0811    CALCIUM 8.9 05/09/2013 0811   GFRNONAA 73.70 01/04/2009 0835   GFRAA 90 08/18/2006 0948     Hepatic Function Panel     Component Value Date/Time   PROT 6.6 05/09/2013 0811   ALBUMIN 3.7 05/09/2013 0811   AST 19 05/09/2013 0811   ALT 17 05/09/2013 0811   ALKPHOS 67 05/09/2013 0811   BILITOT 0.5 05/09/2013 0811   BILIDIR 0.1 05/09/2013 0811     CBC    Component Value Date/Time   WBC 7.2 05/09/2013 0811   RBC 4.51 05/09/2013 0811   HGB 14.0 05/09/2013 0811   HCT 42.1 05/09/2013 0811   PLT 198.0 05/09/2013 0811   MCV 93.4 05/09/2013 0811   MCHC 33.3 05/09/2013 0811   RDW 14.3 05/09/2013 0811   LYMPHSABS 1.7 05/09/2013 0811   MONOABS 0.5 05/09/2013 0811   EOSABS 0.3 05/09/2013 0811   BASOSABS 0.1 05/09/2013 0811     BNP No results found for this basename: probnp    Lipid Panel     Component Value Date/Time   CHOL 199 05/09/2013 0811   TRIG 107.0 05/09/2013 0811   HDL 58.90 05/09/2013 0811   CHOLHDL 3 05/09/2013 0811   VLDL 21.4 05/09/2013 0811   LDLCALC 119* 05/09/2013 0811     RADIOLOGY: No results found.   ASSESSMENT AND PLAN: Mary Roth is an 80 year old female with a long-standing history of asthma who recently has developed increasing shortness of breath and seems to be experiencing this more with walking. She has noticed several episodes of significant chest pressure and on one instance associated with left arm radiation. When I initially saw her I elected to add low-dose amlodipine at 2.5 mg in light of this chest pressure to induced vasodilation and also added a very low dose cardioselective beta-blockade with bystolic 2.5 mg and suggested that she initiate a baby aspirin 81 mg. her blood pressure today is well controlled and was 124/72 when taken initially and on repeat by me was 110/70. I reviewed her echo Doppler with her in detail as well as her nuclear perfusion study. She continues to experience  shortness of breath which is her major problem. I  discussed with her an evaluation with a cardiopulmonary met test which will be helpful to ascertain maximum oxygen consumption as well as to evaluate for the possibility of microvascular angina. This will also be hopeful to assess pulmonary function as well as cardiac deconditiioning which may also be contributing to her dyspnea. This will be scheduled to be done next week and does see her in followup in for. further recommendations.  Troy Sine, MD, [facility] 07/02/2013 5:33 PM

## 2013-07-04 DIAGNOSIS — R0602 Shortness of breath: Secondary | ICD-10-CM

## 2013-07-20 ENCOUNTER — Ambulatory Visit (INDEPENDENT_AMBULATORY_CARE_PROVIDER_SITE_OTHER): Payer: Medicare Other | Admitting: Cardiovascular Disease

## 2013-07-20 ENCOUNTER — Encounter: Payer: Self-pay | Admitting: Cardiovascular Disease

## 2013-07-20 VITALS — BP 128/70 | HR 84 | Ht <= 58 in | Wt 195.1 lb

## 2013-07-20 DIAGNOSIS — E039 Hypothyroidism, unspecified: Secondary | ICD-10-CM

## 2013-07-20 DIAGNOSIS — R0989 Other specified symptoms and signs involving the circulatory and respiratory systems: Secondary | ICD-10-CM

## 2013-07-20 DIAGNOSIS — R0609 Other forms of dyspnea: Secondary | ICD-10-CM

## 2013-07-20 DIAGNOSIS — E785 Hyperlipidemia, unspecified: Secondary | ICD-10-CM

## 2013-07-20 DIAGNOSIS — I10 Essential (primary) hypertension: Secondary | ICD-10-CM

## 2013-07-20 MED ORDER — SPIRONOLACTONE 25 MG PO TABS: 25.0000 mg | ORAL_TABLET | Freq: Two times a day (BID) | ORAL | Status: DC

## 2013-07-20 NOTE — Progress Notes (Addendum)
Patient ID: Mary Roth, female   DOB: November 03, 1928, 80 y.o.   MRN: [id number]      HPI:  Mary Roth is an 80 year old female who presents for cardiology followup evaluation following her recent cardiopulmonary met exercise test.  [name] has a long history of asthma for which she's been under the care of Dr. Ignacia Palma and takes  Advair PRN as well as albuterol  and also takes Flonase for rhinitis. She is a former patient of Dr. Melvern Banker. An echo Doppler study several years ago showed concentric left ventricular hypertrophy with normal systolic function and grade 1 diastolic dysfunction. She had mild mitral annular calcification and evidence for aortic valve sclerosis Over the past several months, she had noticed increasing episodes of shortness of breath and also intermittent episodes of chest discomfort. She has noticed recent episodes of shortness of breath with walking. Approximately one month ago [place] she experienced chest pressure associated with significant shortness of breath. She has noticed left arm radiation and also took a nitroglycerin which did seem to improve symptoms but this took approximately 20 minutes.   Additional problems include hyperlipidemia, hypothyroidism, and intermittent leg swelling. She was told of having rheumatic fever while in 6 grade.  Since her initial evaluation, she underwent an echo Doppler study which was done 06/20/2013 which showed normal systolic function with an ejection fraction of 55-60% but with grade 1 diastolic dysfunction. She did not have significant valvular pathology and her chamber dimensions were normal. However, she did have left ventricular hypertrophy. A nuclear perfusion study was done on 06/23/2013 and this was felt to be normal without evidence for scar or ischemia. Post stress ejection fraction was 70%.  [name] continued to experience significant shortness of breath with activity and at times she  feels this has progressed to the point where she is unable to breathe. Her echo Doppler study showed normal right ventricular pressure at 11 mm and her peak RV to RA gradient was 8 mm. She denies any calf tenderness, there is no PND or orthopnea or palpitations.   When I last saw her, I empirically added low-dose amlodipine 2.5 mg as well as Bystolic 2.5 mg to her medical regimen. Due to her continued shortness of breath with activity I recommended a cardiopulmonary met that to evaluate for potential microvascular angina and endothelial dysfunction. This was done 04/03/2014. She had normal functional status and achieved a peak maximum oxygen consumption 92% of predicted. Her cardiovascular response to exercise was felt to be indeterminate due to suboptimal peak cardiovascular stress load. She had a blunted chronotropic response to exercise. Pulmonary response was normal. Exercise was limited by effort. PFT summary revealed spirometry, lung volumes, and diffusion were within normal limits. Her anaerobic threshold was low at 9.4.  Last week, Ms. Clegg discontinued her amlodipine and low-dose Bystolic since she developed a maculopapular rash and felt this may have been secondary to one of those new medications. She denies any recurrent episodes of chest pain.  Past Medical History  Diagnosis Date  . Personal history of colonic polyps 08/21/2006    ADENOMATOUS POLYP  . Hepatic steatosis   . Abnormal liver function test   . Mastodynia   . Unspecified essential hypertension   . Other and unspecified hyperlipidemia   . Esophageal reflux   . Unspecified asthma(493.90) 2003     no childhood asthma  . Edema   . Unspecified hypothyroidism   . Arthritis   .  Osteopenia 03/2012    T score -1.4 FRAX 20%/11%; Dr Phineas Real    Past Surgical History  Procedure Laterality Date  . Appendectomy    . Cholecystectomy  1973     for stones  . Tonsillectomy    . Uterine suspension  1968    [place] , [place]     Allergies  Allergen Reactions  . Ciprofloxacin     ? Difficulty breathing  . Phenergan [Promethazine Hcl] Nausea And Vomiting    Current Outpatient Prescriptions  Medication Sig Dispense Refill  . ADVAIR DISKUS 250-50 MCG/DOSE AEPB USE 1 INHALATION BY MOUTHEVERY 12 HOURS GARGLE ANDSPIT AFTER EACH USE  60 each  1  . albuterol (PROVENTIL HFA;VENTOLIN HFA) 108 (90 BASE) MCG/ACT inhaler Inhale 2 puffs into the lungs every 6 (six) hours as needed for wheezing or shortness of breath.  1 Inhaler  2  . albuterol (PROVENTIL) (2.5 MG/3ML) 0.083% nebulizer solution Take 3 mLs (2.5 mg total) by nebulization every 4 (four) hours as needed for wheezing.  90 mL  3  . amLODipine (NORVASC) 2.5 MG tablet Take 1 tablet (2.5 mg total) by mouth daily.  30 tablet  6  . aspirin 81 MG tablet Take 81 mg by mouth daily.      . beclomethasone (QVAR) 80 MCG/ACT inhaler Inhale 2 puffs into the lungs 2 (two) times daily.      . Calcium Carbonate-Vit D-Min (CALTRATE 600+D PLUS PO) Take by mouth daily.      . clonazePAM (KLONOPIN) 0.5 MG tablet Take 1 tablet (0.5 mg total) by mouth at bedtime as needed for anxiety.  30 tablet  0  . CRESTOR 5 MG tablet TAKE 1 TABLET BY MOUTH ONCE A DAY  30 tablet  6  . fexofenadine (ALLEGRA) 180 MG tablet Take 180 mg by mouth daily as needed.        . fluticasone (FLONASE) 50 MCG/ACT nasal spray Place 1 spray into the nose 2 (two) times daily as needed for rhinitis.  16 g  2  . levothyroxine (SYNTHROID, LEVOTHROID) 88 MCG tablet TAKE 1 TABLET BY MOUTH ONCE A DAY  90 tablet  1  . montelukast (SINGULAIR) 10 MG tablet TAKE 1 TABLET BY MOUTH ONCE A DAY AS NEEDED  90 tablet  1  . nebivolol (BYSTOLIC) 2.5 MG tablet Take 1 tablet (2.5 mg total) by mouth daily.  30 tablet  6  . NEXIUM 40 MG capsule TAKE 1 CAPSULE BY MOUTH ONCE A DAY  30 capsule  5  . spironolactone (ALDACTONE) 25 MG tablet Take 1 tablet (25 mg total) by mouth 2 (two) times daily.  60 tablet  6   No current  facility-administered medications for this visit.    Socially she is married has 2 children one grandchild. There is no tobacco history. She does drink alcohol occasionally.  Family History  Problem Relation Age of Onset  . Uterine cancer Mother   . Ovarian cancer Mother   . Heart attack Father 24  . Prostate cancer Father   . Breast cancer Sister     x 3 sisters  . Asthma Sister   . Alcohol abuse Brother     x 3  . Prostate cancer Brother     x 3 brothers  . Pancreatic cancer Brother     x 2  . Diabetes Neg Hx   . Stroke Neg Hx     ROS is negative for fever chills or night sweats. She denies change in vision  or hearing. She denies cough. There is no headache. She does have a history of asthma with intermittent wheezing. She denies PND orthopnea. She has developed shortness of breath with walking and has had several episodes of chest pressure with one incidence associated with left arm radiation. She denies presyncope or syncope. She denies abdominal pain nausea or vomiting. She denies awareness of blood in stool or urine per day she denies claudication symptoms. There are no myalgias. She does have hypothyroidism. She is not diabetic. She is unaware of sleep disordered breathing. Other comprehensive 14 point system review is negative.  PE BP 128/70  Pulse 84  Ht 4' 10"  (1.473 m)  Wt 195 lb 1.6 oz (88.497 kg)  BMI 40.79 kg/m2 General: Alert, oriented, no distress. Short stature with morbid obesity. Skin: Diffuse mild maculopapular rash on the torso, back, and arms  HEENT: Normocephalic, atraumatic. Pupils round and reactive; sclera anicteric; extraocular muscles intact; Fundi  Nose without nasal septal hypertrophy Mouth/Parynx benign; Mallinpatti scale 2 Neck: No JVD, no carotid bruits; normal carotid upstroke Lungs: clear to ausculatation and percussion; no wheezing or rales Chest wall: without tenderness to palpitation Heart: RRR, s1 s2 normal 1/6 systolic murmur. No S3 or  S4 gallop. No rub, thrill or heaves. Abdomen: soft, nontender; no hepatosplenomehaly, BS+; abdominal aorta nontender and not dilated by palpation. Back: no CVA tenderness Pulses 2+ Extremities: bilateral lower extremity edema particularly above the sock line; no clubbinbg cyanosis, Homan's sign negative  Neurologic: grossly nonfocal; Cranial nerves grossly wnl Psychologic: Normal mood and affect    Last ECG (independently read by me): Normal sinus rhythm at 64 beats per minute. Normal intervals. No evidence for either an S wave in I or Q in 3.   Prior ECG of 06/08/2013 (independently read by me): Sinus rhythm at 78 beats per minute. No significant ST changes. Normal intervals.  LABS:  BMET    Component Value Date/Time   NA 142 05/09/2013 0811   K 4.1 05/09/2013 0811   CL 108 05/09/2013 0811   CO2 23 05/09/2013 0811   GLUCOSE 86 05/09/2013 0811   BUN 17 05/09/2013 0811   CREATININE 0.8 05/09/2013 0811   CALCIUM 8.9 05/09/2013 0811   GFRNONAA 73.70 01/04/2009 0835   GFRAA 90 08/18/2006 0948     Hepatic Function Panel     Component Value Date/Time   PROT 6.6 05/09/2013 0811   ALBUMIN 3.7 05/09/2013 0811   AST 19 05/09/2013 0811   ALT 17 05/09/2013 0811   ALKPHOS 67 05/09/2013 0811   BILITOT 0.5 05/09/2013 0811   BILIDIR 0.1 05/09/2013 0811     CBC    Component Value Date/Time   WBC 7.2 05/09/2013 0811   RBC 4.51 05/09/2013 0811   HGB 14.0 05/09/2013 0811   HCT 42.1 05/09/2013 0811   PLT 198.0 05/09/2013 0811   MCV 93.4 05/09/2013 0811   MCHC 33.3 05/09/2013 0811   RDW 14.3 05/09/2013 0811   LYMPHSABS 1.7 05/09/2013 0811   MONOABS 0.5 05/09/2013 0811   EOSABS 0.3 05/09/2013 0811   BASOSABS 0.1 05/09/2013 0811     BNP No results found for this basename: probnp    Lipid Panel     Component Value Date/Time   CHOL 199 05/09/2013 0811   TRIG 107.0 05/09/2013 0811   HDL 58.90 05/09/2013 0811   CHOLHDL 3 05/09/2013 0811   VLDL 21.4 05/09/2013 0811    LDLCALC 119* 05/09/2013 0811     RADIOLOGY: No results found.  ASSESSMENT AND PLAN: Ms. Perno is an 80 year old female with a long-standing history of asthma who recently has developed increasing shortness of breath. She has noticed several episodes of significant chest pressure and on one instance associated with left arm radiation. When I initially saw her I elected to add low-dose amlodipine at 2.5 mg in light of this chest pressure to induced vasodilation and also added a very low dose cardioselective beta-blockade with bystolic 2.5 mg and suggested that she initiate a baby aspirin 81 mg. her blood pressure today is well controlled. She has stopped taking the amlodipine and the Bystolic due to development of a maculopapular rash which may represent a fixed drug corruption. Prior testing did reveal a normal myocardial perfusion scan and echo Doppler study did show normal systolic function with grade 1 diastolic dysfunction. Her most recent cardiopulmonary that test showed fairly normal maximum oxygen consumption. However, the test was somewhat indeterminate due to to her inability to achieve a maximum heart rate. She was only to able to achieve a heart rate of 103 which was submaximal. Her heart rate response was somewhat blunted. I suspect much of her shortness of breath may be due to reduced aerobic capacity. Pulmonary function studies were within normal limits. I have recommended she change her Spiriva lactulose and 25 mg in the morning to 25 mg twice a day which will help with diuresis. I encouraged increased attempt at exercise. She does have grade 1 diastolic dysfunction noted on her echo Doppler study. Weight loss is also recommended. She will stay off her Bystolic and amlodipine. I have suggested hydrocortisone cream twice a day to apply to her skin rash. She will be following up with Dr. Linna Darner. I will see her in 6 months for cardiology reevaluation.   Troy Sine, MD,  [facility] 07/20/2013 6:32 PM

## 2013-07-20 NOTE — Patient Instructions (Signed)
Your physician has recommended you make the following change in your medication: increase the spironalactone 25 mg to twice daily.   Your physician recommends that you schedule a follow-up appointment in: 6 months.

## 2013-08-02 ENCOUNTER — Other Ambulatory Visit: Payer: Self-pay

## 2013-08-02 DIAGNOSIS — Z1231 Encounter for screening mammogram for malignant neoplasm of breast: Secondary | ICD-10-CM

## 2013-08-15 ENCOUNTER — Ambulatory Visit
Admission: RE | Admit: 2013-08-15 | Discharge: 2013-08-15 | Disposition: A | Discharge status: Home / Self Care | Payer: Medicare Other | Source: Ambulatory Visit

## 2013-08-15 DIAGNOSIS — Z1231 Encounter for screening mammogram for malignant neoplasm of breast: Secondary | ICD-10-CM

## 2013-08-19 ENCOUNTER — Ambulatory Visit (INDEPENDENT_AMBULATORY_CARE_PROVIDER_SITE_OTHER): Payer: Medicare Other | Admitting: Internal Medicine

## 2013-08-19 ENCOUNTER — Encounter: Payer: Self-pay | Admitting: Internal Medicine

## 2013-08-19 VITALS — BP 148/68 | HR 81 | Temp 97.4°F | Resp 15 | Wt 193.4 lb

## 2013-08-19 DIAGNOSIS — S8992XA Unspecified injury of left lower leg, initial encounter: Secondary | ICD-10-CM

## 2013-08-19 DIAGNOSIS — S8990XA Unspecified injury of unspecified lower leg, initial encounter: Secondary | ICD-10-CM

## 2013-08-19 DIAGNOSIS — R590 Localized enlarged lymph nodes: Secondary | ICD-10-CM

## 2013-08-19 DIAGNOSIS — S99919A Unspecified injury of unspecified ankle, initial encounter: Secondary | ICD-10-CM

## 2013-08-19 DIAGNOSIS — L738 Other specified follicular disorders: Secondary | ICD-10-CM

## 2013-08-19 DIAGNOSIS — S99929A Unspecified injury of unspecified foot, initial encounter: Secondary | ICD-10-CM

## 2013-08-19 DIAGNOSIS — S0993XA Unspecified injury of face, initial encounter: Secondary | ICD-10-CM

## 2013-08-19 DIAGNOSIS — S199XXA Unspecified injury of neck, initial encounter: Secondary | ICD-10-CM

## 2013-08-19 DIAGNOSIS — L739 Follicular disorder, unspecified: Secondary | ICD-10-CM

## 2013-08-19 DIAGNOSIS — H8110 Benign paroxysmal vertigo, unspecified ear: Secondary | ICD-10-CM

## 2013-08-19 DIAGNOSIS — R599 Enlarged lymph nodes, unspecified: Secondary | ICD-10-CM

## 2013-08-19 MED ORDER — DIAZEPAM 5 MG PO TABS: ORAL_TABLET | ORAL | Status: DC

## 2013-08-19 NOTE — Patient Instructions (Addendum)
The Physical Therapy referral will be scheduled and you'll be notified of the time. Use warm moist compresses to 3 times a day to the affected area. Perform isometric exercise of calves  ( while seated go up on toes to count of 5 & then onto heels for 5 count). Repeat  4- 5 times prior to standing if you've been seated or supine for any significant period of time as BP drops with such positions.  Consider wedge & foot board as discussed.

## 2013-08-19 NOTE — Progress Notes (Signed)
Subjective:    Patient ID: Mary Roth, female    DOB: 17-Nov-1928, 80 y.o.   MRN: [id number]  HPI Woke up 2 days ago with right cheek tender and swollen. Noticed a swollen lymph node under right mandible. Also felt pain with swallowing which she could feel down to epigastric area. Had sweats and malaise with this. Did not check temperature. Had no heartburn/reflux with this. She applied heat over the past 2 days and cheek swelling/tenderness has mostly resolved.   Diffuse red raised papules over upper back, chest, upper arms began 1 month ago within ten days of starting bystolic, amlodipine, and increasing dose of spironolactone to bid rather than  daily.. She stopped these meds 3 weeks ago but she continues to have the rash. Dr. Claiborne Billings, her Cardiologist, knows she stopped taking these meds. She also reduced her spironolactone to daily rather than bid ; Dr. Claiborne Billings is not aware of this. Saw dermatologist for rash 3days ago and was prescribed kenalog cream which has slightly improved the rash.  Dizziness/fall this am- Had leg cramps in leg this morning at 5 am and she jumped up suddenly and then fell and hit eye on night stand and left thigh on steps for bed. Did not lose consciousness or lose bowel or bladder control. She reports getting up to go to bathroom at 3 am and felt dizzy at this time. She has not had dizziness before today. Still has dizziness when she looks down.   Review of Systems Food not sticking up when she swallows. No tarry stools or blood in stool.  No fever, chills, sweats since 3 days ago. No problems with vision or hearing. No numbness, tingling, or movement deficit in arms and legs.  No congestion, nasal drainage.  No chest pain, cough. Has had worsening shortness of breath over past 2 years.  Denies anxiety depression, panic attacks.       Objective:   Physical Exam  Constitutional: She is oriented to person, place, and time. No distress.  Obese.  HENT:  Head:  Normocephalic and atraumatic.  Mouth/Throat: No oropharyngeal exudate.  Eyes: Pupils are equal, round, and reactive to light. Right eye exhibits no discharge. Left eye exhibits no discharge. No scleral icterus.  Bruising noted around left eye from fall this am. Tender to touch at lateral aspect of lower orbital rim. Visual acuity normal. EOM wnl.  Neck: Normal range of motion. Neck supple. No tracheal deviation present. No thyromegaly present.  Ears: normal exam & tuning fork exam Cardiovascular: Normal rate, regular rhythm and normal heart sounds.  Pulmonary/Chest: Effort normal and breath sounds normal.  Becomes dyspneic from getting up on table and when lying flat.  Abdominal: Soft. Bowel sounds are normal. She exhibits no mass. There is tenderness (LLQ, states from diverticulitis.). There is no guarding.  Lymphadenopathy:  She has no cervical adenopathy & no axillary adenopathy.  Neurological: She is alert and oriented to person, place, and time. No cranial nerve deficit. She exhibits normal muscle tone. Coordination normal. Broad , slow gait.Requires aid in getting on exam table. Has dizziness upon lying flat and sitting up. Cranial nerve exam normal Skin: Skin is warm and dry. She is not diaphoretic. No bruise of thigh.Diffusde papular/follicular rash of posterior trunk.          Assessment & Plan:  #1 benign positional vertigo with possible postural hypotension component #2 cervical lymphadenopathy possibly from parotitis, resolved #3 facial & thigh trauma w/o neurologic or musculoskeletal sequellae #4  follicular dermatitis ; treatment deferred to her Dermatologist

## 2013-08-19 NOTE — Progress Notes (Signed)
   Subjective:    Patient ID: Mary Roth, female    DOB: 1928-07-29, 80 y.o.   MRN: [id number]  HPI Woke up 2 days ago with right cheek tender and swollen. Noticed a swollen lymph node under right mandible. Also felt pain with swallowing which she could feel down to epigastric area. Had  sweats and malaise with this. Did not check temperature. Had no heartburn/reflux with this. She applied heat over the past 2 days and cheek swelling/tenderness has mostly resolved.   Diffuse red raised papules over upper back, chest, upper arms began 1 month ago within ten days of starting bystolic, amlodipine, and increasing dose of spironolactone to bid rather daily.. She stopped these meds 3 weeks ago but she continues to have the rash. Dr. Claiborne Billings know she stopped taking these meds. She also reduced her spironolactone to daily rather than bid and Dr. Claiborne Billings is not aware of this. Saw dermatologist for rash 3days ago and was prescribed kenalog cream which has reduced improved the rash.  Dizziness/fall this am- Had leg cramps in leg this morning at 5 am and she jumped up suddenly and then fell and hit eye on night stand and left thigh on steps for bed. Did not lose consciousness or lose bowel or bladder control. She reports getting up to go to bathroom at 3 am and felt dizzy at this time. She has not had dizziness before today. Still has dizziness when she looks down.  Review of Systems Food not sticking up when she swallows. No tarry stools or blood in stool.  No fever, chills, sweats since 3 days ago. No problems with vision or hearing. No numbness, tingling, or movement deficit in arms and legs. No congestion, nasal drainage. No chest pain, cough. Has had worsening shortness of breath over past 2 years. Denies anxiety depression, panic attacks.    Objective:   Physical Exam  Constitutional: She is oriented to person, place, and time. No distress.  Obese.   HENT:  Head: Normocephalic and atraumatic.    Mouth/Throat: No oropharyngeal exudate.  Eyes: Pupils are equal, round, and reactive to light. Right eye exhibits no discharge. Left eye exhibits no discharge. No scleral icterus.  Bruising noted around left eye from fall this am. Tender to touch at lateral aspect of lower orbital rim. Visual acuity normal. EOM wnl.   Neck: Normal range of motion. Neck supple. No tracheal deviation present. No thyromegaly present.  Cardiovascular: Normal rate, regular rhythm and normal heart sounds.   Pulmonary/Chest: Effort normal and breath sounds normal.  Becomes dyspneic from getting up on table and when lying flat.  Abdominal: Soft. Bowel sounds are normal. She exhibits no mass. There is tenderness (LLQ, states from diverticulitis.). There is no guarding.  Lymphadenopathy:    She has no cervical adenopathy (no axillary adenopathy).  Neurological: She is alert and oriented to person, place, and time. No cranial nerve deficit. She exhibits normal muscle tone. Coordination normal.  Has dizziness upon lying flat and looking up.  Skin: Skin is warm and dry. She is not diaphoretic.          Assessment & Plan:

## 2013-08-19 NOTE — Progress Notes (Signed)
Pre visit review using our clinic review tool, if applicable. No additional management support is needed unless otherwise documented below in the visit note. 

## 2013-08-20 DIAGNOSIS — H8110 Benign paroxysmal vertigo, unspecified ear: Secondary | ICD-10-CM | POA: Insufficient documentation

## 2013-09-28 ENCOUNTER — Ambulatory Visit (INDEPENDENT_AMBULATORY_CARE_PROVIDER_SITE_OTHER): Payer: Medicare Other | Admitting: Internal Medicine

## 2013-09-28 ENCOUNTER — Encounter: Payer: Self-pay | Admitting: Internal Medicine

## 2013-09-28 VITALS — BP 136/76 | HR 86 | Temp 98.3°F | Wt 197.0 lb

## 2013-09-28 DIAGNOSIS — S8012XA Contusion of left lower leg, initial encounter: Secondary | ICD-10-CM

## 2013-09-28 DIAGNOSIS — S8010XA Contusion of unspecified lower leg, initial encounter: Secondary | ICD-10-CM

## 2013-09-28 NOTE — Patient Instructions (Signed)
Apply ice at night at the area of the pain Take Tylenol as needed If you are not improving in the next 2 weeks--->  please let us know.

## 2013-09-28 NOTE — Progress Notes (Signed)
Subjective:    Patient ID: Mary Roth, female    DOB: [birth year], 80 y.o.   MRN: [id number]  DOS:  09/28/2013 Type of  visit: Acute visit She had a fall 08/17/2013, saw PCP 2 days later, had face and left leg  injury. Overall feels better, face improved. Left thigh swelling has decreased, bruise resolved, but is still has mild- persistent pain, the pain is not  worse when she walks but increase  when she applies pressure (like turning to the L in bed).    ROS No further falls Still gets occasional dizzy when she turns in bed in the last few seconds but that is also better. Has mild back pain but is at baseline. No lower extremity paresthesias.   Past Medical History  Diagnosis Date  . Personal history of colonic polyps 08/21/2006    ADENOMATOUS POLYP  . Hepatic steatosis   . Abnormal liver function test   . Mastodynia   . Unspecified essential hypertension   . Other and unspecified hyperlipidemia   . Esophageal reflux   . Unspecified asthma(493.90) 2003     no childhood asthma  . Edema   . Unspecified hypothyroidism   . Arthritis   . Osteopenia 03/2012    T score -1.4 FRAX 20%/11%; Dr Phineas Real    Past Surgical History  Procedure Laterality Date  . Appendectomy    . Cholecystectomy  1973     for stones  . Tonsillectomy    . Uterine suspension  [place]    History   Social History  . Marital Status: Married    Spouse Name: N/A    Number of Children: 2  . Years of Education: N/A   Occupational History  . homemaker    Social History Main Topics  . Smoking status: Never Smoker   . Smokeless tobacco: Never Used  . Alcohol Use: 2.4 oz/week    4 Shots of liquor per week     Comment:  socially  . Drug Use: No  . Sexual Activity: No   Other Topics Concern  . Not on file   Social History Narrative  . No narrative on file        Medication List       This list is accurate as of: 09/28/13  7:47 PM.  Always use your most recent med  list.               ADVAIR DISKUS 250-50 MCG/DOSE Aepb  Generic drug:  Fluticasone-Salmeterol  USE 1 INHALATION BY MOUTHEVERY 12 HOURS GARGLE ANDSPIT AFTER EACH USE     albuterol (2.5 MG/3ML) 0.083% nebulizer solution  Commonly known as:  PROVENTIL  Take 3 mLs (2.5 mg total) by nebulization every 4 (four) hours as needed for wheezing.     albuterol 108 (90 BASE) MCG/ACT inhaler  Commonly known as:  PROVENTIL HFA;VENTOLIN HFA  Inhale 2 puffs into the lungs every 6 (six) hours as needed for wheezing or shortness of breath.     amLODipine 2.5 MG tablet  Commonly known as:  NORVASC  Take 1 tablet (2.5 mg total) by mouth daily.     aspirin 81 MG tablet  Take 81 mg by mouth daily.     beclomethasone 80 MCG/ACT inhaler  Commonly known as:  QVAR  Inhale 2 puffs into the lungs 2 (two) times daily.     CALTRATE 600+D PLUS PO  Take by mouth daily.  CRESTOR 5 MG tablet  Generic drug:  rosuvastatin  TAKE 1 TABLET BY MOUTH ONCE A DAY     fexofenadine 180 MG tablet  Commonly known as:  ALLEGRA  Take 180 mg by mouth daily as needed.     fluticasone 50 MCG/ACT nasal spray  Commonly known as:  FLONASE  Place 1 spray into the nose 2 (two) times daily as needed for rhinitis.     levothyroxine 88 MCG tablet  Commonly known as:  SYNTHROID, LEVOTHROID  TAKE 1 TABLET BY MOUTH ONCE A DAY     montelukast 10 MG tablet  Commonly known as:  SINGULAIR  TAKE 1 TABLET BY MOUTH ONCE A DAY AS NEEDED     nebivolol 2.5 MG tablet  Commonly known as:  BYSTOLIC  Take 1 tablet (2.5 mg total) by mouth daily.     NEXIUM 40 MG capsule  Generic drug:  esomeprazole  TAKE 1 CAPSULE BY MOUTH ONCE A DAY     spironolactone 25 MG tablet  Commonly known as:  ALDACTONE  Take 1 tablet (25 mg total) by mouth 2 (two) times daily.     triamcinolone cream 0.1 %  Commonly known as:  KENALOG  Apply 1 application topically 3 (three) times daily as needed.           Objective:   Physical Exam    Musculoskeletal:       Legs:  BP 136/76  Pulse 86  Temp(Src) 98.3 F (36.8 C) (Oral)  Wt 197 lb (89.359 kg)  SpO2 92% General -- alert, well-developed, NAD.   Extremities-- no pretibial edema bilaterally ; hip ROM wnl w/o pain. Gait normal. slt tender at L trochanteric bursa?  Neurologic--  alert & oriented X3. Speech normal, gait normal, strength normal in all extremities. Psych-- Cognition and judgment appear intact. Cooperative with normal attention span and concentration. No anxious or depressed appearing.        Assessment & Plan:   Leg contusion Patient is recovering from a leg contusion, still slightly tender at the left tight but overall swelling and ecchymoses are essentially resolved. I discussed possibly an x-ray but the patient does not like to proceed right now, we agreed to put ice, take Tylenol and if she's not better will order the x-ray and possibly a sports medicine referral,  trochanteric bursitis?[name]  Also has a rash, initially she thought it was related to spironolactone or bystolic, has seen dermatology, they prescribe a cream but that is not helping. Recommend to discuss with dermatology again

## 2013-10-07 ENCOUNTER — Other Ambulatory Visit: Payer: Self-pay | Admitting: Dermatology

## 2013-10-12 ENCOUNTER — Encounter: Payer: Self-pay | Admitting: Internal Medicine

## 2013-10-12 DIAGNOSIS — L439 Lichen planus, unspecified: Secondary | ICD-10-CM | POA: Insufficient documentation

## 2013-10-19 ENCOUNTER — Ambulatory Visit (INDEPENDENT_AMBULATORY_CARE_PROVIDER_SITE_OTHER): Payer: Medicare Other | Admitting: Gastroenterology

## 2013-10-19 ENCOUNTER — Telehealth: Payer: Self-pay | Admitting: Gastroenterology

## 2013-10-19 ENCOUNTER — Encounter: Payer: Self-pay | Admitting: Gastroenterology

## 2013-10-19 ENCOUNTER — Other Ambulatory Visit (INDEPENDENT_AMBULATORY_CARE_PROVIDER_SITE_OTHER): Payer: Medicare Other

## 2013-10-19 VITALS — BP 120/78 | HR 80 | Ht 58.5 in | Wt 195.1 lb

## 2013-10-19 DIAGNOSIS — K625 Hemorrhage of anus and rectum: Secondary | ICD-10-CM

## 2013-10-19 DIAGNOSIS — R1032 Left lower quadrant pain: Secondary | ICD-10-CM

## 2013-10-19 DIAGNOSIS — K649 Unspecified hemorrhoids: Secondary | ICD-10-CM | POA: Insufficient documentation

## 2013-10-19 LAB — BASIC METABOLIC PANEL
BUN: 17 mg/dL (ref 6–23)
CALCIUM: 9.3 mg/dL (ref 8.4–10.5)
CO2: 30 mEq/L (ref 19–32)
Chloride: 101 mEq/L (ref 96–112)
Creatinine, Ser: 0.9 mg/dL (ref 0.4–1.2)
GFR: 62.76 mL/min (ref >60.00)
Glucose, Bld: 95 mg/dL (ref 70–99)
Potassium: 4.5 mEq/L (ref 3.5–5.1)
Sodium: 139 mEq/L (ref 135–145)

## 2013-10-19 LAB — CBC
HCT: 44.1 % (ref 36.0–46.0)
Hemoglobin: 14.9 g/dL (ref 12.0–15.0)
MCHC: 33.8 g/dL (ref 30.0–36.0)
MCV: 93.3 fl (ref 78.0–100.0)
Platelets: 222 10*3/uL (ref 150.0–400.0)
RBC: 4.73 Mil/uL (ref 3.87–5.11)
RDW: 14.3 % (ref 11.5–15.5)
WBC: 11.9 10*3/uL — ABNORMAL HIGH (ref 4.0–10.5)

## 2013-10-19 MED ORDER — HYDROCORTISONE ACETATE 25 MG RE SUPP: 25.0000 mg | Freq: Two times a day (BID) | RECTAL | Status: DC

## 2013-10-19 NOTE — Patient Instructions (Signed)
You have been scheduled for a CT scan of the abdomen and pelvis at [facility] ([address]---this is in the same building as [facility]).   You are scheduled on 10-25-2013 at 1030 am. You should arrive 15 minutes prior to your appointment time for registration. Please follow the written instructions below on the day of your exam:  WARNING: IF YOU ARE ALLERGIC TO IODINE/X-RAY DYE, PLEASE NOTIFY RADIOLOGY IMMEDIATELY AT [phone]! YOU WILL BE GIVEN A 13 HOUR PREMEDICATION PREP.  1) Do not eat or drink anything after 630 am (4 hours prior to your test) 2) You have been given 2 bottles of oral contrast to drink. The solution may taste better if refrigerated, but do NOT add ice or any other liquid to this solution. Shake well before drinking.    Drink 1 bottle of contrast @ 830 am (2 hours prior to your exam)  Drink 1 bottle of contrast @ 930 am (1 hour prior to your exam)  You may take any medications as prescribed with a small amount of water except for the following: Metformin, Glucophage, Glucovance, Avandamet, Riomet, Fortamet, Actoplus Met, Janumet, Glumetza or Metaglip. The above medications must be held the day of the exam AND 48 hours after the exam.  The purpose of you drinking the oral contrast is to aid in the visualization of your intestinal tract. The contrast solution may cause some diarrhea. Before your exam is started, you will be given a small amount of fluid to drink. Depending on your individual set of symptoms, you may also receive an intravenous injection of x-ray contrast/dye. Plan on being at [facility] for 30 minutes or long, depending on the type of exam you are having performed.  This test typically takes 30-45 minutes to complete.  If you have any questions regarding your exam or if you need to reschedule, you may call the CT department at [phone] between the hours of 8:00 am and 5:00 pm,  Monday-Friday.  ________________________________________________________________________  Your physician has requested that you go to the basement for the following lab work before leaving today: CBC  BMET  We have sent the following medications to your pharmacy for you to pick up at your convenience: Hydrocortisone Suppositories, place suppository rectally twice daily for ten days

## 2013-10-19 NOTE — Progress Notes (Signed)
Reviewed and agree with management plan.  Malcolm T. Stark, MD FACG 

## 2013-10-19 NOTE — Telephone Encounter (Signed)
Pt states she has been having bright red blood and some dark blood from the rectum. Pt scheduled to see Alonza Bogus PA today at 3pm. Pt aware.

## 2013-10-19 NOTE — Progress Notes (Signed)
     10/19/2013 Mary Roth [id number] [birth year]   History of Present Illness:  This is a pleasant 80 year old female who is previously known to Dr. Sharlett Iles for prior colonoscopies.  Her last colonoscopy was in October 2013 at which time she had 4 sessile polyps measuring 3-5 mm removed throughout her colon; these were tubular adenomas. She also was noted to have moderate diverticulosis in the descending and sigmoid colon.  She comes to our office today with complaints of rectal bleeding. She says that she initially began seeing blood approximately 5 weeks ago and it was intermittent for a couple of weeks, but now for the last 3 weeks she has been seeing it pretty much every day. It is mostly bright red in color, but there was some darker blood at times as well. It is usually with bowel movements, but she has had some leak into her undergarments/pants on one occasion as well. It is never a large amount.  She does admit that she has mucus in her stool at times as well. She has tried Preparation H for a short time. She also mentions that she intermittently experiences left-sided (lower) abdominal pains, particularly after she is eating popcorn. The pain has been on and off for quite some time, and it has been bothering her recently. She denies any nausea, vomiting, and fevers.   Current Medications, Allergies, Past Medical History, Past Surgical History, Family History and Social History were reviewed in [employer] record.   Physical Exam: BP 120/78  Pulse 80  Ht 4' 10.5" (1.486 m)  Wt 195 lb 2 oz (88.508 kg)  BMI 40.08 kg/m2 General: Well developed white female in no acute distress Head: Normocephalic and atraumatic Eyes:  Sclerae anicteric, conjunctiva pink  Ears: Normal auditory acuity Lungs: Clear throughout to auscultation Heart: Regular rate and rhythm Abdomen: Soft, non-distended.  Normal bowel sounds.  Left-sided TTP without R/R/G. Rectal:  External exam  revealed hemorrhoids, one of which had stigmata of recent bleeding.  DRE did not reveal any masses.  Small amount of soft stool in rectum.  Light brown stool seen on exam glove without blood.  Heme negative. Musculoskeletal: Symmetrical with no gross deformities  Extremities: No edema  Neurological: Alert oriented x 4, grossly non-focal Psychological:  Alert and cooperative. Normal mood and affect  Assessment and Recommendations: -Rectal bleeding:  Likely hemorrhoidal as per exam.  Will treat with hydrocortisone suppositories BID for 10 days. -Left sided abdominal pain:  Apparently this same pain comes and goes, but she is tender on exam today.  Will check CBC and BMP along with CT scan of the abdomen and pelvis with contrast.  Rule out diverticulitis, etc.  I doubt this is related to the rectal bleeding since she has had this same pain in the past and there was anorectal source identified that could account for bleeding.

## 2013-10-25 ENCOUNTER — Ambulatory Visit (INDEPENDENT_AMBULATORY_CARE_PROVIDER_SITE_OTHER)
Admission: RE | Admit: 2013-10-25 | Discharge: 2013-10-25 | Disposition: A | Discharge status: Home / Self Care | Payer: Medicare Other | Source: Ambulatory Visit | Attending: Gastroenterology | Admitting: Gastroenterology

## 2013-10-25 DIAGNOSIS — R1032 Left lower quadrant pain: Secondary | ICD-10-CM

## 2013-10-25 MED ORDER — IOHEXOL 300 MG/ML  SOLN
100.0000 mL | Freq: Once | INTRAMUSCULAR | Status: AC | PRN
  Administered 2013-10-25: 100 mL via INTRAVENOUS

## 2013-11-03 ENCOUNTER — Other Ambulatory Visit: Payer: Self-pay | Admitting: Internal Medicine

## 2013-12-18 ENCOUNTER — Emergency Department (HOSPITAL_BASED_OUTPATIENT_CLINIC_OR_DEPARTMENT_OTHER): Payer: Medicare Other

## 2013-12-18 ENCOUNTER — Encounter (HOSPITAL_BASED_OUTPATIENT_CLINIC_OR_DEPARTMENT_OTHER): Payer: Self-pay | Admitting: Emergency Medicine

## 2013-12-18 ENCOUNTER — Emergency Department (HOSPITAL_BASED_OUTPATIENT_CLINIC_OR_DEPARTMENT_OTHER)
Admission: EM | Admit: 2013-12-18 | Discharge: 2013-12-18 | Disposition: A | Discharge status: Home / Self Care | Payer: Medicare Other | Attending: Emergency Medicine | Admitting: Emergency Medicine

## 2013-12-18 DIAGNOSIS — Z8742 Personal history of other diseases of the female genital tract: Secondary | ICD-10-CM | POA: Insufficient documentation

## 2013-12-18 DIAGNOSIS — M545 Low back pain, unspecified: Secondary | ICD-10-CM | POA: Diagnosis not present

## 2013-12-18 DIAGNOSIS — Z8719 Personal history of other diseases of the digestive system: Secondary | ICD-10-CM | POA: Insufficient documentation

## 2013-12-18 DIAGNOSIS — Z860100 Personal history of colon polyps, unspecified: Secondary | ICD-10-CM | POA: Insufficient documentation

## 2013-12-18 DIAGNOSIS — I10 Essential (primary) hypertension: Secondary | ICD-10-CM | POA: Diagnosis not present

## 2013-12-18 DIAGNOSIS — Z8739 Personal history of other diseases of the musculoskeletal system and connective tissue: Secondary | ICD-10-CM | POA: Diagnosis not present

## 2013-12-18 DIAGNOSIS — IMO0002 Reserved for concepts with insufficient information to code with codable children: Secondary | ICD-10-CM | POA: Diagnosis not present

## 2013-12-18 DIAGNOSIS — J45909 Unspecified asthma, uncomplicated: Secondary | ICD-10-CM | POA: Insufficient documentation

## 2013-12-18 DIAGNOSIS — M549 Dorsalgia, unspecified: Secondary | ICD-10-CM | POA: Diagnosis present

## 2013-12-18 DIAGNOSIS — Z872 Personal history of diseases of the skin and subcutaneous tissue: Secondary | ICD-10-CM | POA: Insufficient documentation

## 2013-12-18 DIAGNOSIS — E039 Hypothyroidism, unspecified: Secondary | ICD-10-CM | POA: Insufficient documentation

## 2013-12-18 DIAGNOSIS — Z79899 Other long term (current) drug therapy: Secondary | ICD-10-CM | POA: Diagnosis not present

## 2013-12-18 LAB — URINE MICROSCOPIC-ADD ON

## 2013-12-18 LAB — URINALYSIS, ROUTINE W REFLEX MICROSCOPIC
Bilirubin Urine: NEGATIVE
GLUCOSE, UA: NEGATIVE mg/dL
Hgb urine dipstick: NEGATIVE
Ketones, ur: NEGATIVE mg/dL
Nitrite: NEGATIVE
PH: 5.5 (ref 5.0–8.0)
PROTEIN: NEGATIVE mg/dL
Specific Gravity, Urine: 1.018 (ref 1.005–1.030)
Urobilinogen, UA: 0.2 mg/dL (ref 0.0–1.0)

## 2013-12-18 MED ORDER — CYCLOBENZAPRINE HCL 10 MG PO TABS
5.0000 mg | ORAL_TABLET | Freq: Once | ORAL | Status: AC
  Administered 2013-12-18: 5 mg via ORAL
  Filled 2013-12-18: qty 1

## 2013-12-18 MED ORDER — HYDROCODONE-ACETAMINOPHEN 5-325 MG PO TABS: 1.0000 | ORAL_TABLET | Freq: Four times a day (QID) | ORAL | Status: DC | PRN

## 2013-12-18 MED ORDER — HYDROCODONE-ACETAMINOPHEN 5-325 MG PO TABS
1.0000 | ORAL_TABLET | Freq: Once | ORAL | Status: AC
  Administered 2013-12-18: 1 via ORAL
  Filled 2013-12-18: qty 1

## 2013-12-18 NOTE — ED Provider Notes (Signed)
CSN: [id number]     Arrival date & time 12/18/13  0806 History   First MD Initiated Contact with Patient 12/18/13 [phone]     Chief Complaint  Patient presents with  . Back Pain     (Consider location/radiation/quality/duration/timing/severity/associated sxs/prior Treatment) Patient is a 80 y.o. female presenting with back pain.  Back Pain Location:  Lumbar spine Quality:  Aching Radiates to:  Does not radiate Pain severity:  Moderate Pain is:  Same all the time Onset quality:  Gradual Duration:  1 day Timing:  Constant Progression:  Worsening Chronicity:  New Context: not recent injury and not twisting   Relieved by:  Heating pad Worsened by:  Nothing tried Associated symptoms: no fever     Past Medical History  Diagnosis Date  . Personal history of colonic polyps 08/21/2006    ADENOMATOUS POLYP  . Hepatic steatosis   . Abnormal liver function test   . Mastodynia   . Unspecified essential hypertension   . Other and unspecified hyperlipidemia   . Esophageal reflux   . Unspecified asthma(493.90) 2003     no childhood asthma  . Edema   . Unspecified hypothyroidism   . Arthritis   . Osteopenia 03/2012    T score -1.4 FRAX 20%/11%; Dr Phineas Real   Past Surgical History  Procedure Laterality Date  . Appendectomy    . Cholecystectomy  1973     for stones  . Tonsillectomy    . Uterine suspension  1968    [place] , [place]   Family History  Problem Relation Age of Onset  . Uterine cancer Mother   . Ovarian cancer Mother   . Heart attack Father 32  . Prostate cancer Father   . Breast cancer Sister     x 3 sisters  . Asthma Sister   . Alcohol abuse Brother     x 3  . Prostate cancer Brother     x 3 brothers  . Pancreatic cancer Brother     x 2  . Diabetes Neg Hx   . Stroke Neg Hx    History  Substance Use Topics  . Smoking status: Never Smoker   . Smokeless tobacco: Never Used  . Alcohol Use: 2.4 oz/week    4 Shots of liquor per week     Comment:  socially    OB History   Grav Para Term Preterm Abortions TAB SAB Ect Mult Living   2 2 2       2      Review of Systems  Constitutional: Negative for fever and chills.  Respiratory: Negative for shortness of breath.   Musculoskeletal: Positive for back pain.  All other systems reviewed and are negative.     Allergies  Ciprofloxacin; Phenergan; Bystolic; and Norvasc  Home Medications   Prior to Admission medications   Medication Sig Start Date End Date Taking? Authorizing Provider  ADVAIR DISKUS 250-50 MCG/DOSE AEPB USE 1 INHALATION BY MOUTHEVERY 12 HOURS GARGLE ANDSPIT AFTER EACH USE 05/25/13   Hendricks Limes, MD  albuterol (PROVENTIL HFA;VENTOLIN HFA) 108 (90 BASE) MCG/ACT inhaler Inhale 2 puffs into the lungs every 6 (six) hours as needed for wheezing or shortness of breath. 06/09/13   Hendricks Limes, MD  albuterol (PROVENTIL) (2.5 MG/3ML) 0.083% nebulizer solution Take 3 mLs (2.5 mg total) by nebulization every 4 (four) hours as needed for wheezing. 09/01/11   Hendricks Limes, MD  beclomethasone (QVAR) 80 MCG/ACT inhaler Inhale 2 puffs into the lungs 2 (  two) times daily. 09/05/11   Hendricks Limes, MD  Calcium Carbonate-Vit D-Min (CALTRATE 600+D PLUS PO) Take by mouth daily.    Historical Provider, MD  CRESTOR 5 MG tablet TAKE 1 TABLET BY MOUTH ONCE A DAY    Hendricks Limes, MD  fexofenadine (ALLEGRA) 180 MG tablet Take 180 mg by mouth daily as needed.      Historical Provider, MD  fluticasone (FLONASE) 50 MCG/ACT nasal spray Place 1 spray into the nose 2 (two) times daily as needed for rhinitis. 02/03/13   Hendricks Limes, MD  hydrocortisone (ANUSOL-HC) 25 MG suppository Place 1 suppository (25 mg total) rectally 2 (two) times daily. 10/19/13   Laban Emperor. Zehr, PA-C  levothyroxine (SYNTHROID, LEVOTHROID) 88 MCG tablet TAKE 1 TABLET BY MOUTH ONCE A DAY 11/03/13   Hendricks Limes, MD  montelukast (SINGULAIR) 10 MG tablet TAKE 1 TABLET BY MOUTH ONCE A DAY AS NEEDED 04/29/12   Hendricks Limes, MD  NEXIUM 40 MG capsule TAKE 1 CAPSULE BY MOUTH ONCE A DAY 10/01/12   Hendricks Limes, MD  spironolactone (ALDACTONE) 25 MG tablet Take 1 tablet (25 mg total) by mouth 2 (two) times daily. 07/20/13   Troy Sine, MD  triamcinolone cream (KENALOG) 0.1 % Apply 1 application topically 3 (three) times daily as needed. 08/16/13   Historical Provider, MD   BP 166/75  Pulse 87  Temp(Src) 98.1 F (36.7 C) (Oral)  Resp 18  Ht 4\' 10"  (1.473 m)  Wt 208 lb (94.348 kg)  BMI 43.48 kg/m2  SpO2 97% Physical Exam  Nursing note and vitals reviewed. Constitutional: She is oriented to person, place, and time. She appears well-developed and well-nourished. No distress.  HENT:  Head: Normocephalic and atraumatic.  Eyes: EOM are normal. Pupils are equal, round, and reactive to light.  Neck: Normal range of motion. Neck supple.  Cardiovascular: Normal rate and regular rhythm.  Exam reveals no friction rub.   No murmur heard. Pulmonary/Chest: Effort normal and breath sounds normal. No respiratory distress. She has no wheezes. She has no rales.  Abdominal: Soft. She exhibits no distension. There is no tenderness. There is no rebound.  Musculoskeletal: Normal range of motion. She exhibits no edema.       Lumbar back: She exhibits tenderness (L lower back pain).  Neurological: She is alert and oriented to person, place, and time.  Skin: She is not diaphoretic.    ED Course  Procedures (including critical care time) Labs Review Labs Reviewed  URINALYSIS, ROUTINE W REFLEX MICROSCOPIC - Abnormal; Notable for the following:    APPearance CLOUDY (*)    Leukocytes, UA SMALL (*)    All other components within normal limits  URINE MICROSCOPIC-ADD ON - Abnormal; Notable for the following:    Squamous Epithelial / LPF MANY (*)    Bacteria, UA MANY (*)    Casts HYALINE CASTS (*)    All other components within normal limits    Imaging Review Dg Lumbar Spine Complete  12/18/2013   CLINICAL DATA:  Low  back pain.  EXAM: LUMBAR SPINE - COMPLETE 4+ VIEW  COMPARISON:  CT of the abdomen and pelvis on 10/25/2013  FINDINGS: There is evidence of lumbar spondylosis throughout the lumbar spine. The L1-2 level shows mild to moderate disc space narrowing. The L2-3 level shows moderate disc space narrowing and a a roughly 5 mm retrolisthesis of L2 on L3. The L3-4 level shows mild facet hypertrophy. The L4-5 level shows mild  anterolisthesis of L4 on L5 of approximately 4 mm with facet hypertrophy. The L5-S1 level shows facet hypertrophy. Mild leftward convex scoliosis present. No fracture or bony lesion identified.  IMPRESSION: Diffuse lumbar spondylosis. Mild listhesis is identified at L2-3 and L4-5.   Electronically Signed   By: Aletta Edouard M.D.   On: 12/18/2013 09:42     EKG Interpretation None      MDM   Final diagnoses:  Left-sided low back pain without sciatica    48F here with L lower back pain. Began yesterday while walking in the grocery store, worse with movement, bending. Mild relief with heating pad, but no relief with NSAIDs. No dizziness, chest pain, abdominal pain, SOB. No dysuria, hematuria. No previous hx of back pain like this. Here stable vitals. No abdominal pain. Neuro exam of lower extremities normal. L lower back pain on palpation, no bony tenderness. No incontinence/retention of urine or stool. Urine contaminated on labs.  No concern for cord compression. Will treat with pain meds, will xray back. Review of CT Abd/pelvis from June without evidence of AAA. Xray ok. Feeling better after pain meds. Urine appears contaminated, denies urinary symptoms. Instructed to f/u with PCP if pain persists.  I have reviewed all labs and imaging and considered them in my medical decision making.   Evelina Bucy, MD 12/18/13 1016

## 2013-12-18 NOTE — ED Notes (Addendum)
Patient here with ongoing lower back pain for several days that has been increasing and last pm became unbearable. Reports that she had CT for same and hematuria back in June and cysts found in kidneys. No further evaluation. Has not had MRI. Pain with any movement. Took Aleve last pm with no relief. Requires assistance with any movement.

## 2013-12-18 NOTE — Discharge Instructions (Signed)
Back Pain, Adult Low back pain is very common. About 1 in 5 people have back pain.The cause of low back pain is rarely dangerous. The pain often gets better over time.About half of people with a sudden onset of back pain feel better in just 2 weeks. About 8 in 10 people feel better by 6 weeks.  CAUSES Some common causes of back pain include:  Strain of the muscles or ligaments supporting the spine.  Wear and tear (degeneration) of the spinal discs.  Arthritis.  Direct injury to the back. DIAGNOSIS Most of the time, the direct cause of low back pain is not known.However, back pain can be treated effectively even when the exact cause of the pain is unknown.Answering your caregiver's questions about your overall health and symptoms is one of the most accurate ways to make sure the cause of your pain is not dangerous. If your caregiver needs more information, he or she may order lab work or imaging tests (X-rays or MRIs).However, even if imaging tests show changes in your back, this usually does not require surgery. HOME CARE INSTRUCTIONS For many people, back pain returns.Since low back pain is rarely dangerous, it is often a condition that people can learn to manageon their own.   Remain active. It is stressful on the back to sit or stand in one place. Do not sit, drive, or stand in one place for more than 30 minutes at a time. Take short walks on level surfaces as soon as pain allows.Try to increase the length of time you walk each day.  Do not stay in bed.Resting more than 1 or 2 days can delay your recovery.  Do not avoid exercise or work.Your body is made to move.It is not dangerous to be active, even though your back may hurt.Your back will likely heal faster if you return to being active before your pain is gone.  Pay attention to your body when you bend and lift. Many people have less discomfortwhen lifting if they bend their knees, keep the load close to their bodies,and  avoid twisting. Often, the most comfortable positions are those that put less stress on your recovering back.  Find a comfortable position to sleep. Use a firm mattress and lie on your side with your knees slightly bent. If you lie on your back, put a pillow under your knees.  Only take over-the-counter or prescription medicines as directed by your caregiver. Over-the-counter medicines to reduce pain and inflammation are often the most helpful.Your caregiver may prescribe muscle relaxant drugs.These medicines help dull your pain so you can more quickly return to your normal activities and healthy exercise.  Put ice on the injured area.  Put ice in a plastic bag.  Place a towel between your skin and the bag.  Leave the ice on for 15-20 minutes, 03-04 times a day for the first 2 to 3 days. After that, ice and heat may be alternated to reduce pain and spasms.  Ask your caregiver about trying back exercises and gentle massage. This may be of some benefit.  Avoid feeling anxious or stressed.Stress increases muscle tension and can worsen back pain.It is important to recognize when you are anxious or stressed and learn ways to manage it.Exercise is a great option. SEEK MEDICAL CARE IF:  You have pain that is not relieved with rest or medicine.  You have pain that does not improve in 1 week.  You have new symptoms.  You are generally not feeling well. SEEK   IMMEDIATE MEDICAL CARE IF:   You have pain that radiates from your back into your legs.  You develop new bowel or bladder control problems.  You have unusual weakness or numbness in your arms or legs.  You develop nausea or vomiting.  You develop abdominal pain.  You feel faint. Document Released: 04/28/2005 Document Revised: 10/28/2011 Document Reviewed: 08/30/2013 Exit[employer] Patient Information 2015 [employer]. This information is not intended to replace advice given to you by your health care provider. Make sure you  discuss any questions you have with your health care provider.  

## 2013-12-20 ENCOUNTER — Ambulatory Visit (INDEPENDENT_AMBULATORY_CARE_PROVIDER_SITE_OTHER): Payer: Medicare Other | Admitting: Internal Medicine

## 2013-12-20 ENCOUNTER — Encounter: Payer: Self-pay | Admitting: Internal Medicine

## 2013-12-20 VITALS — BP 131/70 | HR 91 | Temp 98.2°F | Wt 200.4 lb

## 2013-12-20 DIAGNOSIS — M4317 Spondylolisthesis, lumbosacral region: Secondary | ICD-10-CM | POA: Insufficient documentation

## 2013-12-20 DIAGNOSIS — M47817 Spondylosis without myelopathy or radiculopathy, lumbosacral region: Secondary | ICD-10-CM | POA: Insufficient documentation

## 2013-12-20 DIAGNOSIS — M4310 Spondylolisthesis, site unspecified: Secondary | ICD-10-CM

## 2013-12-20 NOTE — Patient Instructions (Signed)
The non operative Back Specialist referral will be scheduled and you'll be notified of the time.

## 2013-12-20 NOTE — Progress Notes (Signed)
Pre visit review using our clinic review tool, if applicable. No additional management support is needed unless otherwise documented below in the visit note. 

## 2013-12-20 NOTE — Progress Notes (Signed)
   Subjective:    Patient ID: Mary Roth, female    DOB: [birth year], 80 y.o.   MRN: [id number]  HPI     The 12/18/13 ER visit was reviewed. She presented with acute low back pain without specific injury or associated repetitive motion as a trigger.  Films revealed left spondylosis with spondylolisthesis L2 on L3 and L4 on L5. At L4-5 there is 4 mm spondylolisthesis with facet arthropathy. Also scoliosis is noted  A CT scan done in June of the pelvis and abdomen were reviewed. That revealed no significant pathology or aortic aneurysms.  She continues to have left lumbosacral pain which radiates to the mid inferior lumbosacral spine above the buttocks.    Review of Systems  Fever, chills, sweats, or unexplained weight loss not present. Vertigo, near syncope or imbalance denied. There is no numbness, tingling, or weakness in extremities   No loss of control of bladder or bowels. Radicular type pain absent. Dysuria, pyuria, or  hematuria are denied.       Objective:   Physical Exam  Positive for significant findings include: Gait is broad-based and deliberate. She is unable to perform heel or toe walking due to instability. She is able to get on the table only with help. She exhibits a classic low back crawl as sshe lies flat and sits up.  There is some decrease in range of motion of the lower extremities, but good strength and tone present. Deep tendon reflexes are also equal. She has negative straight leg raising bilaterally.  General appearance :adequately nourished; in no distress. As per CDC Guidelines ,Epic documents obesity as being present .  Eyes: No conjunctival inflammation or scleral icterus is present.   Heart:  Normal rate and regular rhythm. S1 and S2 normal without gallop, murmur, click, rub or other extra sounds     Lungs:Chest clear to auscultation; no wheezes, rhonchi,rales ,or rubs present.No increased work of breathing.   Abdomen: bowel sounds  normal, soft and non-tender without masses, organomegaly or hernias noted.  No guarding or rebound. No flank tenderness to percussion.  Skin:Warm & dry.  Intact without suspicious lesions or rashes ; no jaundice or tenting  Lymphatic: No lymphadenopathy is noted about the head, neck, axilla              Assessment & Plan:  #1 lumbosacral spondylosis and spondylolisthesis with 4 mm misalignment  #2 scoliosis  Plan: Referral to nonoperative back specialist

## 2013-12-28 ENCOUNTER — Other Ambulatory Visit (HOSPITAL_BASED_OUTPATIENT_CLINIC_OR_DEPARTMENT_OTHER): Payer: Self-pay | Admitting: Rehabilitation

## 2013-12-28 DIAGNOSIS — M5442 Lumbago with sciatica, left side: Secondary | ICD-10-CM

## 2013-12-30 ENCOUNTER — Telehealth: Payer: Self-pay | Admitting: Internal Medicine

## 2013-12-30 ENCOUNTER — Telehealth: Payer: Self-pay | Admitting: Gastroenterology

## 2013-12-30 NOTE — Telephone Encounter (Signed)
Spoke with patient and told her that we recommended that her PCP follow up on this finding. Patient states she has tried to call him but cannot get him. She will try to reach him.

## 2013-12-30 NOTE — Telephone Encounter (Signed)
Patient is having an MRI in the morning at 12:30 at [facility].  She is requesting Dr. Linna Darner to place an order for the MRI that he is wanting done as well.

## 2013-12-30 NOTE — Telephone Encounter (Signed)
The non operative back specialist will order MRI if indicated based on his evaluation

## 2013-12-31 ENCOUNTER — Ambulatory Visit (HOSPITAL_BASED_OUTPATIENT_CLINIC_OR_DEPARTMENT_OTHER)
Admission: RE | Admit: 2013-12-31 | Discharge: 2013-12-31 | Disposition: A | Discharge status: Home / Self Care | Payer: Medicare Other | Source: Ambulatory Visit | Attending: Rehabilitation | Admitting: Rehabilitation

## 2013-12-31 DIAGNOSIS — M545 Low back pain, unspecified: Secondary | ICD-10-CM | POA: Insufficient documentation

## 2013-12-31 DIAGNOSIS — M48061 Spinal stenosis, lumbar region without neurogenic claudication: Secondary | ICD-10-CM | POA: Insufficient documentation

## 2013-12-31 DIAGNOSIS — M5126 Other intervertebral disc displacement, lumbar region: Secondary | ICD-10-CM | POA: Diagnosis not present

## 2013-12-31 DIAGNOSIS — M5442 Lumbago with sciatica, left side: Secondary | ICD-10-CM

## 2013-12-31 DIAGNOSIS — M51379 Other intervertebral disc degeneration, lumbosacral region without mention of lumbar back pain or lower extremity pain: Secondary | ICD-10-CM | POA: Insufficient documentation

## 2013-12-31 DIAGNOSIS — M5137 Other intervertebral disc degeneration, lumbosacral region: Secondary | ICD-10-CM | POA: Insufficient documentation

## 2014-02-13 ENCOUNTER — Other Ambulatory Visit: Payer: Self-pay | Admitting: Internal Medicine

## 2014-03-13 ENCOUNTER — Encounter: Payer: Self-pay | Admitting: Internal Medicine

## 2014-04-26 ENCOUNTER — Other Ambulatory Visit: Payer: Self-pay | Admitting: Cardiovascular Disease

## 2014-05-01 ENCOUNTER — Ambulatory Visit (INDEPENDENT_AMBULATORY_CARE_PROVIDER_SITE_OTHER): Payer: Medicare Other | Admitting: Internal Medicine

## 2014-05-01 ENCOUNTER — Encounter: Payer: Self-pay | Admitting: Internal Medicine

## 2014-05-01 ENCOUNTER — Other Ambulatory Visit (INDEPENDENT_AMBULATORY_CARE_PROVIDER_SITE_OTHER): Payer: Medicare Other

## 2014-05-01 VITALS — HR 76 | Temp 97.7°F | Wt 192.2 lb

## 2014-05-01 DIAGNOSIS — R252 Cramp and spasm: Secondary | ICD-10-CM

## 2014-05-01 LAB — CK: CK TOTAL: 109 U/L (ref 7–177)

## 2014-05-01 LAB — BASIC METABOLIC PANEL
BUN: 25 mg/dL — AB (ref 6–23)
CO2: 29 mEq/L (ref 19–32)
Calcium: 9.1 mg/dL (ref 8.4–10.5)
Chloride: 102 mEq/L (ref 96–112)
Creatinine, Ser: 1.1 mg/dL (ref 0.4–1.2)
GFR: 50.89 mL/min — AB (ref >60.00)
Glucose, Bld: 97 mg/dL (ref 70–99)
POTASSIUM: 5.3 meq/L — AB (ref 3.5–5.1)
Sodium: 139 mEq/L (ref 135–145)

## 2014-05-01 LAB — MAGNESIUM: Magnesium: 1.9 mg/dL (ref 1.5–2.5)

## 2014-05-01 MED ORDER — ROPINIROLE HCL 0.25 MG PO TABS: 0.2500 mg | ORAL_TABLET | Freq: Every day | ORAL | Status: AC

## 2014-05-01 NOTE — Progress Notes (Signed)
   Subjective:    Patient ID: Mary Roth, female    DOB: 1929-04-11, 80 y.o.   MRN: [id number]  HPI  Since she fell last Summer she's had increasing symptoms of restless leg syndrome manifested by cramps in her feet and calves @ night. She states it feels as if her calves are " turning".  This is despite doing isometric exercises in the evening.  She is on spironolactone.  Review of Systems    She denies any swelling or redness in the feet or calves.  There is no change in temperature over these areas.  She has some sweats but no fever, chills, weight loss  She denies a numbness, tingling, weakness in the legs  There is no loss of control of bowels or urine.     Objective:   Physical Exam  Positive or pertinent findings include: Heart sounds are distant. She has classic DIP osteoarthritic joint changes. Pedal pulses are decreased, especially the posterior tibial pulses.  General appearance :adequately nourished; in no distress. Eyes: No conjunctival inflammation or scleral icterus is present. Heart:  Normal rate and regular rhythm. S1 and S2 normal without gallop, murmur, click, rub or other extra sounds   Lungs:Chest clear to auscultation; no wheezes, rhonchi,rales ,or rubs present.No increased work of breathing.  Abdomen: bowel sounds normal, soft and non-tender without masses, organomegaly or hernias noted.  No guarding or rebound.  Vascular : all pulses equal ; no bruits present. Skin:Warm & dry.  Intact without suspicious lesions or rashes ; no jaundice or tenting Lymphatic: No lymphadenopathy is noted about the head, neck, axilla          Assessment & Plan:  #1 RLS Plan :  BMET Mg+ CK Isometrics pre bedtime Ropinirole trial & titration

## 2014-05-01 NOTE — Progress Notes (Signed)
Pre visit review using our clinic review tool, if applicable. No additional management support is needed unless otherwise documented below in the visit note. 

## 2014-05-01 NOTE — Patient Instructions (Signed)
Please perform isometric exercises before going to bed. Sit on side of the bed and raise up on toes to a count of 5. Then put pressure on the heels to a count of 5. Repeat this process 10 times. This will improve blood flow to the calves & help prevent cramps.

## 2014-05-16 ENCOUNTER — Ambulatory Visit (INDEPENDENT_AMBULATORY_CARE_PROVIDER_SITE_OTHER): Payer: Medicare Other | Admitting: Internal Medicine

## 2014-05-16 ENCOUNTER — Encounter: Payer: Self-pay | Admitting: Internal Medicine

## 2014-05-16 VITALS — BP 140/70 | HR 66 | Temp 98.0°F | Resp 17 | Ht 59.0 in | Wt 192.5 lb

## 2014-05-16 DIAGNOSIS — G2581 Restless legs syndrome: Secondary | ICD-10-CM

## 2014-05-16 DIAGNOSIS — E039 Hypothyroidism, unspecified: Secondary | ICD-10-CM | POA: Diagnosis not present

## 2014-05-16 DIAGNOSIS — E785 Hyperlipidemia, unspecified: Secondary | ICD-10-CM

## 2014-05-16 DIAGNOSIS — K219 Gastro-esophageal reflux disease without esophagitis: Secondary | ICD-10-CM

## 2014-05-16 DIAGNOSIS — I10 Essential (primary) hypertension: Secondary | ICD-10-CM | POA: Diagnosis not present

## 2014-05-16 DIAGNOSIS — Z0000 Encounter for general adult medical examination without abnormal findings: Secondary | ICD-10-CM

## 2014-05-16 NOTE — Assessment & Plan Note (Signed)
As per Dr Claiborne Billings

## 2014-05-16 NOTE — Progress Notes (Signed)
Subjective:    Patient ID: Mary Roth, female    DOB: [birth year], 81 y.o.   MRN: [id number]  HPI UHC/Medicare Wellness Visit: Psychosocial and medical history were reviewed as required by Medicare (history related to abuse, antisocial behavior , firearm risk). Social history: Caffeine: none Alcohol:  > 3 drinks/ week Tobacco use: never  Exercise:no Personal safety/fall risk:no Limitations of activities of daily living:no Seatbelt/ smoke alarm use:yes Healthcare Power of Attorney/Living Will status and End of Life process assessment : UTD Ophthalmologic exam status:due Hearing evaluation status:not UTD Orientation: Oriented X 3 Memory and recall: good Spelling  testing: good Depression/anxiety assessment: no Foreign travel history:2014 [place] Immunization status for influenza/pneumonia/ shingles /tetanus:Prevnar needed Transfusion history:no Preventive health care maintenance status: Colonoscopy/BMD/mammogram/Pap as per protocol/standard care: UTD Dental care: every 24 mos Chart reviewed and updated. Active issues reviewed and addressed as documented below.   The Norvasc & Bystolic prescribed by Dr Claiborne Billings, Cardiology,were thought to have caused a rash; but this did not appear to be the case  She has been compliant with her statin, thyroid, and PPI taken for active health issues on record.  She has no GI symptoms on PPI. If she does not take it she has dysphagia.     Review of Systems No significant change in weight; significant fatigue; sleep disorder; change in appetite. No blurred, double vision ,loss of vision No palpitations; racing; irregularity No constipation; diarrhea;hoarseness; No change in skin. Nails brittle; some hair loss. No numbness or tingling; tremor No anxiety; depression; panic attacks No temperature intolerance to cold. Hot flashes.  Unexplained weight loss, abdominal pain, significant dyspepsia, dysphagia, melena, rectal bleeding, or  persistently small caliber stools are denied.  Leg cramps every night; Requip helps for 2-3 hours Clonazepam did not help.     Objective:   Physical Exam  Gen.: Adequately nourished in appearance. As per CDC Guidelines ,Epic documents obesity as being present .  Alert, appropriate and cooperative throughout exam. Appears younger than stated age  Head: Normocephalic without obvious abnormalities;no alopecia  Eyes: No corneal or conjunctival inflammation noted. Pupils equal round reactive to light and accommodation. Extraocular motion intact.  Ears: External  ear exam reveals no significant lesions or deformities. Canals clear .TMs normal. Hearing is grossly normal bilaterally. Nose: External nasal exam reveals no deformity or inflammation. Nasal mucosa are pink and moist. No lesions or exudates noted.   Mouth: Oral mucosa and oropharynx reveal no lesions or exudates. Teeth in good repair. Neck: No deformities, masses, or tenderness noted. Range of motion & Thyroid normal. Lungs: Normal respiratory effort; chest expands symmetrically. Lungs are clear to auscultation without rales, wheezes, or increased work of breathing. Heart: Normal rate and rhythm. Normal S1 and S2. No gallop, click, or rub. No murmur. Abdomen: Protuberant.Bowel sounds normal; abdomen soft and nontender. No masses, organomegaly or hernias noted. Genitalia: as per Gyn                                  Musculoskeletal/extremities: Accentuated curvature of upper thoracic spine. No clubbing, cyanosis, edema, or significant extremity  deformity noted.  Range of motion decreased @ knees. Tone & strength normal. Hand joints normal.  Fingernail  health good. Crepitus of knees  Able to lie down & sit up w/o help.  Negative SLR bilaterally Vascular: Carotid, radial artery, dorsalis pedis and  posterior tibial pulses are equal. Decreased PTP.No bruits present. Neurologic: Alert and oriented  x3. Deep tendon reflexes symmetrical  ;1/2+ @ knees  Gait normal . Lymph: No cervical, axillary lymphadenopathy present. Psych: Mood and affect are normal. Normally interactive                                                                                      Assessment & Plan:  See Current Assessment & Plan in Problem List under specific DiagnosisThe labs will be reviewed and risks and options assessed. Written recommendations will be provided by mail or directly through My Chart.Further evaluation or change in medical therapy will be directed by those results.

## 2014-05-16 NOTE — Assessment & Plan Note (Signed)
Lipids, LFTs 

## 2014-05-16 NOTE — Progress Notes (Signed)
Pre visit review using our clinic review tool, if applicable. No additional management support is needed unless otherwise documented below in the visit note. 

## 2014-05-16 NOTE — Assessment & Plan Note (Signed)
TSH 

## 2014-05-16 NOTE — Patient Instructions (Addendum)
Your next office appointment will be determined based upon review of your pending labs . Those instructions will be transmitted to you by mail  Requip 2.5 mg TWO before bed time as trial.   Reflux of gastric acid may be asymptomatic as this may occur mainly during sleep.The triggers for reflux  include stress; the "aspirin family" ; alcohol; peppermint; and caffeine (coffee, tea, cola, and chocolate). The aspirin family would include aspirin and the nonsteroidal agents such as ibuprofen &  Naproxen. Tylenol would not cause reflux. If having symptoms ; food & drink should be avoided for @ least 2 hours before going to bed.

## 2014-05-17 ENCOUNTER — Other Ambulatory Visit (INDEPENDENT_AMBULATORY_CARE_PROVIDER_SITE_OTHER): Payer: Medicare Other

## 2014-05-17 ENCOUNTER — Telehealth: Payer: Self-pay | Admitting: Internal Medicine

## 2014-05-17 DIAGNOSIS — E785 Hyperlipidemia, unspecified: Secondary | ICD-10-CM

## 2014-05-17 LAB — LIPID PANEL
CHOLESTEROL: 232 mg/dL — AB (ref 0–200)
HDL: 64.8 mg/dL (ref >39.00)
LDL Cholesterol: 141 mg/dL — ABNORMAL HIGH (ref 0–99)
NonHDL: 167.2
TRIGLYCERIDES: 133 mg/dL (ref 0.0–149.0)
Total CHOL/HDL Ratio: 4
VLDL: 26.6 mg/dL (ref 0.0–40.0)

## 2014-05-17 LAB — HEPATIC FUNCTION PANEL
ALT: 18 U/L (ref 0–35)
AST: 20 U/L (ref 0–37)
Albumin: 3.6 g/dL (ref 3.5–5.2)
Alkaline Phosphatase: 70 U/L (ref 39–117)
BILIRUBIN TOTAL: 0.7 mg/dL (ref 0.2–1.2)
Bilirubin, Direct: 0.1 mg/dL (ref 0.0–0.3)
Total Protein: 6.9 g/dL (ref 6.0–8.3)

## 2014-05-17 LAB — TSH: TSH: 0.61 u[IU]/mL (ref 0.35–4.50)

## 2014-05-17 NOTE — Telephone Encounter (Signed)
emmi emailed °

## 2014-05-17 NOTE — Assessment & Plan Note (Signed)
CBC current Anti reflux measures

## 2014-06-06 ENCOUNTER — Ambulatory Visit (INDEPENDENT_AMBULATORY_CARE_PROVIDER_SITE_OTHER): Payer: Medicare Other | Admitting: Women's Health

## 2014-06-06 ENCOUNTER — Encounter: Payer: Self-pay | Admitting: Women's Health

## 2014-06-06 VITALS — BP 126/80 | Ht 59.0 in | Wt 194.0 lb

## 2014-06-06 DIAGNOSIS — Z01419 Encounter for gynecological examination (general) (routine) without abnormal findings: Secondary | ICD-10-CM

## 2014-06-06 NOTE — Patient Instructions (Signed)

## 2014-06-06 NOTE — Progress Notes (Signed)
Mary Roth [birth year] [id number]    History:    Presents for annual exam.  Postmenopausal/no HRT/no bleeding. Normal Pap and mammogram history. 2013 T score -1.4 FRAX 20%/11% declines further evaluation or treatment. Hypertension/asthma/reflex primary care manages labs and meds. 2013 negative colonoscopy. Current on vaccines.  Past medical history, past surgical history, family history and social history were all reviewed and documented in the EPIC chart. Downsizing, has 2 vacation homes, planning to move to retirement community. 2 children both doing well.  ROS:  A ROS was performed and pertinent positives and negatives are included.  Exam:  Filed Vitals:   06/06/14 1359  BP: 126/80    General appearance:  Normal Thyroid:  Symmetrical, normal in size, without palpable masses or nodularity. Respiratory  Auscultation:  Clear without wheezing or rhonchi Cardiovascular  Auscultation:  Regular rate, without rubs, murmurs or gallops  Edema/varicosities:  Not grossly evident Abdominal  Soft,nontender, without masses, guarding or rebound.  Liver/spleen:  No organomegaly noted  Hernia:  None appreciated  Skin  Inspection:  Grossly normal   Breasts: Examined lying and sitting.     Right: Without masses, retractions, discharge or axillary adenopathy.     Left: Without masses, retractions, discharge or axillary adenopathy. Gentitourinary   Inguinal/mons:  Normal without inguinal adenopathy  External genitalia:  Mild erythema, stress incontinence  BUS/Urethra/Skene's glands:  Normal  Vagina:  Atrophic Cervix:  Normal  Uterus:   normal in size, shape and contour.  Midline and mobile  Adnexa/parametria:     Rt: Without masses or tenderness.   Lt: Without masses or tenderness.  Anus and perineum: Normal  Digital rectal exam: Normal sphincter tone without palpated masses or tenderness  Assessment/Plan:  81 y.o. MWF G2P2 for annual exam.    Vaginal atrophy/not sexually  active Osteopenia with elevated FRAX declines treatment Hypertension/asthma/reflux-primary care manages labs and meds Obesity Postmenopausal/no bleeding/no HRT  Plan: SBE's, continue annual screening mammogram, calcium rich diet, home safety, fall prevention and importance of regular exercise reviewed. Vitamin D 2000 daily encouraged. Pap screening guidelines reviewed.    [place], 5:46 PM 06/06/2014

## 2014-06-07 LAB — URINALYSIS W MICROSCOPIC + REFLEX CULTURE
BACTERIA UA: NONE SEEN
CASTS: NONE SEEN
CRYSTALS: NONE SEEN
GLUCOSE, UA: NEGATIVE mg/dL
Hgb urine dipstick: NEGATIVE
Ketones, ur: NEGATIVE mg/dL
LEUKOCYTES UA: NEGATIVE
NITRITE: NEGATIVE
Protein, ur: NEGATIVE mg/dL
SQUAMOUS EPITHELIAL / LPF: NONE SEEN
Specific Gravity, Urine: 1.03 (ref 1.005–1.030)
Urobilinogen, UA: 1 mg/dL (ref 0.0–1.0)
pH: 5.5 (ref 5.0–8.0)

## 2014-06-13 ENCOUNTER — Other Ambulatory Visit: Payer: Self-pay | Admitting: Cardiovascular Disease

## 2014-06-14 NOTE — Telephone Encounter (Signed)
Rx refill sent to patient pharmacy   

## 2014-06-23 DIAGNOSIS — M461 Sacroiliitis, not elsewhere classified: Secondary | ICD-10-CM | POA: Diagnosis not present

## 2014-06-23 DIAGNOSIS — M7062 Trochanteric bursitis, left hip: Secondary | ICD-10-CM | POA: Diagnosis not present

## 2014-07-03 ENCOUNTER — Other Ambulatory Visit: Payer: Self-pay | Admitting: Cardiovascular Disease

## 2014-07-03 NOTE — Telephone Encounter (Signed)
Rx(s) sent to pharmacy electronically.  

## 2014-08-02 ENCOUNTER — Other Ambulatory Visit: Payer: Self-pay | Admitting: Women's Health

## 2014-08-02 ENCOUNTER — Ambulatory Visit (INDEPENDENT_AMBULATORY_CARE_PROVIDER_SITE_OTHER): Payer: Medicare Other | Admitting: Women's Health

## 2014-08-02 ENCOUNTER — Ambulatory Visit (INDEPENDENT_AMBULATORY_CARE_PROVIDER_SITE_OTHER): Payer: Medicare Other

## 2014-08-02 ENCOUNTER — Encounter: Payer: Self-pay | Admitting: Women's Health

## 2014-08-02 VITALS — BP 130/80 | Ht 59.0 in | Wt 190.0 lb

## 2014-08-02 DIAGNOSIS — R938 Abnormal findings on diagnostic imaging of other specified body structures: Secondary | ICD-10-CM | POA: Diagnosis not present

## 2014-08-02 DIAGNOSIS — R1909 Other intra-abdominal and pelvic swelling, mass and lump: Secondary | ICD-10-CM | POA: Diagnosis not present

## 2014-08-02 DIAGNOSIS — N83201 Unspecified ovarian cyst, right side: Secondary | ICD-10-CM

## 2014-08-02 DIAGNOSIS — R1032 Left lower quadrant pain: Secondary | ICD-10-CM

## 2014-08-02 DIAGNOSIS — N8320 Unspecified ovarian cysts: Secondary | ICD-10-CM | POA: Diagnosis not present

## 2014-08-02 DIAGNOSIS — R9389 Abnormal findings on diagnostic imaging of other specified body structures: Secondary | ICD-10-CM

## 2014-08-02 NOTE — Patient Instructions (Signed)

## 2014-08-02 NOTE — Progress Notes (Signed)
Patient ID: Mary Roth, female   DOB: 1928-10-04, 81 y.o.   MRN: [id number] Presents with left lower quadrant pain started 3 days ago was intense/severe has gotten slightly better. Questions if it is her ovary. Denies nausea, vomiting, diarrhea or constipation. No urinary symptoms, vaginal discharge. Upset  husband diagnosed with lung cancer today prior to appointment. Postmenopausal/no HRT/no bleeding. Has had left lower quadrant pain in the past, CT of the abdomen 10/2013 showed multiple diverticuli. Last GYN ultrasound 2009 showed small fibroid.  Exam: Appears uncomfortable, tearful when discussing husbands health. Ultrasound: Transvaginal anteverted uterus with heterogeneous echo. Prominent mixed echo endometrium focus 12 x 8 mm avascular. Endometrium 5.3 mm. Right ovarian cyst 8 x 7 mm internal low level echoes, previous calcification  not seen. Left ovary atrophic, seen today. Negative cul-de-sac. No apparent mass seen in left adnexal.  Left lower quadrant pain Possible diverticulitis  Plan: Ca 125, reviewed most likely pain is not from rt ovarian small cyst. Instructed to follow-up with GI doctor, bland diet, go to the ER if nausea or vomiting.

## 2014-08-03 ENCOUNTER — Telehealth: Payer: Self-pay | Admitting: Gastroenterology

## 2014-08-03 LAB — CA 125: CA 125: 19 U/mL (ref <35)

## 2014-08-03 MED ORDER — AMOXICILLIN-POT CLAVULANATE 875-125 MG PO TABS: 1.0000 | ORAL_TABLET | Freq: Two times a day (BID) | ORAL | Status: DC

## 2014-08-03 NOTE — Telephone Encounter (Signed)
Spoke with patient and she states she has a "knot" in her left side. She saw her GYN yesterday and was told it may be diverticulitis. She was not given any medications just told to f/u with GI. She continues to have pain to touch on left side. Denies vomiting or nausea. She is having chills. No extender visits today. Dr. Fuller Plan in [facility]. Please, advise.

## 2014-08-03 NOTE — Telephone Encounter (Signed)
She had abdominal pain on the left back in June 2015 and CT was negative for diverticulitis, but she does have diverticular disease.  With the chills and pain we can empirically treat her with Augmentin 875 mg BID for 7 days since the office is closed tomorrow for a long weekend (she is allergic to cipro).  Please bring her in for a follow-up visit next week and advise her to got to urgent care or ED if symptoms worse despite medication.  Thank you,  Jess

## 2014-08-03 NOTE — Telephone Encounter (Signed)
Spoke with patient and gave her recommendations. Rx sent to pharmacy. Patient to call Monday to schedule OV.

## 2014-08-08 ENCOUNTER — Telehealth: Payer: Self-pay | Admitting: Women's Health

## 2014-08-08 NOTE — Telephone Encounter (Signed)
Telephone call to review ultrasound findings and need for sonohysterogram recommendations by Dr. Phineas Real, will schedule sonohysterogram.

## 2014-08-11 ENCOUNTER — Other Ambulatory Visit: Payer: Self-pay | Admitting: Women's Health

## 2014-08-11 DIAGNOSIS — N840 Polyp of corpus uteri: Secondary | ICD-10-CM

## 2014-08-11 NOTE — Progress Notes (Signed)
Will call to schedule sonohysterogram with Dr. Phineas Real, husband recently diagnosed with lung cancer and needs to deal with that at this time.

## 2014-08-14 ENCOUNTER — Encounter: Payer: Self-pay | Admitting: Family Medicine

## 2014-08-14 ENCOUNTER — Other Ambulatory Visit (INDEPENDENT_AMBULATORY_CARE_PROVIDER_SITE_OTHER): Payer: Medicare Other

## 2014-08-14 ENCOUNTER — Ambulatory Visit (INDEPENDENT_AMBULATORY_CARE_PROVIDER_SITE_OTHER)
Admission: RE | Admit: 2014-08-14 | Discharge: 2014-08-14 | Disposition: A | Discharge status: Home / Self Care | Payer: Medicare Other | Source: Ambulatory Visit | Attending: Family Medicine | Admitting: Family Medicine

## 2014-08-14 ENCOUNTER — Ambulatory Visit (INDEPENDENT_AMBULATORY_CARE_PROVIDER_SITE_OTHER): Payer: Medicare Other | Admitting: Family Medicine

## 2014-08-14 VITALS — BP 116/82 | HR 76 | Wt 185.0 lb

## 2014-08-14 DIAGNOSIS — M75101 Unspecified rotator cuff tear or rupture of right shoulder, not specified as traumatic: Secondary | ICD-10-CM

## 2014-08-14 DIAGNOSIS — M25511 Pain in right shoulder: Secondary | ICD-10-CM

## 2014-08-14 DIAGNOSIS — M12811 Other specific arthropathies, not elsewhere classified, right shoulder: Secondary | ICD-10-CM | POA: Diagnosis not present

## 2014-08-14 DIAGNOSIS — M19011 Primary osteoarthritis, right shoulder: Secondary | ICD-10-CM | POA: Diagnosis not present

## 2014-08-14 NOTE — Progress Notes (Signed)
[facility] [address] [place], [place] [address] Phone: [phone] Subjective:    I'm seeing this patient by the request  of:  Unice Cobble, MD   CC: right shoulder pain  URK:[id number] Mary Roth is a 81 y.o. female coming in with complaint of right shoulder pain. Patient was carrying a jug of tea last night and felt a severe pain in the right shoulder. Patient states over the course of the night it seemed to get worse. Patient had difficulty actually moving the arm. Patient states that it was very uncomfortable sleeping. Denies any pain prior to this was hurting her shoulder. Patient describes it now as a dull throbbing aching pain at all times with a sharp severe pain with movement. Patient denies any radiation down the arm or any associated neck pain. Patient states though that she is unable to move the arm on its own. Patient rates the severity the pain as 9 out of 10. Has tried some Tylenol with very minimal relief.     Past medical history, social, surgical and family history all reviewed in electronic medical record.   Review of Systems: No headache, visual changes, nausea, vomiting, diarrhea, constipation, dizziness, abdominal pain, skin rash, fevers, chills, night sweats, weight loss, swollen lymph nodes, body aches, joint swelling, muscle aches, chest pain, shortness of breath, mood changes.   Objective Blood pressure 116/82, pulse 76, weight 185 lb (83.915 kg), SpO2 95 %.  General: No apparent distress alert and oriented x3 mood and affect normal, dressed appropriately.  HEENT: Pupils equal, extraocular movements intact  Respiratory: Patient's speak in full sentences and does not appear short of breath  Cardiovascular: No lower extremity edema, non tender, no erythema  Skin: Warm dry intact with no signs of infection or rash on extremities or on axial skeleton.  Abdomen: Soft nontender  Neuro: Cranial nerves II through XII are intact,  neurovascularly intact in all extremities with 2+ DTRs and 2+ pulses.  Lymph: No lymphadenopathy of posterior or anterior cervical chain or axillae bilaterally.  Gait normal with good balance and coordination.  MSK:  Non tender with full range of motion and good stability and symmetric strength and tone of  elbows, wrist, hip, knee and ankles bilaterally.  Shoulder: Right Inspection reveals no abnormalities, atrophy or asymmetry. Palpation has some diffuse tenderness Unable to do range of motion testing second due to pain Rotator cuff strength 2 out of 5 compared to 4+ out of 5 on the contralateral side. signs of impingement with positive  Speeds and Yergason's tests normal.  painful arc and positivedrop arm sign. Contralateral shoulder unremarkable.  MSK US performed of: Right This study was ordered, performed, and interpreted by Charlann Boxer D.O.  Shoulder:   Supraspinatus:  Patient does have intersubstance tearing but no significant retraction. Hypoechoic changes noted. Infraspinatus:  Severe full-thickness tear noted of the interspinous with 1.56 cm of retraction Subscapularis:  Full-thickness tear noted with 0.475 cm of retraction  AC joint: severe arthritis Glenohumeral Joint:  Moderate to severe arthritis Glenoid Labrum:  Intact without visualized tears. Biceps Tendon:  Significant hypoechoic changes but no true tear appreciated.  Impression: rotator cuff tear,  With underlying arthritis  Procedure: Real-time Ultrasound Guided Injection of right glenohumeral joint Device: GE Logiq E  Ultrasound guided injection is preferred based studies that show increased duration, increased effect, greater accuracy, decreased procedural pain, increased response rate with ultrasound guided versus blind injection.  Verbal informed consent obtained.  Time-out  conducted.  Noted no overlying erythema, induration, or other signs of local infection.  Skin prepped in a sterile fashion.  Local  anesthesia: Topical Ethyl chloride.  With sterile technique and under real time ultrasound guidance:  Joint visualized.  23g 1  inch needle inserted posterior approach. Pictures taken for needle placement. Patient did have injection of 2 cc of 1% lidocaine, 2 cc of 0.5% Marcaine, and 1.0 cc of Kenalog 40 mg/dL. Completed without difficulty  Pain immediately resolved suggesting accurate placement of the medication.  Advised to call if fevers/chills, erythema, induration, drainage, or persistent bleeding.  Images permanently stored and available for review in the ultrasound unit.  Impression: Technically successful ultrasound guided injection.     Impression and Recommendations:     This case required medical decision making of moderate complexity.

## 2014-08-14 NOTE — Progress Notes (Signed)
Pre visit review using our clinic review tool, if applicable. No additional management support is needed unless otherwise documented below in the visit note. 

## 2014-08-14 NOTE — Patient Instructions (Signed)
Good to see you.  Ice 20 minutes 2 times daily. Usually after activity and before bed. Take next week easy Take tylenol 650 mg three times a day is the best evidence based medicine we have for arthritis.  Vitamin D 2000 IU daily Fish oil 2 grams daily.  Tumeric 500mg  twice daily.  Capsaicin topically up to four times a day may also help with pain. Physical therapy will be calling you Come back and see me in 3 weeks.

## 2014-08-14 NOTE — Assessment & Plan Note (Signed)
Patient does have a full-thickness tear of the rotator cuff. Discussed different treatment options with patient and patient is elected to try conservative therapy even with the retraction noted. Patient is 81 years old and does not think she would 1 surgical intervention. Patient will be started in formal physical therapy and was given an injection today. X-rays ordered today. We discussed topical anti-inflammatories as well as icing protocol. Patient will avoid any significant activities until she starts with physical therapy. Discussed with her that this will be a long process. Patient will follow-up in 3 weeks for further evaluation and treatment.

## 2014-08-21 ENCOUNTER — Ambulatory Visit: Payer: Medicare Other | Admitting: Physical Therapy

## 2014-09-04 ENCOUNTER — Ambulatory Visit: Payer: Medicare Other | Admitting: Family Medicine

## 2014-09-11 ENCOUNTER — Other Ambulatory Visit: Payer: Self-pay | Admitting: Cardiovascular Disease

## 2014-09-11 NOTE — Telephone Encounter (Signed)
Rx(s) sent to pharmacy electronically.  

## 2014-09-18 ENCOUNTER — Other Ambulatory Visit: Payer: Self-pay | Admitting: Cardiovascular Disease

## 2014-10-12 ENCOUNTER — Other Ambulatory Visit: Payer: Self-pay | Admitting: Internal Medicine

## 2014-10-12 ENCOUNTER — Other Ambulatory Visit: Payer: Self-pay | Admitting: Cardiovascular Disease

## 2014-10-19 ENCOUNTER — Other Ambulatory Visit: Payer: Self-pay | Admitting: Cardiovascular Disease

## 2014-10-26 ENCOUNTER — Other Ambulatory Visit: Payer: Self-pay | Admitting: Cardiovascular Disease

## 2014-10-26 NOTE — Telephone Encounter (Signed)
Rx(s) sent to pharmacy electronically.  

## 2014-10-30 ENCOUNTER — Other Ambulatory Visit: Payer: Self-pay | Admitting: Internal Medicine

## 2014-10-30 NOTE — Telephone Encounter (Signed)
OK X 6 mos

## 2014-10-30 NOTE — Telephone Encounter (Signed)
Last office visit April/2015---please advise, thanks

## 2014-10-31 ENCOUNTER — Ambulatory Visit (INDEPENDENT_AMBULATORY_CARE_PROVIDER_SITE_OTHER): Payer: Medicare Other | Admitting: *Deleted

## 2014-10-31 DIAGNOSIS — Z23 Encounter for immunization: Secondary | ICD-10-CM

## 2014-11-02 LAB — TB SKIN TEST
INDURATION: 0 mm
TB SKIN TEST: NEGATIVE

## 2014-11-07 ENCOUNTER — Other Ambulatory Visit: Payer: Self-pay | Admitting: Cardiovascular Disease

## 2014-11-07 NOTE — Telephone Encounter (Signed)
REFILL 

## 2014-11-14 ENCOUNTER — Other Ambulatory Visit: Payer: Self-pay | Admitting: Internal Medicine

## 2014-11-14 ENCOUNTER — Other Ambulatory Visit: Payer: Self-pay | Admitting: Cardiovascular Disease

## 2014-11-14 NOTE — Telephone Encounter (Signed)
Rx(s) sent to pharmacy electronically.  

## 2014-11-20 ENCOUNTER — Encounter: Payer: Self-pay | Admitting: Gastroenterology

## 2014-11-21 ENCOUNTER — Ambulatory Visit (INDEPENDENT_AMBULATORY_CARE_PROVIDER_SITE_OTHER): Payer: Medicare Other | Admitting: Internal Medicine

## 2014-11-21 ENCOUNTER — Encounter: Payer: Self-pay | Admitting: Internal Medicine

## 2014-11-21 ENCOUNTER — Other Ambulatory Visit (INDEPENDENT_AMBULATORY_CARE_PROVIDER_SITE_OTHER): Payer: Medicare Other

## 2014-11-21 VITALS — BP 124/74 | HR 79 | Temp 97.9°F | Resp 18 | Wt 187.0 lb

## 2014-11-21 DIAGNOSIS — I10 Essential (primary) hypertension: Secondary | ICD-10-CM

## 2014-11-21 DIAGNOSIS — E039 Hypothyroidism, unspecified: Secondary | ICD-10-CM

## 2014-11-21 DIAGNOSIS — R609 Edema, unspecified: Secondary | ICD-10-CM

## 2014-11-21 LAB — HEPATIC FUNCTION PANEL
ALT: 12 U/L (ref 0–35)
AST: 17 U/L (ref 0–37)
Albumin: 4 g/dL (ref 3.5–5.2)
Alkaline Phosphatase: 70 U/L (ref 39–117)
Bilirubin, Direct: 0 mg/dL (ref 0.0–0.3)
Total Bilirubin: 0.4 mg/dL (ref 0.2–1.2)
Total Protein: 7.1 g/dL (ref 6.0–8.3)

## 2014-11-21 LAB — BASIC METABOLIC PANEL
BUN: 11 mg/dL (ref 6–23)
CHLORIDE: 103 meq/L (ref 96–112)
CO2: 28 mEq/L (ref 19–32)
CREATININE: 0.88 mg/dL (ref 0.40–1.20)
Calcium: 9.6 mg/dL (ref 8.4–10.5)
GFR: 65.06 mL/min (ref >60.00)
Glucose, Bld: 81 mg/dL (ref 70–99)
POTASSIUM: 4.1 meq/L (ref 3.5–5.1)
Sodium: 141 mEq/L (ref 135–145)

## 2014-11-21 LAB — TSH: TSH: 0.89 u[IU]/mL (ref 0.35–4.50)

## 2014-11-21 NOTE — Progress Notes (Signed)
Pre visit review using our clinic review tool, if applicable. No additional management support is needed unless otherwise documented below in the visit note. 

## 2014-11-21 NOTE — Progress Notes (Signed)
   Subjective:    Patient ID: Mary Roth, female    DOB: Apr 11, 1929, 81 y.o.   MRN: [id number]  HPI  Edema of lower extremities has been present for 3 weeks. This occurred in the context of a six-hour drive to the [facility] [place]. They've been back 2 weeks but edema has not resolved. There was no other triggers such as increased salt intake. She has no active cardiopulmonary symptoms. She is not checking her blood pressure. She is on low-dose amlodipine.  Albumen, liver function test and TSH were normal in January this year. In December her creatinine was 1.1.   Review of Systems  Chest pain, palpitations, tachycardia, exertional dyspnea, paroxysmal nocturnal dyspnea, or claudication are absent. No unexplained weight loss, abdominal pain, significant dyspepsia, dysphagia, melena, rectal bleeding, or persistently small caliber stools. Dysuria, pyuria, hematuria, frequency, nocturia or polyuria are denied. Change in hair, skin, nails denied. No bowel changes of constipation or diarrhea. No intolerance to heat or cold; but she does have sweats.      Objective:   Physical Exam Pertinent or positive findings include:  BMI: 37.75. No neck vein distention at 10. No hepatojugular reflux. One half-1+ edema at the ankles. There is some component of lipedema as well. She has decreased posterior tibial pulses  General appearance :adequately nourished; in no distress.  Eyes: No conjunctival inflammation or scleral icterus is present.  Oral exam:  Lips and gums are healthy appearing.There is no oropharyngeal erythema or exudate noted. Dental hygiene is good.  Heart:  Normal rate and regular rhythm. S1 and S2 normal without gallop, murmur, click, rub or other extra sounds    Lungs:Chest clear to auscultation; no wheezes, rhonchi,rales ,or rubs present.No increased work of breathing.   Abdomen: bowel sounds normal, soft and non-tender without masses, organomegaly or hernias noted.  No guarding or  rebound.   Vascular : all pulses equal ; no bruits present.  Skin:Warm & dry.  Intact without suspicious lesions or rashes ; no tenting or jaundice   Lymphatic: No lymphadenopathy is noted about the head, neck, axilla.   Neuro: Strength, tone & DTRs normal.        Assessment & Plan:  #1 edema.  #2 HTN on low dose amlodipine therapy. Blood pressure appears to be well controlled.   #3 hypothyroidism with low TSH Plan: See orders and recommendations

## 2014-11-21 NOTE — Patient Instructions (Signed)
Minimal Blood Pressure Goal= AVERAGE < 140/90;  Ideal is an AVERAGE < 135/85. This AVERAGE should be calculated from @ least 5-7 BP readings taken @ different times of day on different days of week. You should not respond to isolated BP readings , but rather the AVERAGE for that week .Please bring your  blood pressure cuff to office visits to verify that it is reliable.It  can also be checked against the blood pressure device at the pharmacy. Finger or wrist cuffs are not dependable; an arm cuff is. Your next office appointment will be determined based upon review of your pending labs. Those written interpretation of the lab results and instructions will be transmitted to you by mail for your records.  Critical results will be called.   Followup as needed for any active or acute issue. Please report any significant change in your symptoms. 

## 2014-11-28 ENCOUNTER — Other Ambulatory Visit: Payer: Self-pay | Admitting: Cardiovascular Disease

## 2014-11-28 NOTE — Telephone Encounter (Signed)
Rx(s) sent to pharmacy electronically. OV 10/4 with Dr. Claiborne Billings

## 2014-12-11 ENCOUNTER — Other Ambulatory Visit: Payer: Self-pay | Admitting: Internal Medicine

## 2014-12-14 ENCOUNTER — Other Ambulatory Visit: Payer: Self-pay | Admitting: Emergency Medicine

## 2014-12-14 MED ORDER — LEVOTHYROXINE SODIUM 88 MCG PO TABS: 88.0000 ug | ORAL_TABLET | Freq: Every day | ORAL | Status: DC

## 2014-12-30 ENCOUNTER — Other Ambulatory Visit: Payer: Self-pay | Admitting: Cardiovascular Disease

## 2015-01-01 NOTE — Telephone Encounter (Signed)
Rx(s) sent to pharmacy electronically.  

## 2015-01-31 ENCOUNTER — Ambulatory Visit (INDEPENDENT_AMBULATORY_CARE_PROVIDER_SITE_OTHER): Payer: Medicare Other | Admitting: Women's Health

## 2015-01-31 ENCOUNTER — Encounter: Payer: Self-pay | Admitting: Women's Health

## 2015-01-31 VITALS — BP 126/80 | Ht 59.0 in | Wt 187.0 lb

## 2015-01-31 DIAGNOSIS — A499 Bacterial infection, unspecified: Secondary | ICD-10-CM | POA: Diagnosis not present

## 2015-01-31 DIAGNOSIS — N760 Acute vaginitis: Secondary | ICD-10-CM

## 2015-01-31 DIAGNOSIS — N898 Other specified noninflammatory disorders of vagina: Secondary | ICD-10-CM | POA: Diagnosis not present

## 2015-01-31 DIAGNOSIS — R350 Frequency of micturition: Secondary | ICD-10-CM

## 2015-01-31 DIAGNOSIS — B9689 Other specified bacterial agents as the cause of diseases classified elsewhere: Secondary | ICD-10-CM

## 2015-01-31 LAB — URINALYSIS W MICROSCOPIC + REFLEX CULTURE
BILIRUBIN URINE: NEGATIVE
CASTS: NONE SEEN [LPF]
Crystals: NONE SEEN [HPF]
Glucose, UA: NEGATIVE
HGB URINE DIPSTICK: NEGATIVE
Ketones, ur: NEGATIVE
Nitrite: NEGATIVE
Protein, ur: NEGATIVE
RBC / HPF: NONE SEEN RBC/HPF (ref <=2)
Specific Gravity, Urine: 1.02 (ref 1.001–1.035)
YEAST: NONE SEEN [HPF]
pH: 6 (ref 5.0–8.0)

## 2015-01-31 LAB — WET PREP FOR TRICH, YEAST, CLUE
CLUE CELLS WET PREP: NONE SEEN
TRICH WET PREP: NONE SEEN
WBC WET PREP: NONE SEEN
YEAST WET PREP: NONE SEEN

## 2015-01-31 MED ORDER — NYSTATIN 100000 UNIT/GM EX CREA: 1.0000 "application " | TOPICAL_CREAM | Freq: Two times a day (BID) | CUTANEOUS | Status: DC

## 2015-01-31 NOTE — Progress Notes (Signed)
Patient ID: Mary Roth, female   DOB: [birth year], 81 y.o.   MRN: [id number] Presents with complaint of vaginal, perineal itching extending into the groin area, irritation and soreness at vaginal opening. Postmenopausal/no bleeding/not sexually active. Wears pads daily, leakage of urine. Denies pain, burning, change in urine elimination has had problems with leakage for years, pads damp, not saturated. Moved last week to a senior living area, family members helping.  Exam: Appears well. External genitalia erythemic at groin extending to perineum.Introitus extremely erythematous without lesion, wet prep with Q-tip - negative. UA: Trace leukocytes, 0-5 WBCs, few bacteria, 6-10 squamous epithelials.  Vaginal irritation with itching Stress/urge incontinence  Plan: Nystatin cream apply externally twice daily, encouraged to keep open to air if able, change pads frequently, A and D ointment externally. Call if continued problems. Urine culture pending.

## 2015-02-05 ENCOUNTER — Telehealth: Payer: Self-pay | Admitting: *Deleted

## 2015-02-05 LAB — URINE CULTURE

## 2015-02-05 NOTE — Telephone Encounter (Signed)
Mary Roth form [facility] lab called stating there was an error on pt urine culture (01/31/15) it was noted "ESCHERICHIA COLI" and should have been Enterococcus. She faxed a copy and it will be updated in epic as well, I will place paper copy on your desk for you when you return.

## 2015-02-13 ENCOUNTER — Ambulatory Visit (INDEPENDENT_AMBULATORY_CARE_PROVIDER_SITE_OTHER): Payer: Medicare Other | Admitting: Cardiovascular Disease

## 2015-02-13 ENCOUNTER — Encounter: Payer: Self-pay | Admitting: Cardiovascular Disease

## 2015-02-13 VITALS — BP 142/92 | Ht 59.0 in | Wt 183.0 lb

## 2015-02-13 DIAGNOSIS — E039 Hypothyroidism, unspecified: Secondary | ICD-10-CM | POA: Diagnosis not present

## 2015-02-13 DIAGNOSIS — J4520 Mild intermittent asthma, uncomplicated: Secondary | ICD-10-CM | POA: Diagnosis not present

## 2015-02-13 DIAGNOSIS — R002 Palpitations: Secondary | ICD-10-CM | POA: Insufficient documentation

## 2015-02-13 DIAGNOSIS — E785 Hyperlipidemia, unspecified: Secondary | ICD-10-CM

## 2015-02-13 DIAGNOSIS — I10 Essential (primary) hypertension: Secondary | ICD-10-CM | POA: Diagnosis not present

## 2015-02-13 MED ORDER — NEBIVOLOL HCL 5 MG PO TABS: 5.0000 mg | ORAL_TABLET | Freq: Every day | ORAL | Status: DC

## 2015-02-13 NOTE — Progress Notes (Signed)
Patient ID: Mary Roth, female   DOB: Aug 21, 1928, 81 y.o.   MRN: [id number]    HPI:  Mary Roth is an 81 year old female who presents for an 19 month cardiology followup evaluation.   Mary Roth has a long history of asthma and has been under the care of Dr. Ignacia Palma. She takes  Advair PRN as well as albuterol  and also takes Flonase for rhinitis. She is a former patient of Dr. Melvern Banker. An echo Doppler study in 2012 showed concentric left ventricular hypertrophy with normal systolic function and grade 1 diastolic dysfunction. She had mild mitral annular calcification and evidence for aortic valve sclerosis Over the past several months, she had noticed increasing episodes of shortness of breath and also intermittent episodes of chest discomfort. She has noticed recent episodes of shortness of breath with walking. Approximately one month [place] experienced chest pressure associated with significant shortness of breath. She has noticed left arm radiation and also took a nitroglycerin which did seem to improve symptoms but this took approximately 20 minutes.   Additional problems include hyperlipidemia, hypothyroidism, and intermittent leg swelling. She was told of having rheumatic fever while in 6 grade.  She underwent an echo Doppler study which was done 06/20/2013 which showed normal systolic function with an ejection fraction of 55-60% but with grade 1 diastolic dysfunction. She did not have significant valvular pathology and her chamber dimensions were normal. However, she did have left ventricular hypertrophy. A nuclear perfusion study was done on 06/23/2013 and this was felt to be normal without evidence for scar or ischemia. Post stress ejection fraction was 70%.  Mary Roth continued to experience significant shortness of breath with activity and at times she feels this has progressed to the point where she is unable to breathe. Her echo Doppler  study showed normal right ventricular pressure at 11 mm and her peak RV to RA gradient was 8 mm. She denies any calf tenderness, there is no PND or orthopnea or palpitations.   When I saw last year, I empirically added low-dose amlodipine 2.5 mg as well as Bystolic 2.5 mg to her medical regimen. Due to her continued shortness of breath with activity I recommended a cardiopulmonary met that to evaluate for potential microvascular angina and endothelial dysfunction. This was done 04/03/2014. She had normal functional status and achieved a peak maximum oxygen consumption 92% of predicted. Her cardiovascular response to exercise was felt to be indeterminate due to suboptimal peak cardiovascular stress load. She had a blunted chronotropic response to exercise. Pulmonary response was normal. Exercise was limited by effort. PFT summary revealed spirometry, lung volumes, and diffusion were within normal limits. Her anaerobic threshold was low at 9.4.  Over the past year and a half, she has remained relatively stable.  Recently, she she has been under some increased stress in that her husband is undergoing chemotherapy for lung cancer.  They also recently moved from their house to retirement community.  She denies episodes of chest pain.  She is unaware of prolonged palpitations but at times has noticed rare skipped beat.  She has been having suboptimal sleep following her move. She still sees Dr. Linna Darner for primary care.  She presents for evaluation.  Past Medical History  Diagnosis Date  . Personal history of colonic polyps 08/21/2006    ADENOMATOUS POLYP  . Hepatic steatosis   . Abnormal liver function test   . Mastodynia   . Unspecified  essential hypertension   . Other and unspecified hyperlipidemia   . Esophageal reflux   . Unspecified asthma(493.90) 2003     no childhood asthma  . Edema   . Unspecified hypothyroidism   . Arthritis   . Osteopenia 03/2012    T score -1.4 FRAX 20%/11%; Dr Phineas Real     Past Surgical History  Procedure Laterality Date  . Appendectomy    . Cholecystectomy  1973     for stones  . Tonsillectomy    . Uterine suspension  1968    [place] , [place]    Allergies  Allergen Reactions  . Ciprofloxacin     ? Difficulty breathing  . Phenergan [Promethazine Hcl] Nausea And Vomiting    Current Outpatient Prescriptions  Medication Sig Dispense Refill  . ADVAIR DISKUS 250-50 MCG/DOSE AEPB USE 1 PUFF BY MOUTH EVERY12 HOURS GARGLE ANDSPIT AFTER EACH USE 60 each 1  . albuterol (PROVENTIL HFA;VENTOLIN HFA) 108 (90 BASE) MCG/ACT inhaler Inhale 2 puffs into the lungs every 6 (six) hours as needed for wheezing or shortness of breath. 1 Inhaler 2  . albuterol (PROVENTIL) (2.5 MG/3ML) 0.083% nebulizer solution Take 3 mLs (2.5 mg total) by nebulization every 4 (four) hours as needed for wheezing. 90 mL 3  . amLODipine (NORVASC) 2.5 MG tablet Take 1 tablet (2.5 mg total) by mouth daily. PLEASE KEEP UPCOMING APPOINTMENT 30 tablet 1  . beclomethasone (QVAR) 80 MCG/ACT inhaler Inhale 2 puffs into the lungs as needed.    . Calcium Carbonate-Vit D-Min (CALTRATE 600+D PLUS PO) Take by mouth daily.    . CRESTOR 5 MG tablet TAKE 1 TABLET BY MOUTH ONCE A DAY 30 tablet 11  . fexofenadine (ALLEGRA) 180 MG tablet Take 180 mg by mouth daily as needed (asthma).     . fluticasone (FLONASE) 50 MCG/ACT nasal spray Place 1 spray into the nose 2 (two) times daily as needed for rhinitis. 16 g 2  . gabapentin (NEURONTIN) 100 MG capsule Take 1 capsule by mouth daily.    [name] levothyroxine (SYNTHROID, LEVOTHROID) 88 MCG tablet Take 1 tablet (88 mcg total) by mouth daily. 90 tablet 1  . montelukast (SINGULAIR) 10 MG tablet Take 10 mg by mouth at bedtime.    [name] NEXIUM 40 MG capsule TAKE 1 CAPSULE BY MOUTH ONCE A DAY 30 capsule 5  . rOPINIRole (REQUIP) 0.25 MG tablet Take 1 tablet (0.25 mg total) by mouth at bedtime. 1 pill 2 hrs pre bedtime 30 tablet 2  . spironolactone (ALDACTONE) 25 MG tablet Take 1  tablet (25 mg total) by mouth 2 (two) times daily. 60 tablet 2  . nebivolol (BYSTOLIC) 5 MG tablet Take 1 tablet (5 mg total) by mouth daily. 30 tablet 11   No current facility-administered medications for this visit.    Socially she is married has 2 children one grandchild. There is no tobacco history. She does drink alcohol occasionally.  Family History  Problem Relation Age of Onset  . Uterine cancer Mother   . Ovarian cancer Mother   . Heart attack Father 29  . Prostate cancer Father   . Breast cancer Sister     x 3 sisters  . Asthma Sister   . Alcohol abuse Brother     x 3  . Prostate cancer Brother     x 3 brothers  . Pancreatic cancer Brother     x 2  . Diabetes Neg Hx   . Stroke Neg Hx    ROS General: Negative;  No fevers, chills, or night sweats;  HEENT: Negative; No changes in vision or hearing, sinus congestion, difficulty swallowing Pulmonary: Positive for asthma Cardiovascular: Negative; No chest pain, presyncope, syncope, palpitations GI: Positive for GERD GU: Negative; No dysuria, hematuria, or difficulty voiding Musculoskeletal: Negative; no myalgias, joint pain, or weakness Hematologic/Oncology: Negative; no easy bruising, bleeding Endocrine: Positive for hypothyroidism Neuro: Positive for peripheral neuropathy Skin: Negative; No rashes or skin lesions Psychiatric: Negative; No behavioral problems, depression Sleep: Negative; No snoring, daytime sleepiness, hypersomnolence, bruxism, restless legs, hypnogognic hallucinations, no cataplexy   PE BP 142/92 mmHg  Ht 4' 11"  (1.499 m)  Wt 183 lb (83.008 kg)  BMI 36.94 kg/m2   Wt Readings from Last 3 Encounters:  02/13/15 183 lb (83.008 kg)  01/31/15 187 lb (84.823 kg)  11/21/14 187 lb (84.823 kg)   General: Alert, oriented, no distress. Short stature with morbid obesity. Skin: Diffuse mild maculopapular rash on the torso, back, and arms  HEENT: Normocephalic, atraumatic. Pupils round and reactive;  sclera anicteric; extraocular muscles intact; Fundi  Nose without nasal septal hypertrophy Mouth/Parynx benign; Mallinpatti scale 2 Neck: No JVD, no carotid bruits; normal carotid upstroke Lungs: clear to ausculatation and percussion; no wheezing or rales Chest wall: without tenderness to palpitation Heart: RRR, s1 s2 normal 1/6 systolic murmur. No S3 or S4 gallop. No rub, thrill or heaves. Abdomen: soft, nontender; no hepatosplenomehaly, BS+; abdominal aorta nontender and not dilated by palpation. Back: no CVA tenderness Pulses 2+, Extremities: Trivial ankle edema; no clubbinbg cyanosis, Homan's sign negative  Neurologic: grossly nonfocal; Cranial nerves grossly wnl Psychologic: Normal mood and affect  ECG (independently read by me): Normal  inus rhythm at 80 bpm with one isolated PVC.  QTc interval 435 ms.  PR duration 192 ms  March 2015 Last ECG (independently read by me): Normal sinus rhythm at 64 beats per minute. Normal intervals. No evidence for either an S wave in I or Q in 3.  Prior ECG of 06/08/2013 (independently read by me): Sinus rhythm at 78 beats per minute. No significant ST changes. Normal intervals.  LABS:. BMP Latest Ref Rng 11/21/2014 05/01/2014 10/19/2013  Glucose 70 - 99 mg/dL 81 97 95  BUN 6 - 23 mg/dL 11 25(H) 17  Creatinine 0.40 - 1.20 mg/dL 0.88 1.1 0.9  Sodium 135 - 145 mEq/L 141 139 139  Potassium 3.5 - 5.1 mEq/L 4.1 5.3(H) 4.5  Chloride 96 - 112 mEq/L 103 102 101  CO2 19 - 32 mEq/L 28 29 30   Calcium 8.4 - 10.5 mg/dL 9.6 9.1 9.3   Hepatic Function Latest Ref Rng 11/21/2014 05/17/2014 05/09/2013  Total Protein 6.0 - 8.3 g/dL 7.1 6.9 6.6  Albumin 3.5 - 5.2 g/dL 4.0 3.6 3.7  AST 0 - 37 U/L 17 20 19   ALT 0 - 35 U/L 12 18 17   Alk Phosphatase 39 - 117 U/L 70 70 67  Total Bilirubin 0.2 - 1.2 mg/dL 0.4 0.7 0.5  Bilirubin, Direct 0.0 - 0.3 mg/dL 0.0 0.1 0.1   CBC Latest Ref Rng 10/19/2013 05/09/2013 09/01/2011  WBC 4.0 - 10.5 K/uL 11.9(H) 7.2 13.3(H)  Hemoglobin  12.0 - 15.0 g/dL 14.9 14.0 14.6  Hematocrit 36.0 - 46.0 % 44.1 42.1 44.5  Platelets 150.0 - 400.0 K/uL 222.0 198.0 246.0   Lab Results  Component Value Date   MCV 93.3 10/19/2013   MCV 93.4 05/09/2013   MCV 95.6 09/01/2011   Lab Results  Component Value Date   TSH 0.89 11/21/2014   Lab  Results  Component Value Date   HGBA1C 6.1 04/27/2012   Lipid Panel     Component Value Date/Time   CHOL 232* 05/17/2014 0834   TRIG 133.0 05/17/2014 0834   HDL 64.80 05/17/2014 0834   CHOLHDL 4 05/17/2014 0834   VLDL 26.6 05/17/2014 0834   LDLCALC 141* 05/17/2014 0834   LDLDIRECT 106.8 01/10/2011 1218      RADIOLOGY: No results found.   ASSESSMENT AND PLAN: Mary Roth is an 81 year old female who has  a long-standing history of asthma and in the past developed increasing shortness of breath. She has noticed several episodes of significant chest pressure and on one instance associated with left arm radiation. When I initially saw her I elected to add low-dose amlodipine at 2.5 mg in light of this chest pressure to induced vasodilation and also added a very low dose cardioselective beta-blockade with bystolic 2.5 mg and suggested that she initiate a baby aspirin 81 mg.  Prior testing did reveal a normal myocardial perfusion scan and echo Doppler study did show normal systolic function with grade 1 diastolic dysfunction. A cardiopulmonary met test showed fairly normal maximum oxygen consumption. However, the test was somewhat indeterminate due to to her inability to achieve a maximum heart rate. She was only to able to achieve a heart rate of 103 which was submaximal. Her heart rate response was somewhat blunted. I suspect much of her shortness of breath may be due to reduced aerobic capacity. Pulmonary function studies were within normal limits.  When I last saw her, I further titrated her Spiriva lactone to 25 mg twice a day.  This has improved her leg edema.  Her shortness of breath has improved.   Her blood pressure today is elevated and on repeat by me was 160/90 although she states at home typically is lower.  Her resting pulse is 90 bpm, and she did have a PVC and has noticed rare palpitations.  I'm electing to further titrate her Bystolic from 2.5 mg to 5 mg daily.  She will monitor her blood pressure.  If she does not note improvement, she will contact our office or see Dr. Linna Darner.  I reviewed laboratory which was done several months ago by Dr. Linna Darner.  I will see her in one year for cardiology evaluation or sooner if problems arise.  Troy Sine, MD, [facility] 02/13/2015 5:27 PM

## 2015-02-13 NOTE — Patient Instructions (Signed)
Your physician has recommended you make the following change in your medication: the Bystolic has been increased to 5 mg daily. A new prescription has been sent to your [facility] pharmacy to reflect this change.  Your physician wants you to follow-up in: 1 year or sooner if needed. You will receive a reminder letter in the mail two months in advance. If you don't receive a letter, please call our office to schedule the follow-up appointment.

## 2015-03-09 ENCOUNTER — Other Ambulatory Visit: Payer: Self-pay | Admitting: Cardiovascular Disease

## 2015-04-24 ENCOUNTER — Other Ambulatory Visit: Payer: Self-pay | Admitting: Cardiovascular Disease

## 2015-04-24 NOTE — Telephone Encounter (Signed)
Rx request sent to pharmacy.  

## 2015-05-25 ENCOUNTER — Telehealth: Payer: Self-pay

## 2015-05-25 NOTE — Telephone Encounter (Signed)
Patient called to educate on Medicare Wellness apt. LVM for the patient to call back to educate and schedule for wellness visit.  Call to spouse and checked and Mrs. Gagliardo has not had AWV; lvm

## 2015-05-31 NOTE — Telephone Encounter (Signed)
Call to spouse regarding AWV; they live in [place] and they are considering staying in [place]  Will call back if they change their mind and can schedule AWV.

## 2015-06-11 ENCOUNTER — Other Ambulatory Visit: Payer: Self-pay | Admitting: Internal Medicine

## 2015-06-11 ENCOUNTER — Telehealth: Payer: Self-pay | Admitting: Emergency Medicine

## 2015-06-11 NOTE — Telephone Encounter (Signed)
SPoke with pt. She plans to switch to the [place] office because it is closer. Instructed her to call and make an appt with new PCP and have them send in the refill request.

## 2015-06-19 ENCOUNTER — Telehealth: Payer: Self-pay | Admitting: *Deleted

## 2015-06-19 MED ORDER — LEVOTHYROXINE SODIUM 88 MCG PO TABS: 88.0000 ug | ORAL_TABLET | Freq: Every day | ORAL | Status: DC

## 2015-06-19 NOTE — Telephone Encounter (Signed)
Left msg on triage stating she is needing a refill on her Levothyroxine. Will make appt w/Dr. Quay Burow but is correctly out of medication need rx sent to [facility].Called pt to verify if appt has been made with new PCP. Pt states she will be seeing Dr. Loney Loh w/[facility] on Feb 18th. Inform pt will send 30 day to [facility] until she estab w/ new PCP...Johny Chess

## 2015-06-25 ENCOUNTER — Encounter: Payer: Self-pay | Admitting: Women's Health

## 2015-06-25 ENCOUNTER — Ambulatory Visit (INDEPENDENT_AMBULATORY_CARE_PROVIDER_SITE_OTHER): Payer: Medicare Other | Admitting: Women's Health

## 2015-06-25 ENCOUNTER — Telehealth: Payer: Self-pay | Admitting: *Deleted

## 2015-06-25 VITALS — BP 134/80 | Ht 59.0 in | Wt 183.0 lb

## 2015-06-25 DIAGNOSIS — N898 Other specified noninflammatory disorders of vagina: Secondary | ICD-10-CM | POA: Diagnosis not present

## 2015-06-25 DIAGNOSIS — N3000 Acute cystitis without hematuria: Secondary | ICD-10-CM | POA: Diagnosis not present

## 2015-06-25 DIAGNOSIS — R300 Dysuria: Secondary | ICD-10-CM

## 2015-06-25 LAB — URINALYSIS W MICROSCOPIC + REFLEX CULTURE
Bilirubin Urine: NEGATIVE
CASTS: NONE SEEN [LPF]
Crystals: NONE SEEN [HPF]
Glucose, UA: NEGATIVE
KETONES UR: NEGATIVE
Nitrite: NEGATIVE
PH: 5.5 (ref 5.0–8.0)
Specific Gravity, Urine: 1.025 (ref 1.001–1.035)
WBC, UA: >60 WBC/HPF — AB (ref <=5)
YEAST: NONE SEEN [HPF]

## 2015-06-25 LAB — WET PREP FOR TRICH, YEAST, CLUE
CLUE CELLS WET PREP: NONE SEEN
TRICH WET PREP: NONE SEEN

## 2015-06-25 MED ORDER — NYSTATIN-TRIAMCINOLONE 100000-0.1 UNIT/GM-% EX OINT: 1.0000 "application " | TOPICAL_OINTMENT | Freq: Two times a day (BID) | CUTANEOUS | Status: DC

## 2015-06-25 MED ORDER — FLUCONAZOLE 150 MG PO TABS: 150.0000 mg | ORAL_TABLET | Freq: Once | ORAL | Status: DC

## 2015-06-25 MED ORDER — NYSTATIN 100000 UNIT/GM EX OINT: 1.0000 "application " | TOPICAL_OINTMENT | Freq: Two times a day (BID) | CUTANEOUS | Status: DC

## 2015-06-25 MED ORDER — TRIAMCINOLONE ACETONIDE 0.1 % EX OINT: 1.0000 "application " | TOPICAL_OINTMENT | Freq: Two times a day (BID) | CUTANEOUS | Status: DC

## 2015-06-25 MED ORDER — CIPROFLOXACIN HCL 250 MG PO TABS: 250.0000 mg | ORAL_TABLET | Freq: Two times a day (BID) | ORAL | Status: DC

## 2015-06-25 NOTE — Telephone Encounter (Signed)
Pt medication for nystatin-triamcinolone will be cheaper if sent as 2 separate Rx. Rx will be sent.

## 2015-06-25 NOTE — Patient Instructions (Signed)
Monilial Vaginitis Vaginitis in a soreness, swelling and redness (inflammation) of the vagina and vulva. Monilial vaginitis is not a sexually transmitted infection. CAUSES  Yeast vaginitis is caused by yeast (candida) that is normally found in your vagina. With a yeast infection, the candida has overgrown in number to a point that upsets the chemical balance. SYMPTOMS   White, thick vaginal discharge.  Swelling, itching, redness and irritation of the vagina and possibly the lips of the vagina (vulva).  Burning or painful urination.  Painful intercourse. DIAGNOSIS  Things that may contribute to monilial vaginitis are:  Postmenopausal and virginal states.  Pregnancy.  Infections.  Being tired, sick or stressed, especially if you had monilial vaginitis in the past.  Diabetes. Good control will help lower the chance.  Birth control pills.  Tight fitting garments.  Using bubble bath, feminine sprays, douches or deodorant tampons.  Taking certain medications that kill germs (antibiotics).  Sporadic recurrence can occur if you become ill. TREATMENT  Your caregiver will give you medication.  There are several kinds of anti monilial vaginal creams and suppositories specific for monilial vaginitis. For recurrent yeast infections, use a suppository or cream in the vagina 2 times a week, or as directed.  Anti-monilial or steroid cream for the itching or irritation of the vulva may also be used. Get your caregiver's permission.  Painting the vagina with methylene blue solution may help if the monilial cream does not work.  Eating yogurt may help prevent monilial vaginitis. HOME CARE INSTRUCTIONS   Finish all medication as prescribed.  Do not have sex until treatment is completed or after your caregiver tells you it is okay.  Take warm sitz baths.  Do not douche.  Do not use tampons, especially scented ones.  Wear cotton underwear.  Avoid tight pants and panty  hose.  Tell your sexual partner that you have a yeast infection. They should go to their caregiver if they have symptoms such as mild rash or itching.  Your sexual partner should be treated as well if your infection is difficult to eliminate.  Practice safer sex. Use condoms.  Some vaginal medications cause latex condoms to fail. Vaginal medications that harm condoms are:  Cleocin cream.  Butoconazole (Femstat).  Terconazole (Terazol) vaginal suppository.  Miconazole (Monistat) (may be purchased over the counter). SEEK MEDICAL CARE IF:   You have a temperature by mouth above 102 F (38.9 C).  The infection is getting worse after 2 days of treatment.  The infection is not getting better after 3 days of treatment.  You develop blisters in or around your vagina.  You develop vaginal bleeding, and it is not your menstrual period.  You have pain when you urinate.  You develop intestinal problems.  You have pain with sexual intercourse.   This information is not intended to replace advice given to you by your health care provider. Make sure you discuss any questions you have with your health care provider.   Document Released: 02/05/2005 Document Revised: 07/21/2011 Document Reviewed: 10/30/2014 Elsevier Interactive Patient Education 2016 Elsevier Inc. Urinary Tract Infection Urinary tract infections (UTIs) can develop anywhere along your urinary tract. Your urinary tract is your body's drainage system for removing wastes and extra water. Your urinary tract includes two kidneys, two ureters, a bladder, and a urethra. Your kidneys are a pair of bean-shaped organs. Each kidney is about the size of your fist. They are located below your ribs, one on each side of your spine. CAUSES Infections are  caused by microbes, which are microscopic organisms, including fungi, viruses, and bacteria. These organisms are so small that they can only be seen through a microscope. Bacteria are  the microbes that most commonly cause UTIs. SYMPTOMS  Symptoms of UTIs may vary by age and gender of the patient and by the location of the infection. Symptoms in young women typically include a frequent and intense urge to urinate and a painful, burning feeling in the bladder or urethra during urination. Older women and men are more likely to be tired, shaky, and weak and have muscle aches and abdominal pain. A fever may mean the infection is in your kidneys. Other symptoms of a kidney infection include pain in your back or sides below the ribs, nausea, and vomiting. DIAGNOSIS To diagnose a UTI, your caregiver will ask you about your symptoms. Your caregiver will also ask you to provide a urine sample. The urine sample will be tested for bacteria and white blood cells. White blood cells are made by your body to help fight infection. TREATMENT  Typically, UTIs can be treated with medication. Because most UTIs are caused by a bacterial infection, they usually can be treated with the use of antibiotics. The choice of antibiotic and length of treatment depend on your symptoms and the type of bacteria causing your infection. HOME CARE INSTRUCTIONS  If you were prescribed antibiotics, take them exactly as your caregiver instructs you. Finish the medication even if you feel better after you have only taken some of the medication.  Drink enough water and fluids to keep your urine clear or pale yellow.  Avoid caffeine, tea, and carbonated beverages. They tend to irritate your bladder.  Empty your bladder often. Avoid holding urine for long periods of time.  Empty your bladder before and after sexual intercourse.  After a bowel movement, women should cleanse from front to back. Use each tissue only once. SEEK MEDICAL CARE IF:   You have back pain.  You develop a fever.  Your symptoms do not begin to resolve within 3 days. SEEK IMMEDIATE MEDICAL CARE IF:   You have severe back pain or lower  abdominal pain.  You develop chills.  You have nausea or vomiting.  You have continued burning or discomfort with urination. MAKE SURE YOU:   Understand these instructions.  Will watch your condition.  Will get help right away if you are not doing well or get worse.   This information is not intended to replace advice given to you by your health care provider. Make sure you discuss any questions you have with your health care provider.   Document Released: 02/05/2005 Document Revised: 01/17/2015 Document Reviewed: 06/06/2011 Elsevier Interactive Patient Education [employer].

## 2015-06-25 NOTE — Progress Notes (Signed)
Patient ID: Mary Roth, female   DOB: 1929/05/09, 83 y.o.   MRN: [id number] Presents with complaint of vaginal irritation with itching, burning at initiation of urination with frequency. Has some urinary incontinence/no change, wears a pad daily. Denies vaginal odor, nausea, fever or constipation.  Exam: Appears well, became tearful, reports being extremely worried about husband who has lung cancer and is restarting chemotherapy. External genitalia erythematous with mild erosion, wet prep done with Q-tip, positive for a few yeast. UA: Trace blood, positive leukocytes, greater than 60 WBCs, many bacteria, 0 did 2 RBCs, 20-40 squamous epithelials.  UTI with hematuria Yeast vaginitis Mild Urinary incontinence/perineal irritation Situational stress  Plan: Cipro 250 twice daily for 3 days, increase fluids, UTI prevention discussed. Diflucan 150 by mouth 1 dose, alternate small amount of Mycolog ointment with A and D ointment, keep dry as best able, continue loose clothes. Avoid pads as best she can. Urine culture pending.

## 2015-06-25 NOTE — Addendum Note (Signed)
Addended by: Burnett Kanaris on: 06/25/2015 02:18 PM   Modules accepted: Orders

## 2015-06-27 LAB — URINE CULTURE

## 2015-07-08 DIAGNOSIS — J4540 Moderate persistent asthma, uncomplicated: Secondary | ICD-10-CM | POA: Insufficient documentation

## 2015-11-07 ENCOUNTER — Encounter: Payer: Self-pay | Admitting: Allergy and Immunology

## 2015-11-07 ENCOUNTER — Ambulatory Visit (INDEPENDENT_AMBULATORY_CARE_PROVIDER_SITE_OTHER): Payer: Medicare Other | Admitting: Allergy and Immunology

## 2015-11-07 VITALS — BP 164/62 | HR 78 | Temp 98.1°F | Resp 20 | Ht 58.25 in | Wt 187.6 lb

## 2015-11-07 DIAGNOSIS — T783XXA Angioneurotic edema, initial encounter: Secondary | ICD-10-CM | POA: Diagnosis not present

## 2015-11-07 DIAGNOSIS — Z91038 Other insect allergy status: Secondary | ICD-10-CM | POA: Diagnosis not present

## 2015-11-07 DIAGNOSIS — T7840XA Allergy, unspecified, initial encounter: Secondary | ICD-10-CM | POA: Insufficient documentation

## 2015-11-07 DIAGNOSIS — J4520 Mild intermittent asthma, uncomplicated: Secondary | ICD-10-CM | POA: Diagnosis not present

## 2015-11-07 MED ORDER — EPINEPHRINE 0.3 MG/0.3ML IJ SOAJ: 0.3000 mg | Freq: Once | INTRAMUSCULAR | Status: DC

## 2015-11-07 MED ORDER — ALBUTEROL SULFATE (2.5 MG/3ML) 0.083% IN NEBU: 2.5000 mg | INHALATION_SOLUTION | RESPIRATORY_TRACT | Status: DC | PRN

## 2015-11-07 MED ORDER — ALBUTEROL SULFATE HFA 108 (90 BASE) MCG/ACT IN AERS: 2.0000 | INHALATION_SPRAY | Freq: Four times a day (QID) | RESPIRATORY_TRACT | Status: DC | PRN

## 2015-11-07 NOTE — Assessment & Plan Note (Addendum)
The patient's history suggests food allergy shellfish and fish. We were unable to perform skin tests today due to recent administration of antihistamine.   She will return for food allergen skin testing in the near future.    Until this allergy has been definitively ruled out, she is to meticulously avoid shellfish and fish.    A prescription has been provided for epinephrine auto-injector 2 pack along with instructions for proper administration.  A food allergy action plan has been provided and discussed.  Medic Alert identification is recommended.

## 2015-11-07 NOTE — Assessment & Plan Note (Signed)
   Albuterol HFA, 1-2 inhalations every 4-6 hours as needed.  During respiratory tract infections and asthma flares, the patient may add Advair 250/50 g, one inhalation twice a day until symptoms have returned to baseline.  Subjective and objective measures of pulmonary function will be followed and the treatment plan will be adjusted accordingly.

## 2015-11-07 NOTE — Assessment & Plan Note (Signed)
Patient's history suggests hymenoptera venom hypersensitivity.  She has not been stung in the past 25 years.  Continue avoidance of stinging insects.  A prescription has been provided for epinephrine autoinjector (as above).  During her next blood draw, will check serum specific IgE against hymenoptera venom panel.

## 2015-11-07 NOTE — Patient Instructions (Addendum)
Allergic reaction The patient's history suggests food allergy shellfish and fish. We were unable to perform skin tests today due to recent administration of antihistamine.   She will return for food allergen skin testing in the near future.    Until this allergy has been definitively ruled out, she is to meticulously avoid shellfish and fish.    A prescription has been provided for epinephrine auto-injector 2 pack along with instructions for proper administration.  A food allergy action plan has been provided and discussed.  Medic Alert identification is recommended.  Mild intermittent asthma  Albuterol HFA, 1-2 inhalations every 4-6 hours as needed.  During respiratory tract infections and asthma flares, the patient may add Advair 250/50 g, one inhalation twice a day until symptoms have returned to baseline.  Subjective and objective measures of pulmonary function will be followed and the treatment plan will be adjusted accordingly.  History of insect sting allergy Patient's history suggests hymenoptera venom hypersensitivity.  She has not been stung in the past 25 years.  Continue avoidance of stinging insects.  A prescription has been provided for epinephrine autoinjector (as above).  During her next blood draw, will check serum specific IgE against hymenoptera venom panel.   Return for food allergen skin testing after having been off of antihistamines for 3 days.

## 2015-11-07 NOTE — Progress Notes (Signed)
New Patient Note  RE: Mary Roth MRN: [id number] DOB: August 03, 1928 Date of Office Visit: 11/07/2015  Referring provider: Karleen Hampshire., MD Primary care provider: [name], MD  Chief Complaint: Allergic Reaction   History of present illness: HPI Comments: Mary Roth is a 82 y.o. female presenting today for consultation of reactions.  She reports that 2 weeks ago she consumed a Caesar salad with anchovies and shortly thereafter developed mild swelling of her lips and tongue.  The swelling resolved spontaneously without intervention.  This past Friday night, she consumed shrimp and developed swelling of lips.  She denies concomitant urticaria, cardiopulmonary symptoms, or other GI symptoms.  2 nights ago, she consumed shrimp for dinner at 7 PM, went to bed at 10 PM without symptoms, and woke up at 3 AM with unit again tongue swelling.  She took 2 doses of diphenhydramine, however it took 12-16 hours for the swelling to completely resolve.  She notes that she was bitten by a tick in early May.  She denies having consumed beef, pork, lamb, venison, or bison within 6-8 hours prior to the episodes of swelling.  She does not take an ACE inhibitor.  She denies insect stings or bites prior to the onset of symptoms.  She notes a 25 years ago she was stung by a flying insect on the leg and rapidly developed severe facial swelling including swelling of the eyelids and tongue. She was treated by her primary care physician with epinephrine and was prescribed an epinephrine autoinjector, however she stopped caring the autoinjector years ago.  She has not been stung again in the past 25 years.  She has a history of asthma which she believes she has "outgrown."  She experiences mild lower respiratory symptoms one time per month on average.  She does not have an albuterol rescue inhaler, but instead uses Advair 250/50 mg if needed for rescue.   Assessment and plan: Allergic reaction The patient's history  suggests food allergy shellfish and fish. We were unable to perform skin tests today due to recent administration of antihistamine.   She will return for food allergen skin testing in the near future.    Until this allergy has been definitively ruled out, she is to meticulously avoid shellfish and fish.    A prescription has been provided for epinephrine auto-injector 2 pack along with instructions for proper administration.  A food allergy action plan has been provided and discussed.  Medic Alert identification is recommended.  Mild intermittent asthma  Albuterol HFA, 1-2 inhalations every 4-6 hours as needed.  During respiratory tract infections and asthma flares, the patient may add Advair 250/50 g, one inhalation twice a day until symptoms have returned to baseline.  Subjective and objective measures of pulmonary function will be followed and the treatment plan will be adjusted accordingly.  History of insect sting allergy Patient's history suggests hymenoptera venom hypersensitivity.  She has not been stung in the past 25 years.  Continue avoidance of stinging insects.  A prescription has been provided for epinephrine autoinjector (as above).  During her next blood draw, will check serum specific IgE against hymenoptera venom panel.    Meds ordered this encounter  Medications  . EPINEPHrine (EPIPEN 2-PAK) 0.3 mg/0.3 mL IJ SOAJ injection    Sig: Inject 0.3 mLs (0.3 mg total) into the muscle once.    Dispense:  2 Device    Refill:  1    Patient has coupon  . albuterol (PROVENTIL HFA;VENTOLIN HFA)  108 (90 Base) MCG/ACT inhaler    Sig: Inhale 2 puffs into the lungs every 6 (six) hours as needed for wheezing or shortness of breath.    Dispense:  1 Inhaler    Refill:  2  . albuterol (PROVENTIL) (2.5 MG/3ML) 0.083% nebulizer solution    Sig: Take 3 mLs (2.5 mg total) by nebulization every 4 (four) hours as needed for wheezing.    Dispense:  90 mL    Refill:  3     Diagnositics: Spirometry:  Normal with an FEV1 of 121% predicted.  Please see scanned spirometry results for details.    Physical examination: Blood pressure 164/62, pulse 78, temperature 98.1 F (36.7 C), temperature source Oral, resp. rate 20, height 4' 10.25" (1.48 m), weight 187 lb 9.6 oz (85.095 kg).  General: Alert, interactive, in no acute distress. Lungs: Clear to auscultation without wheezing, rhonchi or rales. CV: Normal S1, S2 without murmurs. Abdomen: Nondistended, nontender. Skin: Warm and dry, without lesions or rashes. Extremities:  No clubbing, cyanosis or edema. Neuro:   Grossly intact.  Review of systems:  Review of Systems  Constitutional: Negative for fever, chills and weight loss.  HENT: Negative for nosebleeds.   Eyes: Negative for blurred vision.  Respiratory: Positive for wheezing. Negative for hemoptysis.   Cardiovascular: Negative for chest pain.  Gastrointestinal: Negative for diarrhea and constipation.  Genitourinary: Negative for dysuria.  Musculoskeletal: Negative for myalgias and joint pain.  Skin: Negative for itching and rash.  Neurological: Negative for dizziness.  Endo/Heme/Allergies: Does not bruise/bleed easily.    Past medical history:  Past Medical History  Diagnosis Date  . Personal history of colonic polyps 08/21/2006    ADENOMATOUS POLYP  . Hepatic steatosis   . Abnormal liver function test   . Mastodynia   . Unspecified essential hypertension   . Other and unspecified hyperlipidemia   . Esophageal reflux   . Unspecified asthma(493.90) 2003     no childhood asthma  . Edema   . Unspecified hypothyroidism   . Arthritis   . Osteopenia 03/2012    T score -1.4 FRAX 20%/11%; Dr Phineas Real    Past surgical history:  Past Surgical History  Procedure Laterality Date  . Appendectomy    . Cholecystectomy  1973     for stones  . Tonsillectomy    . Uterine suspension  1968    [place] , [place]    Family history: Family  History  Problem Relation Age of Onset  . Uterine cancer Mother   . Ovarian cancer Mother   . Eczema Mother   . Heart attack Father 56  . Prostate cancer Father   . Breast cancer Sister     x 3 sisters  . Asthma Sister   . Food Allergy Sister   . Alcohol abuse Brother     x 3  . Prostate cancer Brother     x 3 brothers  . Pancreatic cancer Brother     x 2  . Diabetes Neg Hx   . Stroke Neg Hx   . Allergic rhinitis Neg Hx   . Urticaria Neg Hx     Social history: Social History   Social History  . Marital Status: Married    Spouse Name: N/A  . Number of Children: 2  . Years of Education: N/A   Occupational History  . homemaker    Social History Main Topics  . Smoking status: Never Smoker   . Smokeless tobacco: Never Used  . Alcohol  Use: 2.4 oz/week    4 Shots of liquor per week     Comment:  socially  . Drug Use: No  . Sexual Activity: No   Other Topics Concern  . Not on file   Social History Narrative   Environmental History: The patient lives in an apartment with carpeting throughout and central air/heat.  There is a dog in the apartment which has access to her bedroom.  She is a nonsmoker.    Medication List       This list is accurate as of: 11/07/15 10:42 AM.  Always use your most recent med list.               ADVAIR DISKUS 250-50 MCG/DOSE Aepb  Generic drug:  Fluticasone-Salmeterol  USE 1 PUFF BY MOUTH EVERY12 HOURS GARGLE ANDSPIT AFTER EACH USE     albuterol 108 (90 Base) MCG/ACT inhaler  Commonly known as:  PROVENTIL HFA;VENTOLIN HFA  Inhale 2 puffs into the lungs every 6 (six) hours as needed for wheezing or shortness of breath.     albuterol (2.5 MG/3ML) 0.083% nebulizer solution  Commonly known as:  PROVENTIL  Take 3 mLs (2.5 mg total) by nebulization every 4 (four) hours as needed for wheezing.     amLODipine 2.5 MG tablet  Commonly known as:  NORVASC  TAKE 1 TABLET BY MOUTH DAILY *PLEASE KEEP UPCOMING APPOINTMENT     EPINEPHrine  0.3 mg/0.3 mL Soaj injection  Commonly known as:  EPIPEN 2-PAK  Inject 0.3 mLs (0.3 mg total) into the muscle once.     fexofenadine 180 MG tablet  Commonly known as:  ALLEGRA  Take 180 mg by mouth daily as needed (asthma).     levothyroxine 88 MCG tablet  Commonly known as:  SYNTHROID, LEVOTHROID  Take 1 tablet (88 mcg total) by mouth daily. Must keep appt w/new provider for future refills     montelukast 10 MG tablet  Commonly known as:  SINGULAIR  Take 10 mg by mouth as needed.     nebivolol 5 MG tablet  Commonly known as:  BYSTOLIC  Take 1 tablet (5 mg total) by mouth daily.     NEXIUM 40 MG capsule  Generic drug:  esomeprazole  TAKE 1 CAPSULE BY MOUTH ONCE A DAY     rOPINIRole 0.25 MG tablet  Commonly known as:  REQUIP  Take 1 tablet (0.25 mg total) by mouth at bedtime. 1 pill 2 hrs pre bedtime     rosuvastatin 5 MG tablet  Commonly known as:  CRESTOR     spironolactone 25 MG tablet  Commonly known as:  ALDACTONE  TAKE 1 TABLET BY MOUTH 2 TIMES DAILY.     triamcinolone ointment 0.1 %  Commonly known as:  KENALOG  Apply 1 application topically 2 (two) times daily.        Known medication allergies: Allergies  Allergen Reactions  . Phenergan [Promethazine Hcl] Nausea And Vomiting    I appreciate the opportunity to take part in [place] care. Please do not hesitate to contact me with questions.  Sincerely,   R. Edgar Frisk, MD

## 2015-12-05 ENCOUNTER — Encounter: Payer: Self-pay | Admitting: Allergy and Immunology

## 2015-12-05 ENCOUNTER — Ambulatory Visit (INDEPENDENT_AMBULATORY_CARE_PROVIDER_SITE_OTHER): Payer: Medicare Other | Admitting: Allergy and Immunology

## 2015-12-05 ENCOUNTER — Encounter (INDEPENDENT_AMBULATORY_CARE_PROVIDER_SITE_OTHER): Payer: Self-pay

## 2015-12-05 ENCOUNTER — Ambulatory Visit: Payer: Medicare Other | Admitting: Allergy and Immunology

## 2015-12-05 VITALS — BP 130/68 | HR 64 | Temp 98.3°F | Resp 16

## 2015-12-05 DIAGNOSIS — J3089 Other allergic rhinitis: Secondary | ICD-10-CM | POA: Diagnosis not present

## 2015-12-05 DIAGNOSIS — T7840XD Allergy, unspecified, subsequent encounter: Secondary | ICD-10-CM

## 2015-12-05 DIAGNOSIS — Z91038 Other insect allergy status: Secondary | ICD-10-CM

## 2015-12-05 DIAGNOSIS — T783XXD Angioneurotic edema, subsequent encounter: Secondary | ICD-10-CM

## 2015-12-05 DIAGNOSIS — J4520 Mild intermittent asthma, uncomplicated: Secondary | ICD-10-CM

## 2015-12-05 MED ORDER — FLUTICASONE PROPIONATE 50 MCG/ACT NA SUSP: 2.0000 | Freq: Every day | NASAL | 5 refills | Status: DC | PRN

## 2015-12-05 NOTE — Assessment & Plan Note (Signed)
   For now, continue albuterol HFA, 1-2 inhalations every 4-6 hours as needed.  During respiratory tract infections and asthma flares, add Advair 250/50 g, one inhalation twice a day until symptoms have returned to baseline.  Subjective and objective measures of pulmonary function will be followed and the treatment plan will be adjusted accordingly.

## 2015-12-05 NOTE — Assessment & Plan Note (Signed)
   Aeroallergen avoidance measures have been discussed and provided in written form.  Loratadine 10 mg daily as needed.  A prescription has been provided for fluticasone nasal spray, 2 sprays per nostril daily as needed. Proper nasal spray technique has been discussed and demonstrated.  I have also recommended nasal saline spray (i.e. Simply Saline) as needed prior to medicated nasal sprays.

## 2015-12-05 NOTE — Assessment & Plan Note (Signed)
   A lab order form has been provided for serum specific IgE against hymenoptera venom panel.  Continue avoidance of stinging insects and have access to epinephrine auto-injector 2 pack.

## 2015-12-05 NOTE — Assessment & Plan Note (Signed)
Unclear etiology.  Food allergen skin tests were negative despite a positive histamine control.  The following labs have been ordered: TSH, antithyroglobulin antibody, thyroid peroxidase antibody, FCeRI antibody, tryptase, C4, C1 esterase inhibitor (quantitative and functional), C1q, factor XII, CBC, CMP, serum specific IgE against peanut, peanut component, tree nut panel, shellfish panel, and alpha gal panel.  The patient will be notified with further recommendations after lab results have returned.  For now, continue avoidance of shellfish and peanuts.  Instructions have been provided and discussed for H1/H2 receptor blockade with step-wise increase/decrease to find lowest effective dose.  Should symptoms recur, a  journal is to be kept recording any foods eaten, beverages consumed, medications taken, activities performed, and environmental conditions within a 6 hour period prior to the onset of symptoms. For any symptoms concerning for anaphylaxis, 911 is to be called immediately.

## 2015-12-05 NOTE — Progress Notes (Signed)
Follow-up Note  RE: Mary Roth MRN: [id number] DOB: 1929-03-20 Date of Office Visit: 12/05/2015  Primary care provider: [name], MD Referring provider: Karleen Hampshire., MD  History of present illness: Mary Roth is a 82 y.o. female with history of allergic reactions, intermittent asthma, rhinitis, and probable hymenoptera venom hypersensitivity presents today for follow up and allergy skin testing.  She was last seen in this clinic on 11/07/2015.  She was unable to undergo allergy skin testing during that visit due to recent administration of antihistamine.  She reports that she has had 2 allergic reactions over the past month.  On one occasion, she had developed lip swelling shortly after consuming shrimp.  And on another occasion she developed lip and tongue swelling after having consumed gin and tonic, crackers, goat cheese, cranberries, and peanuts.  She did not experience concomitant cardiopulmonary or other GI symptoms on either occasion.  Liz experiences nasal congestion and rhinorrhea. No significant seasonal symptom variation has been noted nor have specific environmental triggers been identified.  She has not experienced significant asthma symptoms over the past month.   Assessment and plan: Allergic reaction Unclear etiology.  Food allergen skin tests were negative despite a positive histamine control.  The following labs have been ordered: TSH, antithyroglobulin antibody, thyroid peroxidase antibody, FCeRI antibody, tryptase, C4, C1 esterase inhibitor (quantitative and functional), C1q, factor XII, CBC, CMP, serum specific IgE against peanut, peanut component, tree nut panel, shellfish panel, and alpha gal panel.  The patient will be notified with further recommendations after lab results have returned.  For now, continue avoidance of shellfish and peanuts.  Instructions have been provided and discussed for H1/H2 receptor blockade with step-wise increase/decrease  to find lowest effective dose.  Should symptoms recur, a  journal is to be kept recording any foods eaten, beverages consumed, medications taken, activities performed, and environmental conditions within a 6 hour period prior to the onset of symptoms. For any symptoms concerning for anaphylaxis, 911 is to be called immediately.  Perennial and seasonal allergic rhinitis  Aeroallergen avoidance measures have been discussed and provided in written form.  Loratadine 10 mg daily as needed.  A prescription has been provided for fluticasone nasal spray, 2 sprays per nostril daily as needed. Proper nasal spray technique has been discussed and demonstrated.  I have also recommended nasal saline spray (i.e. Simply Saline) as needed prior to medicated nasal sprays.  Mild intermittent asthma  For now, continue albuterol HFA, 1-2 inhalations every 4-6 hours as needed.  During respiratory tract infections and asthma flares, add Advair 250/50 g, one inhalation twice a day until symptoms have returned to baseline.  Subjective and objective measures of pulmonary function will be followed and the treatment plan will be adjusted accordingly.  History of insect sting allergy  A lab order form has been provided for serum specific IgE against hymenoptera venom panel.  Continue avoidance of stinging insects and have access to epinephrine auto-injector 2 pack.   Meds ordered this encounter  Medications  . fluticasone (FLONASE) 50 MCG/ACT nasal spray    Sig: Place 2 sprays into both nostrils daily as needed for allergies or rhinitis.    Dispense:  16 g    Refill:  5    Diagnositics: Spirometry:  Normal with an FEV1 of 125% predicted.  Please see scanned spirometry results for details. Environmental skin testing: Positive for ragweed pollen, mold, and dust mite antigen. Food allergen skin testing: Negative despite a positive histamine control.  Physical examination: Blood pressure 130/68, pulse  64, temperature 98.3 F (36.8 C), temperature source Oral, resp. rate 16.  General: Alert, interactive, in no acute distress. HEENT: TMs pearly gray, turbinates moderately edematous without discharge, post-pharynx moderately erythematous. Neck: Supple without lymphadenopathy. Lungs: Clear to auscultation without wheezing, rhonchi or rales. CV: Normal S1, S2 without murmurs. Skin: Warm and dry, without lesions or rashes.  The following portions of the patient's history were reviewed and updated as appropriate: allergies, current medications, past family history, past medical history, past social history, past surgical history and problem list.    Medication List       Accurate as of 12/05/15  7:44 PM. Always use your most recent med list.          ADVAIR DISKUS 250-50 MCG/DOSE Aepb Generic drug:  Fluticasone-Salmeterol USE 1 PUFF BY MOUTH EVERY12 HOURS GARGLE ANDSPIT AFTER EACH USE   albuterol 108 (90 Base) MCG/ACT inhaler Commonly known as:  PROVENTIL HFA;VENTOLIN HFA Inhale into the lungs.   albuterol (2.5 MG/3ML) 0.083% nebulizer solution Commonly known as:  PROVENTIL Take 3 mLs (2.5 mg total) by nebulization every 4 (four) hours as needed for wheezing.   amLODipine 2.5 MG tablet Commonly known as:  NORVASC TAKE 1 TABLET BY MOUTH DAILY *PLEASE KEEP UPCOMING APPOINTMENT   EPINEPHrine 0.3 mg/0.3 mL Soaj injection Commonly known as:  EPIPEN 2-PAK Inject 0.3 mLs (0.3 mg total) into the muscle once.   fexofenadine 180 MG tablet Commonly known as:  ALLEGRA Take 180 mg by mouth daily as needed (asthma).   fluticasone 50 MCG/ACT nasal spray Commonly known as:  FLONASE Place 2 sprays into both nostrils daily as needed for allergies or rhinitis.   levothyroxine 88 MCG tablet Commonly known as:  SYNTHROID, LEVOTHROID Take 1 tablet (88 mcg total) by mouth daily. Must keep appt w/new provider for future refills   methylPREDNISolone 4 MG Tbpk tablet Commonly known as:  MEDROL  DOSEPAK follow package directions. PT. HAS ONE MORE DOSE.   montelukast 10 MG tablet Commonly known as:  SINGULAIR Take 10 mg by mouth as needed.   nebivolol 5 MG tablet Commonly known as:  BYSTOLIC Take 1 tablet (5 mg total) by mouth daily.   nystatin-triamcinolone ointment Commonly known as:  MYCOLOG Apply topically.   rOPINIRole 0.25 MG tablet Commonly known as:  REQUIP Take 1 tablet (0.25 mg total) by mouth at bedtime. 1 pill 2 hrs pre bedtime   rosuvastatin 5 MG tablet Commonly known as:  CRESTOR   spironolactone 25 MG tablet Commonly known as:  ALDACTONE TAKE 1 TABLET BY MOUTH 2 TIMES DAILY.   triamcinolone ointment 0.1 % Commonly known as:  KENALOG Apply 1 application topically 2 (two) times daily.       Allergies  Allergen Reactions  . Peanut Oil Swelling  . Phenergan [Promethazine Hcl] Nausea And Vomiting   Review of systems: Constitutional: Negative for fever, chills and weight loss.  HENT: Negative for nosebleeds.   Positive for nasal congestion and rhinorrhea.  Positive for lip and tongue swelling. Eyes: Negative for blurred vision.  Respiratory: Negative for hemoptysis.   Cardiovascular: Negative for chest pain.  Gastrointestinal: Negative for diarrhea and constipation.  Genitourinary: Negative for dysuria.  Musculoskeletal: Negative for myalgias and joint pain.  Neurological: Negative for dizziness.  Endo/Heme/Allergies: Does not bruise/bleed easily.  Cutaneous: Negative for rash.  Past Medical History:  Diagnosis Date  . Abnormal liver function test   . Arthritis   . Edema   .  Esophageal reflux   . Hepatic steatosis   . Mastodynia   . Osteopenia 03/2012   T score -1.4 FRAX 20%/11%; Dr Phineas Real  . Other and unspecified hyperlipidemia   . Personal history of colonic polyps 08/21/2006   ADENOMATOUS POLYP  . Unspecified asthma(493.90) 2003    no childhood asthma  . Unspecified essential hypertension   . Unspecified hypothyroidism      Family History  Problem Relation Age of Onset  . Uterine cancer Mother   . Ovarian cancer Mother   . Eczema Mother   . Heart attack Father 41  . Prostate cancer Father   . Breast cancer Sister     x 3 sisters  . Asthma Sister   . Food Allergy Sister   . Alcohol abuse Brother     x 3  . Prostate cancer Brother     x 3 brothers  . Pancreatic cancer Brother     x 2  . Diabetes Neg Hx   . Stroke Neg Hx   . Allergic rhinitis Neg Hx   . Urticaria Neg Hx     Social History   Social History  . Marital status: Married    Spouse name: N/A  . Number of children: 2  . Years of education: N/A   Occupational History  . homemaker Retired   Social History Main Topics  . Smoking status: Never Smoker  . Smokeless tobacco: Never Used  . Alcohol use 2.4 oz/week    4 Shots of liquor per week     Comment:  socially  . Drug use: No  . Sexual activity: No   Other Topics Concern  . Not on file   Social History Narrative  . No narrative on file    I appreciate the opportunity to take part in [place] care. Please do not hesitate to contact me with questions.  Sincerely,   R. Edgar Frisk, MD

## 2015-12-05 NOTE — Patient Instructions (Addendum)
Allergic reaction Unclear etiology.  Food allergen skin tests were negative despite a positive histamine control.  The following labs have been ordered: TSH, antithyroglobulin antibody, thyroid peroxidase antibody, FCeRI antibody, tryptase, C4, C1 esterase inhibitor (quantitative and functional), C1q, factor XII, CBC, CMP, serum specific IgE against peanut, peanut component, tree nut panel, shellfish panel, and alpha gal panel.  The patient will be notified with further recommendations after lab results have returned.  For now, continue avoidance of shellfish and peanuts.  Instructions have been provided and discussed for H1/H2 receptor blockade with step-wise increase/decrease to find lowest effective dose.  Should symptoms recur, a  journal is to be kept recording any foods eaten, beverages consumed, medications taken, activities performed, and environmental conditions within a 6 hour period prior to the onset of symptoms. For any symptoms concerning for anaphylaxis, 911 is to be called immediately.  Perennial and seasonal allergic rhinitis  Aeroallergen avoidance measures have been discussed and provided in written form.  Loratadine 10 mg daily as needed.  A prescription has been provided for fluticasone nasal spray, 2 sprays per nostril daily as needed. Proper nasal spray technique has been discussed and demonstrated.  I have also recommended nasal saline spray (i.e. Simply Saline) as needed prior to medicated nasal sprays.  Mild intermittent asthma  For now, continue albuterol HFA, 1-2 inhalations every 4-6 hours as needed.  During respiratory tract infections and asthma flares, add Advair 250/50 g, one inhalation twice a day until symptoms have returned to baseline.  Subjective and objective measures of pulmonary function will be followed and the treatment plan will be adjusted accordingly.  History of insect sting allergy  A lab order form has been provided for serum specific  IgE against hymenoptera venom panel.  Continue avoidance of stinging insects and have access to epinephrine auto-injector 2 pack.   When lab results have returned the patient will be called with further recommendations and follow up instructions.

## 2015-12-06 NOTE — Addendum Note (Signed)
Addended by: Katherina Right D on: 12/06/2015 12:23 PM   Modules accepted: Orders

## 2015-12-06 NOTE — Addendum Note (Signed)
Addended by: Katherina Right D on: 12/06/2015 09:25 AM   Modules accepted: Orders

## 2015-12-07 ENCOUNTER — Other Ambulatory Visit: Payer: Self-pay | Admitting: Allergy and Immunology

## 2015-12-10 LAB — ALLERGEN STINGING INSECT PANEL
Honeybee IgE: <0.1 kU/L
Hornet, White Face, IgE: <0.1 kU/L
Hornet, Yellow, IgE: <0.1 kU/L
Paper Wasp IgE: <0.1 kU/L
Yellow Jacket, IgE: <0.1 kU/L

## 2015-12-12 ENCOUNTER — Ambulatory Visit: Payer: Medicare Other | Admitting: Pediatrics

## 2015-12-12 LAB — FACTOR 12 ASSAY: Factor XII Activity: 103 % (ref 50–150)

## 2015-12-12 LAB — ALPHA-GAL PANEL
Alpha Gal IgE*: 6.07 kU/L — ABNORMAL HIGH (ref <0.35)
Beef (Bos spp) IgE: 2.01 kU/L — ABNORMAL HIGH (ref <0.35)
Class Interpretation: 1
Class Interpretation: 2
Class Interpretation: 2
Lamb/Mutton (Ovis spp) IgE: 0.65 kU/L — ABNORMAL HIGH (ref <0.35)
Pork (Sus spp) IgE: 1.61 kU/L — ABNORMAL HIGH (ref <0.35)

## 2015-12-12 LAB — ALLERGENS(7)
Brazil Nut IgE: <0.1 kU/L
F020-IgE Almond: <0.1 kU/L
F202-IgE Cashew Nut: <0.1 kU/L
Hazelnut (Filbert) IgE: <0.1 kU/L
Peanut IgE: <0.1 kU/L
Pecan Nut IgE: <0.1 kU/L
Walnut IgE: <0.1 kU/L

## 2015-12-12 LAB — ALLERGEN PROFILE, SHELLFISH
Clam IgE: <0.1 kU/L
F023-IgE Crab: <0.1 kU/L
F080-IgE Lobster: <0.1 kU/L
F290-IgE Oyster: <0.1 kU/L
Scallop IgE: <0.1 kU/L
Shrimp IgE: <0.1 kU/L

## 2015-12-12 LAB — CHRONIC URTICARIA: cu index: 7.3 (ref <10)

## 2015-12-12 LAB — IGE PEANUT COMPONENT PROFILE
F352-IgE Ara h 8: <0.1 kU/L
F422-IgE Ara h 1: <0.1 kU/L
F423-IgE Ara h 2: <0.1 kU/L
F424-IgE Ara h 3: <0.1 kU/L
F427-IgE Ara h 9: <0.1 kU/L

## 2015-12-13 NOTE — Progress Notes (Signed)
Her serum IgE results show no allergy to peanuts or tree nuts. She is allergic to Alpha-Gal and should avoid beef, lamb, pork and foods with a lot of heavy cream. An alpha gal instruction sheet was sent to the patient and she was notified of the results. Her serum IgE to shellfish was negative and to peanut, and its components was also negative. She should keep her follow-up appointment with Dr. Verlin Fester. He will follow-up with the other testing that he ordered which has not returned She should have Benadryl and EpiPen 0.3 available in case of an allergic reaction

## 2015-12-14 ENCOUNTER — Telehealth: Payer: Self-pay

## 2015-12-14 NOTE — Telephone Encounter (Signed)
Lab results verbally given to patient. Pt. Understood to avoid beef,pork and lamb. Pt. Stated she already had epipen and told pt. We mailed out an Alpha-Gal and Emergency action plan handout.

## 2015-12-15 LAB — COMPREHENSIVE METABOLIC PANEL
ALT: 23 IU/L (ref 0–32)
AST: 20 IU/L (ref 0–40)
Albumin/Globulin Ratio: 1.7 (ref 1.2–2.2)
Albumin: 3.8 g/dL (ref 3.5–4.7)
Alkaline Phosphatase: 87 IU/L (ref 39–117)
BUN/Creatinine Ratio: 26 (ref 12–28)
BUN: 23 mg/dL (ref 8–27)
Bilirubin Total: 0.4 mg/dL (ref 0.0–1.2)
CO2: 21 mmol/L (ref 18–29)
Calcium: 9 mg/dL (ref 8.7–10.3)
Chloride: 98 mmol/L (ref 96–106)
Creatinine, Ser: 0.89 mg/dL (ref 0.57–1.00)
GFR calc Af Amer: 68 mL/min/{1.73_m2} (ref >59)
GFR calc non Af Amer: 59 mL/min/{1.73_m2} — ABNORMAL LOW (ref >59)
Globulin, Total: 2.3 g/dL (ref 1.5–4.5)
Glucose: 83 mg/dL (ref 65–99)
Potassium: 4.1 mmol/L (ref 3.5–5.2)
Sodium: 140 mmol/L (ref 134–144)
Total Protein: 6.1 g/dL (ref 6.0–8.5)

## 2015-12-15 LAB — CBC WITH DIFFERENTIAL/PLATELET
Basophils Absolute: 0 10*3/uL (ref 0.0–0.2)
Basos: 0 %
EOS (ABSOLUTE): 0.5 10*3/uL — ABNORMAL HIGH (ref 0.0–0.4)
Eos: 4 %
Hematocrit: 46.5 % (ref 34.0–46.6)
Hemoglobin: 15.3 g/dL (ref 11.1–15.9)
Immature Grans (Abs): 0 10*3/uL (ref 0.0–0.1)
Immature Granulocytes: 0 %
Lymphocytes Absolute: 3.6 10*3/uL — ABNORMAL HIGH (ref 0.7–3.1)
Lymphs: 27 %
MCH: 31.6 pg (ref 26.6–33.0)
MCHC: 32.9 g/dL (ref 31.5–35.7)
MCV: 96 fL (ref 79–97)
Monocytes Absolute: 0.7 10*3/uL (ref 0.1–0.9)
Monocytes: 5 %
Neutrophils Absolute: 8.4 10*3/uL — ABNORMAL HIGH (ref 1.4–7.0)
Neutrophils: 64 %
Platelets: 240 10*3/uL (ref 150–379)
RBC: 4.84 x10E6/uL (ref 3.77–5.28)
RDW: 14.2 % (ref 12.3–15.4)
WBC: 13.3 10*3/uL — ABNORMAL HIGH (ref 3.4–10.8)

## 2015-12-15 LAB — TRYPTASE: Tryptase: 8.5 ug/L (ref 2.2–13.2)

## 2015-12-15 LAB — C1 ESTERASE INHIBITOR: C1INH SerPl-mCnc: 39 mg/dL (ref 21–39)

## 2015-12-15 LAB — COMPLEMENT COMPONENT C1Q: Complement C1Q: 17.6 mg/dL (ref 11.8–24.4)

## 2015-12-15 LAB — C4 COMPLEMENT: Complement C4, Serum: 28 mg/dL (ref 14–44)

## 2015-12-17 ENCOUNTER — Telehealth: Payer: Self-pay | Admitting: Allergy

## 2015-12-17 NOTE — Telephone Encounter (Signed)
CALLED PATIENT AND INFORMED HER WAS MAILING EMERGENCY CARE FORM.

## 2015-12-21 ENCOUNTER — Ambulatory Visit (INDEPENDENT_AMBULATORY_CARE_PROVIDER_SITE_OTHER): Payer: Medicare Other | Admitting: Allergy & Immunology

## 2015-12-21 ENCOUNTER — Encounter: Payer: Self-pay | Admitting: Allergy & Immunology

## 2015-12-21 VITALS — BP 128/80 | HR 75 | Resp 20

## 2015-12-21 DIAGNOSIS — T782XXD Anaphylactic shock, unspecified, subsequent encounter: Secondary | ICD-10-CM

## 2015-12-21 DIAGNOSIS — T783XXD Angioneurotic edema, subsequent encounter: Secondary | ICD-10-CM

## 2015-12-21 DIAGNOSIS — T7840XD Allergy, unspecified, subsequent encounter: Secondary | ICD-10-CM

## 2015-12-21 MED ORDER — CETIRIZINE HCL 10 MG PO CHEW: 10.0000 mg | CHEWABLE_TABLET | Freq: Two times a day (BID) | ORAL | 0 refills | Status: DC

## 2015-12-21 NOTE — Patient Instructions (Signed)
1. Continue with cetirizine 20mg  (two tablets) once daily.  2. I will talk to Dr. Verlin Fester to let him know what happened.  3. Return to clinic on August 22nd at 10:15.  It was a pleasure meeting you!

## 2015-12-21 NOTE — Progress Notes (Signed)
FOLLOW UP  Date of Service/Encounter:  12/21/15   Assessment:   Allergic reaction, subsequent encounter  Angioedema, subsequent encounter  Anaphylaxis, subsequent encounter - Alpha-gal sensitivity testing positive 12/06/15 (beef, lamb, pork, alpha-gal all positive)   Plan/Recommendations:   The patient was only having involvement of one organ system, namely the skin. She was having no problems breathing, no wheezing, no abdominal pain, no hypertension or tachycardia. Therefore, we made the decision to treat with cetirizine 20 mg in the office. We did monitor her vital signs over the next 20-30 minutes. She did start to have resolution of her symptoms within 15 minutes. By the time she left, the swelling was barely notable. She felt markedly improved.  Given the improvement with the cetirizine, I recommended that she stay on suppressive antihistamine dosing of 20 mg nightly. She seemed to have an adverse effect with the Benadryl, as her husband was reporting confusion following administration of the dose this morning. The side effects of Benadryl can be particularly vexing in the adult population, especially with patient's with polypharmacy. Given her apparent sensitivity to the side effects of Benadryl, I recommended that she instead stick to cetirizine, which has markedly decreased penetration through the blood-brain barrier.  I will touch base with Dr. Verlin Fester to see if he has any other ideas regarding her management. Given that she is allergic to these, lamb, pork, I recommended that the patient and her husband carefully read food labels before ingesting anything that might these ingredients in them. She did have tomato soup for dinner last night, which might be related. She is not sure of the ingredients in the seated. They do talk to kitchens about her food allergies, but they did not make a received themselves so there is no completely sure. We did make an appointment in Dr. Verlin Fester  scheduled for August 22. The patient and her husband are aware of how to get hold of [place] after hours.    Subjective:   Mary Roth is a 82 y.o. female presenting today for follow up of No chief complaint on file. Mary Roth has a history of the following: Patient Active Problem List   Diagnosis Date Noted  . Perennial and seasonal allergic rhinitis 12/05/2015  . Allergic reaction 11/07/2015  . Mild intermittent asthma 11/07/2015  . History of insect sting allergy 11/07/2015  . Angioedema 11/07/2015  . Palpitations 02/13/2015  . Rotator cuff tear arthropathy of right shoulder 08/14/2014  . Spondylolisthesis of lumbosacral region 12/20/2013  . Lichen planus [id number]  . Benign paroxysmal positional vertigo 08/20/2013  . Severe obesity (BMI >= 40) ([place]) 05/07/2013  . Asthma, chronic obstructive, with acute exacerbation ([place]) 02/07/2013  . OSTEOPENIA 05/19/2010  . MASTODYNIA 05/17/2009  . Hyperlipidemia 01/12/2009  . Essential hypertension 01/12/2009  . SKIN CANCER, HX OF 01/12/2009  . COLONIC POLYPS, HX OF 01/12/2009  . GERD 10/12/2007  . Asthma 10/07/2007  . EDEMA- LOCALIZED 10/07/2007  . Hypothyroidism 01/15/2007    History obtained from: chart review, patient, and patient's husband.  Mary Roth was referred by [name], MD.     Mary Roth is a 82 y.o. female presenting for an emergent follow up visit for angioedema. Mary Roth was most recently seen by Dr. Verlin Fester on 12/05/15. At that time, she was worked up for multiple allergic reactions involving facial swelling (lip and tongue specifically). Symptoms were localized, however. She underwent testing for HAE as well as alpha-gal sensitivity and lab testing showed that she was sensitive  to alpha-gal as well as all of the red meats. Mary Roth reports that she was recently hospitalized overnight (chest pain - diagnosed as esophageal dysmotility) and came home last night. She then awoke this morning with right sided  lip swelling. She denies any preceding trauama. She denies that it is pruritic or painful. There is no further oral involvement at this time, she is not wheezing, has no abdominal pain or vomiting, and no systemic symptoms. She took benadryl (2 teaspoons) around 7:30am without improvement, which is why they showed up to the office this morning for an evaluation. Her husband also reports that she has been "out of it", although he does admit this did not happen until she took the Benadryl.   Mary Roth denies new exposures, although while in the hospital she was started on doxycycline empirically to treat the tick bite (which occurred in April but apparently is still slightly erythematous). She took one dose in the hospital and then another dose around 7pm last night. She felt fine after each dose. She has taken no medications this morning aside from her blood pressure medication, thyroid medication,and Nexium which she has been on for months. She has not eaten this morning. Vitals this morning are largely normal aside from O2 sat which is 94-95% (normal 98% per patient's husband).   Otherwise, there have been no changes to the past medical history, surgical history, family history, or social history.    Review of Systems: a 14-point review of systems is pertinent for what is mentioned in HPI.  Otherwise, all other systems were negative. Constitutional: negative other than that listed in the HPI Eyes: negative other than that listed in the HPI Ears, nose, mouth, throat, and face: negative other than that listed in the HPI Respiratory: negative other than that listed in the HPI Cardiovascular: negative other than that listed in the HPI Gastrointestinal: negative other than that listed in the HPI Genitourinary: negative other than that listed in the HPI Integument: negative other than that listed in the HPI Hematologic: negative other than that listed in the HPI Musculoskeletal: negative other than that  listed in the HPI Neurological: negative other than that listed in the HPI Allergy/Immunologic: negative other than that listed in the HPI    Objective:   Blood pressure 128/80, pulse 75, resp. rate 20, SpO2 95 %. There is no height or weight on file to calculate BMI.   Note: the initial vitals, although timed at 8:52am, we actually taken closer to 8:20am. We had to wait for her to be placed into a visit so that we could enter the vitals into Epic.      Physical Exam:  General: Alert, interactive, in no acute distress. Talking and cooperative with the exam. Answering questions appropriately.  HEENT: TMs pearly gray, turbinates edematous with thick discharge, post-pharynx mildly erythematous. There is no oral swelling appreciated. The swelling on the right exterior lip is somewhat appreciated on the buccal surface as well.  Neck: Supple without lymphadenopathy. Lungs: Clear to auscultation without wheezing, rhonchi or rales. Moving air well in all lung fields.  CV: Normal S1, S2 without murmurs. Capillary refill <2 seconds.  Abdomen: Nondistended, nontender. Skin:Warm and dry, without lesions or rashes. No urticaria noted.  Extremities: No clubbing, cyanosis or edema. Neuro: Grossly intact. No focal deficits. AAO x3.    Diagnostic studies: None    Salvatore Marvel, MD [facility]

## 2016-01-01 ENCOUNTER — Encounter: Payer: Self-pay | Admitting: Allergy and Immunology

## 2016-01-01 ENCOUNTER — Ambulatory Visit (INDEPENDENT_AMBULATORY_CARE_PROVIDER_SITE_OTHER): Payer: Medicare Other | Admitting: Allergy and Immunology

## 2016-01-01 VITALS — BP 126/66 | HR 68 | Temp 98.3°F | Resp 20

## 2016-01-01 DIAGNOSIS — J4520 Mild intermittent asthma, uncomplicated: Secondary | ICD-10-CM | POA: Diagnosis not present

## 2016-01-01 DIAGNOSIS — J3089 Other allergic rhinitis: Secondary | ICD-10-CM

## 2016-01-01 DIAGNOSIS — T7800XA Anaphylactic reaction due to unspecified food, initial encounter: Secondary | ICD-10-CM | POA: Insufficient documentation

## 2016-01-01 DIAGNOSIS — T7800XD Anaphylactic reaction due to unspecified food, subsequent encounter: Secondary | ICD-10-CM

## 2016-01-01 NOTE — Progress Notes (Signed)
Follow-up Note  RE: Mary Roth MRN: [id number] DOB: 10/13/1928 Date of Office Visit: 01/01/2016  Primary care provider: [name], MD Referring provider: Karleen Hampshire., MD  History of present illness: Mary Roth is a 82 y.o. female with history of allergic reactions, mild intermittent asthma, and allergic rhinitis presents today for follow up.  Her lab work revealed hypersensitivity to galactose-alpha-1,3-galactose (alpha-gal). She has had no further reactions since having eliminated mammalian meat from her diet.  She has access to epinephrine autoinjector 2 pack.  She has no nasal symptom complaints today but complains of side effects from cetirizine and diphenhydramine.   Assessment and plan: Food allergy, alpha-gal hypersensitivity  Continue meticulous avoidance of nonprimate mammalian meat and have access to epinephrine autoinjector 2 pack in case of accidental ingestion.  Emergency allergy action plan is in place.  We will plan to retest in one year.  Mild intermittent asthma  Continue albuterol HFA, 1-2 inhalations every 4-6 hours as needed.  Subjective and objective measures of pulmonary function will be followed and the treatment plan will be adjusted accordingly.  Perennial and seasonal allergic rhinitis  Continue aeroallergen avoidance measures and fluticasone nasal spray as needed.  Switch from cetirizine and diphenhydramine to fexofenadine 60 mg 1-2 times daily as needed.   Diagnositics: Spirometry:  Normal with an FEV1 of 102% predicted.  Please see scanned spirometry results for details.    Physical examination: Blood pressure 126/66, pulse 68, temperature 98.3 F (36.8 C), temperature source Oral, resp. rate 20.  General: Alert, interactive, in no acute distress. HEENT: TMs pearly gray, turbinates minimally edematous without discharge, post-pharynx mildly erythematous. Neck: Supple without lymphadenopathy. Lungs: Clear to auscultation without  wheezing, rhonchi or rales. CV: Normal S1, S2 without murmurs. Skin: Warm and dry, without lesions or rashes.  The following portions of the patient's history were reviewed and updated as appropriate: allergies, current medications, past family history, past medical history, past social history, past surgical history and problem list.    Medication List       Accurate as of 01/01/16  1:07 PM. Always use your most recent med list.          ADVAIR DISKUS 250-50 MCG/DOSE Aepb Generic drug:  Fluticasone-Salmeterol USE 1 PUFF BY MOUTH EVERY12 HOURS GARGLE ANDSPIT AFTER EACH USE   albuterol 108 (90 Base) MCG/ACT inhaler Commonly known as:  PROVENTIL HFA;VENTOLIN HFA Inhale into the lungs.   albuterol (2.5 MG/3ML) 0.083% nebulizer solution Commonly known as:  PROVENTIL Take 3 mLs (2.5 mg total) by nebulization every 4 (four) hours as needed for wheezing.   amLODipine 2.5 MG tablet Commonly known as:  NORVASC TAKE 1 TABLET BY MOUTH DAILY *PLEASE KEEP UPCOMING APPOINTMENT   cetirizine 10 MG chewable tablet Commonly known as:  ZYRTEC Chew 1 tablet (10 mg total) by mouth 2 (two) times daily.   EPINEPHrine 0.3 mg/0.3 mL Soaj injection Commonly known as:  EPIPEN 2-PAK Inject 0.3 mLs (0.3 mg total) into the muscle once.   fexofenadine 180 MG tablet Commonly known as:  ALLEGRA Take 180 mg by mouth daily as needed (asthma).   fluticasone 50 MCG/ACT nasal spray Commonly known as:  FLONASE Place 2 sprays into both nostrils daily as needed for allergies or rhinitis.   levothyroxine 88 MCG tablet Commonly known as:  SYNTHROID, LEVOTHROID Take 1 tablet (88 mcg total) by mouth daily. Must keep appt w/new provider for future refills   methylPREDNISolone 4 MG Tbpk tablet Commonly known as:  MEDROL DOSEPAK  follow package directions. PT. HAS ONE MORE DOSE.   montelukast 10 MG tablet Commonly known as:  SINGULAIR Take 10 mg by mouth as needed.   nebivolol 5 MG tablet Commonly known  as:  BYSTOLIC Take 1 tablet (5 mg total) by mouth daily.   nystatin-triamcinolone ointment Commonly known as:  MYCOLOG Apply topically.   rOPINIRole 0.25 MG tablet Commonly known as:  REQUIP Take 1 tablet (0.25 mg total) by mouth at bedtime. 1 pill 2 hrs pre bedtime   rosuvastatin 5 MG tablet Commonly known as:  CRESTOR   spironolactone 25 MG tablet Commonly known as:  ALDACTONE TAKE 1 TABLET BY MOUTH 2 TIMES DAILY.   triamcinolone ointment 0.1 % Commonly known as:  KENALOG Apply 1 application topically 2 (two) times daily.       Allergies  Allergen Reactions  . Beef Extract Anaphylaxis  . Peanut Oil Swelling  . Phenergan [Promethazine Hcl] Nausea And Vomiting    I appreciate the opportunity to take part in [facility] care. Please do not hesitate to contact me with questions.  Sincerely,   R. Edgar Frisk, MD

## 2016-01-01 NOTE — Assessment & Plan Note (Signed)
   Continue albuterol HFA, 1-2 inhalations every 4-6 hours as needed.  Subjective and objective measures of pulmonary function will be followed and the treatment plan will be adjusted accordingly. 

## 2016-01-01 NOTE — Assessment & Plan Note (Signed)
   Continue aeroallergen avoidance measures and fluticasone nasal spray as needed.  Switch from cetirizine and diphenhydramine to fexofenadine 60 mg 1-2 times daily as needed.

## 2016-01-01 NOTE — Patient Instructions (Signed)
Food allergy, alpha-gal hypersensitivity  Continue meticulous avoidance of nonprimate mammalian meat and have access to epinephrine autoinjector 2 pack in case of accidental ingestion.  Emergency allergy action plan is in place.  We will plan to retest in one year.  Mild intermittent asthma  Continue albuterol HFA, 1-2 inhalations every 4-6 hours as needed.  Subjective and objective measures of pulmonary function will be followed and the treatment plan will be adjusted accordingly.  Perennial and seasonal allergic rhinitis  Continue aeroallergen avoidance measures and fluticasone nasal spray as needed.  Switch from cetirizine and diphenhydramine to fexofenadine 60 mg 1-2 times daily as needed.   Return in about 6 months (around 07/03/2016), or if symptoms worsen or fail to improve.

## 2016-01-01 NOTE — Assessment & Plan Note (Signed)
   Continue meticulous avoidance of nonprimate mammalian meat and have access to epinephrine autoinjector 2 pack in case of accidental ingestion.  Emergency allergy action plan is in place.  We will plan to retest in one year.

## 2016-01-31 ENCOUNTER — Telehealth: Payer: Self-pay | Admitting: Allergy

## 2016-01-31 NOTE — Telephone Encounter (Signed)
Called Mickel Baas her daughter back and informed her to take her mother to the urgent care or to ER. Her mother sounded really hard of breathing when she called me this morning. Daughter said she would take her. Patient is using Advair and also albuterol HFA. Daughter said she was coughing a lot.Informed daughter to call me back Monday morning if mother was not any better. They are in [place].

## 2016-02-04 ENCOUNTER — Ambulatory Visit (INDEPENDENT_AMBULATORY_CARE_PROVIDER_SITE_OTHER): Payer: Medicare Other | Admitting: Pediatrics

## 2016-02-04 ENCOUNTER — Encounter: Payer: Self-pay | Admitting: Pediatrics

## 2016-02-04 VITALS — BP 130/74 | HR 78 | Temp 97.8°F | Resp 16 | Ht <= 58 in | Wt 178.0 lb

## 2016-02-04 DIAGNOSIS — J4541 Moderate persistent asthma with (acute) exacerbation: Secondary | ICD-10-CM | POA: Diagnosis not present

## 2016-02-04 DIAGNOSIS — J3089 Other allergic rhinitis: Secondary | ICD-10-CM

## 2016-02-04 DIAGNOSIS — T7800XD Anaphylactic reaction due to unspecified food, subsequent encounter: Secondary | ICD-10-CM

## 2016-02-04 DIAGNOSIS — J209 Acute bronchitis, unspecified: Secondary | ICD-10-CM | POA: Insufficient documentation

## 2016-02-04 MED ORDER — FLUTICASONE PROPIONATE 50 MCG/ACT NA SUSP: NASAL | 5 refills | Status: DC

## 2016-02-04 MED ORDER — MONTELUKAST SODIUM 10 MG PO TABS: 10.0000 mg | ORAL_TABLET | Freq: Every day | ORAL | 5 refills | Status: DC

## 2016-02-04 MED ORDER — FLUTICASONE-SALMETEROL 250-50 MCG/DOSE IN AEPB: 1.0000 | INHALATION_SPRAY | Freq: Two times a day (BID) | RESPIRATORY_TRACT | 5 refills | Status: DC

## 2016-02-04 MED ORDER — AZITHROMYCIN 250 MG PO TABS: ORAL_TABLET | ORAL | 0 refills | Status: DC

## 2016-02-04 NOTE — Patient Instructions (Addendum)
Allegra 180 mg-one tablet once a day for runny nose Fluticasone 2 sprays per nostril once a day if needed for stuffy nose Advair Diskus 250/50-one puff every 12 hours for coughing or wheezing Montelukast 10 mg once a day for coughing or wheezing Albuterol 0.083% one unit dose every 4 hours if needed for coughing or wheezing Add prednisone 20 mg twice a day for 3 days, 20 mg on the fourth day, 10 mg on the fifth day Zithromax 250 mg tablet-take 2 tablets today then 1 tablet once a day for the next 4 days Call us if you're not doing better on this treatment plan  Continue avoiding beef, lamb, pork and peanut. If you have an allergic reaction take Benadryl 50 mg every 4 hours and if you have life-threatening symptoms inject with EpiPen 0.3 mg

## 2016-02-04 NOTE — Progress Notes (Signed)
[place] [address] Dept: [phone]  FOLLOW UP NOTE  Patient ID: Mary Roth, female    DOB: 1929-02-06  Age: 82 y.o. MRN: [id number] Date of Office Visit: 02/04/2016  Assessment  Chief Complaint: Cough (x's 3 weeks ) and Wheezing (x's 3 weeks)  HPI Mary Roth presents for evaluation of a cough for 3 weeks. She is bringing up a discolored mucus.. She has not had a fever. She has not had nasal congestion or postnasal drainage. She avoids beef, lamb, pork and peanut  Current medications are Allegra 180 mg once a day, albuterol 0.083% one unit dose every 4 hours if needed or instead Pro-air 2 puffs every 4 hours if needed, fluticasone 2 sprays per nostril once a day,if  needed, montelukast 10 mg once a day. Her other medications are outlined in the chart.   Drug Allergies:  Allergies  Allergen Reactions  . Beef Extract Anaphylaxis  . Lambs Quarters Anaphylaxis  . Peanut Oil Swelling  . Pork-Derived Products Anaphylaxis  . Phenergan [Promethazine Hcl] Nausea And Vomiting    Physical Exam: BP 130/74 (BP Location: Left Arm, Patient Position: Sitting, Cuff Size: Large)   Pulse 78   Temp 97.8 F (36.6 C) (Oral)   Resp 16   Ht 4\' 10"  (1.473 m)   Wt 178 lb (80.7 kg)   SpO2 97%   BMI 37.20 kg/m    Physical Exam  Constitutional: She is oriented to person, place, and time. She appears well-developed and well-nourished.  HENT:  Eyes normal. Ears normal. Nose normal. Pharynx normal.  Neck: Neck supple.  Cardiovascular:  S1 and S2 normal no murmurs  Pulmonary/Chest:  Clear to percussion auscultation except for some rhonchi  and wheezing in both lungs  Lymphadenopathy:    She has no cervical adenopathy.  Neurological: She is alert and oriented to person, place, and time.  Psychiatric: She has a normal mood and affect. Her behavior is normal. Judgment and thought content normal.  Vitals reviewed.   Diagnostics:  FVC 1.55 L FEV1 1.19 L. Predicted  FVC 1.67 L predicted FEV1 1.21 L. After a DuoNeb FVC 1.49 L FEV1 1.10 L-the spirometry is in the normal range and there was no significant improvement after DuoNeb. She had a pulse ox of 97%. She felt better after the DuoNeb  Assessment and Plan: 1. Moderate persistent asthma, with acute exacerbation   2. Acute bronchitis, unspecified organism   3. Allergy with anaphylaxis due to food, subsequent encounter   4. Other allergic rhinitis     Meds ordered this encounter  Medications  . Fluticasone-Salmeterol (ADVAIR DISKUS) 250-50 MCG/DOSE AEPB    Sig: Inhale 1 puff into the lungs 2 (two) times daily.    Dispense:  60 each    Refill:  5    To prevent cough or wheeze  . fluticasone (FLONASE) 50 MCG/ACT nasal spray    Sig: 2 sprays per nostril once a day if needed    Dispense:  16 g    Refill:  5    For stuffy nose  . montelukast (SINGULAIR) 10 MG tablet    Sig: Take 1 tablet (10 mg total) by mouth at bedtime.    Dispense:  30 tablet    Refill:  5    For cough or wheeze  . azithromycin (ZITHROMAX) 250 MG tablet    Sig: Take 2 tablets by mouth today then 1 tablet once a day for the next 4 days.  Dispense:  6 each    Refill:  0    For infection    Patient Instructions  Allegra 180 mg-one tablet once a day for runny nose Fluticasone 2 sprays per nostril once a day if needed for stuffy nose Advair Diskus 250/50-one puff every 12 hours for coughing or wheezing Montelukast 10 mg once a day for coughing or wheezing Albuterol 0.083% one unit dose every 4 hours if needed for coughing or wheezing Add prednisone 20 mg twice a day for 3 days, 20 mg on the fourth day, 10 mg on the fifth day Zithromax 250 mg tablet-take 2 tablets today then 1 tablet once a day for the next 4 days Call us if you're not doing better on this treatment plan  Continue avoiding beef, lamb, pork and peanut. If you have an allergic reaction take Benadryl 50 mg every 4 hours and if you have life-threatening symptoms  inject with EpiPen 0.3 mg   Return in about 6 weeks (around 03/17/2016).    Thank you for the opportunity to care for this patient.  Please do not hesitate to contact me with questions.  Penne Lash, M.D.  [facility] [address] [place], [place] [address] [phone]

## 2016-03-03 ENCOUNTER — Other Ambulatory Visit: Payer: Self-pay | Admitting: Internal Medicine

## 2016-03-03 DIAGNOSIS — Z1231 Encounter for screening mammogram for malignant neoplasm of breast: Secondary | ICD-10-CM

## 2016-03-18 ENCOUNTER — Other Ambulatory Visit: Payer: Self-pay | Admitting: Cardiovascular Disease

## 2016-03-18 NOTE — Telephone Encounter (Signed)
Rx request sent to pharmacy.  

## 2016-03-19 ENCOUNTER — Ambulatory Visit: Payer: Medicare Other | Admitting: Allergy and Immunology

## 2016-03-21 ENCOUNTER — Ambulatory Visit
Admission: RE | Admit: 2016-03-21 | Discharge: 2016-03-21 | Disposition: A | Discharge status: Home / Self Care | Payer: Medicare Other | Source: Ambulatory Visit | Attending: Internal Medicine | Admitting: Internal Medicine

## 2016-03-21 DIAGNOSIS — Z1231 Encounter for screening mammogram for malignant neoplasm of breast: Secondary | ICD-10-CM

## 2016-03-25 ENCOUNTER — Other Ambulatory Visit: Payer: Self-pay | Admitting: Cardiovascular Disease

## 2016-03-25 NOTE — Telephone Encounter (Signed)
REFILL 

## 2016-05-08 ENCOUNTER — Other Ambulatory Visit: Payer: Self-pay | Admitting: Cardiovascular Disease

## 2016-05-08 NOTE — Telephone Encounter (Signed)
Rx has been sent to the pharmacy electronically. ° °

## 2016-05-11 ENCOUNTER — Other Ambulatory Visit: Payer: Self-pay | Admitting: Allergy

## 2016-05-11 DIAGNOSIS — J4520 Mild intermittent asthma, uncomplicated: Secondary | ICD-10-CM

## 2016-05-11 MED ORDER — ALBUTEROL SULFATE (2.5 MG/3ML) 0.083% IN NEBU: 2.5000 mg | INHALATION_SOLUTION | RESPIRATORY_TRACT | 3 refills | Status: DC | PRN

## 2016-05-11 NOTE — Progress Notes (Signed)
Called by patient as she only had 3 albuterol vials left and requested refill.  Sent prescription to [facility].

## 2016-05-14 ENCOUNTER — Encounter: Payer: Self-pay | Admitting: Nurse Practitioner

## 2016-05-14 ENCOUNTER — Ambulatory Visit (INDEPENDENT_AMBULATORY_CARE_PROVIDER_SITE_OTHER): Payer: Medicare Other | Admitting: Nurse Practitioner

## 2016-05-14 VITALS — BP 122/66 | HR 72 | Ht <= 58 in | Wt 193.0 lb

## 2016-05-14 DIAGNOSIS — R079 Chest pain, unspecified: Secondary | ICD-10-CM | POA: Diagnosis not present

## 2016-05-14 DIAGNOSIS — J4551 Severe persistent asthma with (acute) exacerbation: Secondary | ICD-10-CM | POA: Diagnosis not present

## 2016-05-14 DIAGNOSIS — I10 Essential (primary) hypertension: Secondary | ICD-10-CM | POA: Diagnosis not present

## 2016-05-14 MED ORDER — ROSUVASTATIN CALCIUM 5 MG PO TABS: 5.0000 mg | ORAL_TABLET | Freq: Every day | ORAL | 11 refills | Status: DC

## 2016-05-14 MED ORDER — SPIRONOLACTONE 25 MG PO TABS: 25.0000 mg | ORAL_TABLET | Freq: Two times a day (BID) | ORAL | 0 refills | Status: DC

## 2016-05-14 MED ORDER — PREDNISONE 10 MG (21) PO TBPK: 10.0000 mg | ORAL_TABLET | Freq: Every day | ORAL | 0 refills | Status: DC

## 2016-05-14 MED ORDER — AMLODIPINE BESYLATE 2.5 MG PO TABS: 2.5000 mg | ORAL_TABLET | Freq: Every day | ORAL | 11 refills | Status: DC

## 2016-05-14 MED ORDER — NEBIVOLOL HCL 5 MG PO TABS: 5.0000 mg | ORAL_TABLET | Freq: Every day | ORAL | 11 refills | Status: DC

## 2016-05-14 NOTE — Progress Notes (Signed)
Office Visit    Patient Name: Mary Roth Date of Encounter: 05/14/2016  Primary Care Provider:  [name], MD Primary Cardiologist:  Corky Downs, MD   Chief Complaint    83 year old female with a prior history of asthma, chronic dyspnea, chest pain, and hypertension, who presents for follow-up.  Past Medical History    Past Medical History:  Diagnosis Date  . Abnormal liver function test   . Arthritis   . Chest pain    a. 06/2013 Lexi MV: no ischemia/infarct.  . Esophageal reflux   . Hepatic steatosis   . Mastodynia   . Osteopenia 03/2012   T score -1.4 FRAX 20%/11%; Dr Phineas Real  . Other and unspecified hyperlipidemia   . Personal history of colonic polyps 08/21/2006   ADENOMATOUS POLYP  . Progressive Dyspnea    a. 06/2013 Echo: EF 55-60%, Gr1 DD;  b. 03/2014 CPX testing: indeterminate cv response due to suboptimal cv stress load and blunted chronotropic response. Nl pulm response, nl PFTs.  [name] Unspecified asthma(493.90) 2003    no childhood asthma  . Unspecified essential hypertension   . Unspecified hypothyroidism    Past Surgical History:  Procedure Laterality Date  . APPENDECTOMY    . CHOLECYSTECTOMY  1973    for stones  . TONSILLECTOMY    . UTERINE SUSPENSION  1968   [place] , [place]    Allergies  Allergies  Allergen Reactions  . Beef Extract Anaphylaxis  . Lambs Quarters Anaphylaxis  . Peanut Oil Swelling  . Pork-Derived Products Anaphylaxis  . Phenergan [Promethazine Hcl] Nausea And Vomiting    History of Present Illness    83 year old female with the above complex past medical history. She has a long history of asthma and uses multiple inhalers. She also has a history of chest pain with negative Myoview in 2015. In the setting of asthma, she has chronic dyspnea. She's been evaluated with prior echo which showed normal LV function and also CPX testing which showed normal PFTs and indeterminate cardiac response. She is followed by Dr. Claiborne Billings annually and  needs refills on her cardiac medications. Since her visit last year, she was admitted to [facility] in August of this year with chest pain. She ruled out and underwent stress testing which was low risk. Echo showed normal LV function. Since then, she has done reasonably well but about 2 weeks ago, she started having worsening of asthma. Her inhalers were changed up and she has had some improvement though continues to have wheezing. She says that in the past, she has always required prednisone taper to overcome an asthma flare. She has not been having any PND, orthopnea, dizziness, syncope, edema, or early satiety. Of note, in the setting of asthma flares, she will sometimes experience chest tightness which she is accustomed to and this has occurred with asthma flares for many many years. Chest tightness during asthma flares are very different and not nearly as severe as what she experienced prior to admission in [facility] in August.  Home Medications    Prior to Admission medications   Medication Sig Start Date End Date Taking? Authorizing Provider  albuterol (PROAIR HFA) 108 (90 Base) MCG/ACT inhaler Inhale into the lungs every 6 (six) hours as needed for wheezing or shortness of breath.   Yes Historical Provider, MD  albuterol (PROVENTIL HFA;VENTOLIN HFA) 108 (90 Base) MCG/ACT inhaler Inhale into the lungs.   Yes Historical Provider, MD  albuterol (PROVENTIL) (2.5 MG/3ML) 0.083% nebulizer solution Take  3 mLs (2.5 mg total) by nebulization every 4 (four) hours as needed for wheezing. 05/11/16  Yes Shaylar Charmian Muff, MD  amLODipine (NORVASC) 2.5 MG tablet TAKE 1 TABLET BY MOUTH DAILY *PLEASE KEEP UPCOMING APPOINTMENT 05/08/16  Yes Troy Sine, MD  BYSTOLIC 5 MG tablet TAKE 1 TABLET (5 MG TOTAL) BY MOUTH DAILY. 03/25/16  Yes Troy Sine, MD  cetirizine (ZYRTEC ALLERGY) 10 MG tablet Take 10 mg by mouth daily.   Yes Historical Provider, MD  EPINEPHrine (EPIPEN 2-PAK) 0.3 mg/0.3 mL IJ  SOAJ injection Inject 0.3 mLs (0.3 mg total) into the muscle once. 11/07/15  Yes Adelina Mings, MD  fexofenadine (ALLEGRA) 180 MG tablet Take 180 mg by mouth daily as needed (asthma).    Yes Historical Provider, MD  fluticasone (FLONASE) 50 MCG/ACT nasal spray 2 sprays per nostril once a day if needed 02/04/16  Yes Charlies Silvers, MD  Fluticasone-Salmeterol (ADVAIR DISKUS) 250-50 MCG/DOSE AEPB Inhale 1 puff into the lungs 2 (two) times daily. 02/04/16  Yes Charlies Silvers, MD  levothyroxine (SYNTHROID, LEVOTHROID) 88 MCG tablet Take 1 tablet (88 mcg total) by mouth daily. Must keep appt w/new provider for future refills 06/19/15  Yes Hendricks Limes, MD  methylPREDNISolone (MEDROL DOSEPAK) 4 MG TBPK tablet follow package directions. PT. HAS ONE MORE DOSE. 12/01/15  Yes Historical Provider, MD  montelukast (SINGULAIR) 10 MG tablet Take 1 tablet (10 mg total) by mouth at bedtime. 02/04/16  Yes Charlies Silvers, MD  nystatin-triamcinolone ointment (MYCOLOG) Apply topically.   Yes Historical Provider, MD  rOPINIRole (REQUIP) 0.25 MG tablet Take 1 tablet (0.25 mg total) by mouth at bedtime. 1 pill 2 hrs pre bedtime 05/01/14  Yes Hendricks Limes, MD  rosuvastatin (CRESTOR) 5 MG tablet  10/09/15  Yes Historical Provider, MD  spironolactone (ALDACTONE) 25 MG tablet Take 1 tablet (25 mg total) by mouth 2 (two) times daily. Please schedule appointment for refills. 03/18/16  Yes Troy Sine, MD    Review of Systems    She has been having increasing dyspnea and wheezing in the setting of an acute asthma flare. Sometimes with asthma flare, she experiences tightness in her chest. She denies PND, orthopnea, palpitations, dizziness, syncope, edema, or early satiety.  All other systems reviewed and are otherwise negative except as noted above.  Physical Exam    VS:  BP 122/66   Pulse 72   Ht 4\' 10"  (1.473 m)   Wt 193 lb (87.5 kg)   BMI 40.34 kg/m  , BMI Body mass index is 40.34 kg/m. GEN: Well nourished,  well developed, in no acute distress.  HEENT: normal.  Neck: Supple, no JVD, carotid bruits, or masses. Cardiac: RRR, no murmurs, rubs, or gallops. No clubbing, cyanosis, edema.  Radials/DP/PT 2+ and equal bilaterally.  Respiratory:  Respirations regular and unlabored, scattered rhonchi with expiratory wheezing bilaterally and more pronounced anteriorly. GI: Soft, nontender, nondistended, BS + x 4. MS: no deformity or atrophy. Skin: warm and dry, no rash. Neuro:  Strength and sensation are intact. Psych: Normal affect.  Accessory Clinical Findings    ECG - RSR, 72, 1st deg avb, delayed R progression.  Assessment & Plan    1.  Intermittent chest pain: The setting of asthma flares, Ms. Sweda has intermittent  chest tightness but dates back many years. She had worsening chest pain in August and was seen at [facility]. She ruled out and had normal stress testing. Echo was also normal.  I was able to review both of the results of these studies in care everywhere today. No additional testing required at this time.   2. Severe persistent asthma with acute exacerbation: Over the past 3 weeks, she has had increasing dyspnea and wheezing. She's been using inhalers 4 times a day with some improvement though she continues to have wheezing on exam with scattered rhonchi as well. In the past, she is always required a steroid taper prior to resolution of symptoms. Given ongoing wheezing, I have prescribed prednisone 10 mg to be tapered over the next 5 days (50, 40, 30, 20, 10).  3. Essential hypertension: Stable on amlodipine, bystolic, and spiro.  Labs were nl in August when seen @ [facility].  I will refill her meds today.  4.  HL:  LDL in August was 85.  She remains on crestor therapy.  5.  Dispo: Follow-up with Dr. Claiborne Billings in 1 year.  Murray Hodgkins, NP 05/14/2016, 3:27 PM

## 2016-05-14 NOTE — Patient Instructions (Signed)
[name], NP has recommended making the following medication changes: 1. START Prednisone 10 mg - following the dosing schedule below:  Take 5 tablets TODAY (05/14/16)  Take 4 tablets tomorrow (05/15/16)  Take 3 tablets Friday (05/16/16)  Take 2 tablets Saturday (05/17/16)  Take 1 tablet Sunday (05/18/16)  Your physician recommends that you schedule a follow-up appointment in 1 year with Dr Claiborne Billings. You will receive a reminder letter in the mail two months in advance. If you don't receive a letter, please call our office to schedule the follow-up appointment.  If you need a refill on your cardiac medications before your next appointment, please call your pharmacy.

## 2016-05-15 ENCOUNTER — Other Ambulatory Visit: Payer: Self-pay | Admitting: Cardiovascular Disease

## 2016-05-30 DIAGNOSIS — N83201 Unspecified ovarian cyst, right side: Secondary | ICD-10-CM | POA: Insufficient documentation

## 2016-06-10 ENCOUNTER — Telehealth: Payer: Self-pay | Admitting: Cardiovascular Disease

## 2016-06-10 NOTE — Telephone Encounter (Signed)
Spoke w Cyril Mourning w [facility] calling on behalf of Dr. Rozetta Nunnery. Pt evaluated yesterday at [facility] for wheezing, SOB, also new finding of irreg HB (sinus rhythm w freq PACs/PVCs). Dr. Rozetta Nunnery was concerned for findings and thought she needed to be directly admitted to hospital. Patient refused and was then recommended to follow up with our office ASAP.  Cyril Mourning calling this AM to see if patient can be worked in for cardiology evaluation.

## 2016-06-10 NOTE — Telephone Encounter (Signed)
Called and spoke to patient. Patient notes she's getting over pneumonia, still having trouble breathing. Gets "real real hot", then cools off and has chills. Her main concern is trying to breathe. She gets SOB primarily on exertion. She finished her antibiotics and steroids. she notes she has bronchodilators for use but doesn't help w this shortness of breath. She insists that her symptoms don't warrant a hospital evaluation, but she's agreed that she will go to ED if her symptoms are any worse. Aware I will get recommendations from provider for evaluation in office.

## 2016-06-10 NOTE — Telephone Encounter (Addendum)
Spoke w Dr. Claiborne Billings, he reviewed chart and findings from yesterday's OV, expressed that it would be appropriate to have patient followed in clinic this week by APP but did not see need for admission to hospital based on findings. Dr. Claiborne Billings aware of and agreed w my instructions to have patient go to ED if worse. Physician recommended APP visit within next week for eval of HR concerns. I called patient back and discussed advice, scheduled her for an appt w [place] on Friday at 3pm. Reinforced instruction to seek guidance regarding pneumonia symptoms from PCP office or ED as appropriate. Patient notes "I think it's getting better" but agreed to seek treatment should her symptoms fail to improve or worsen.

## 2016-06-10 NOTE — Telephone Encounter (Signed)
Pt has SOB, WHEEZING, IRREG HEARTBEAT-THEY WANT HER SEEN TODAY-

## 2016-06-10 NOTE — Telephone Encounter (Signed)
Unable to get back in touch w PCP office at number provided. Routed as FYI to APP.

## 2016-06-12 NOTE — Progress Notes (Signed)
Cardiology Office Note    Date:  06/13/2016   ID:  Mary Roth, DOB [birth year], MRN [id number]  PCP:  Mary Mango, MD  Cardiologist: Dr. Claiborne Billings   Chief Complaint  Patient presents with  . Shortness of Breath    History of Present Illness:    Mary Roth is a 83 y.o. female with past medical history of asthma, chronic dyspnea, HTN, and atypical chest pain (low-risk NST in 2015 and 12/2015) who presents to the office today for follow-up.   Was last seen by Mary Bayley, NP on 05/14/2016 and reported worsening dyspnea for the past 2 weeks. Reported having episodes of intermittent chest tightness in the setting of asthma exacerbations, and with her recent normal NST and echo, no further ischemic evaluation was pursued. She was prescribed a 5-day prednisone taper for her recent exacerbation.   Triage was contacted on 1/30 by the patient's PCP for an irregular heartbeat during a visit for wheezing and dyspnea. An EKG was obtained and showed NSR with frequent PAC's and PVC's.   In talking with the patient today, she reports dyspnea with exertion for the past several months, but over the past 4 weeks this has significantly worsened. She notices this the most when ambulating from her apartment to the cafeteria, she reports this is a quarter-mile walk. Denies any associated chest discomfort, palpitations, orthopnea, or lower extremity edema.  Was seen by her PCP earlier this week as above and reports mild improvement in her symptoms since then. Says ambulatory oxygen saturations were checked at that time that she is unsure what these values were. Reports being told her TSH was within normal limits in regards to her reported hot flashes.   Past Medical History:  Diagnosis Date  . Abnormal liver function test   . Arthritis   . Chest pain    a. 06/2013 Lexi MV: no ischemia/infarct;  b. 12/2015 MV ([place]): EF 82%, no ischemia.  . Esophageal reflux   . Hepatic steatosis   . Mastodynia   .  Osteopenia 03/2012   T score -1.4 FRAX 20%/11%; Dr Phineas Real  . Other and unspecified hyperlipidemia   . Personal history of colonic polyps 08/21/2006   ADENOMATOUS POLYP  . Progressive Dyspnea    a. 06/2013 Echo: EF 55-60%, Gr1 DD;  b. 03/2014 CPX testing: indeterminate cv response due to suboptimal cv stress load and blunted chronotropic response. Nl pulm response, nl PFTs;  c. 12/2015 Echo [name]): EF 55-60%, no significant valve dzs.  [name] Unspecified asthma(493.90) 2003    no childhood asthma  . Unspecified essential hypertension   . Unspecified hypothyroidism     Past Surgical History:  Procedure Laterality Date  . APPENDECTOMY    . CHOLECYSTECTOMY  1973    for stones  . TONSILLECTOMY    . UTERINE SUSPENSION  [address] , [place]    Current Medications: Outpatient Medications Prior to Visit  Medication Sig Dispense Refill  . albuterol (PROAIR HFA) 108 (90 Base) MCG/ACT inhaler Inhale into the lungs every 6 (six) hours as needed for wheezing or shortness of breath.    [name] albuterol (PROVENTIL) (2.5 MG/3ML) 0.083% nebulizer solution Take 3 mLs (2.5 mg total) by nebulization every 4 (four) hours as needed for wheezing. 90 mL 3  . amLODipine (NORVASC) 2.5 MG tablet Take 1 tablet (2.5 mg total) by mouth daily. 30 tablet 11  . cetirizine (ZYRTEC ALLERGY) 10 MG tablet Take 10 mg by mouth daily.    [name]  EPINEPHrine (EPIPEN 2-PAK) 0.3 mg/0.3 mL IJ SOAJ injection Inject 0.3 mLs (0.3 mg total) into the muscle once. 2 Device 1  . fexofenadine (ALLEGRA) 180 MG tablet Take 180 mg by mouth daily as needed (asthma).     . fluticasone (FLONASE) 50 MCG/ACT nasal spray 2 sprays per nostril once a day if needed 16 g 5  . Fluticasone-Salmeterol (ADVAIR DISKUS) 250-50 MCG/DOSE AEPB Inhale 1 puff into the lungs 2 (two) times daily. 60 each 5  . levothyroxine (SYNTHROID, LEVOTHROID) 88 MCG tablet Take 1 tablet (88 mcg total) by mouth daily. Must keep appt w/new provider for future refills 30 tablet 0  .  methylPREDNISolone (MEDROL DOSEPAK) 4 MG TBPK tablet follow package directions. PT. HAS ONE MORE DOSE.    . montelukast (SINGULAIR) 10 MG tablet Take 1 tablet (10 mg total) by mouth at bedtime. 30 tablet 5  . nebivolol (BYSTOLIC) 5 MG tablet Take 1 tablet (5 mg total) by mouth daily. 30 tablet 11  . nystatin-triamcinolone ointment (MYCOLOG) Apply topically.    . predniSONE (STERAPRED UNI-PAK 21 TAB) 10 MG (21) TBPK tablet Take 1 tablet (10 mg total) by mouth daily. Take 1-5 tablets by mouth daily as directed. 15 tablet 0  . rOPINIRole (REQUIP) 0.25 MG tablet Take 1 tablet (0.25 mg total) by mouth at bedtime. 1 pill 2 hrs pre bedtime 30 tablet 2  . rosuvastatin (CRESTOR) 5 MG tablet Take 1 tablet (5 mg total) by mouth at bedtime. 30 tablet 11  . spironolactone (ALDACTONE) 25 MG tablet Take 1 tablet (25 mg total) by mouth 2 (two) times daily. Please schedule appointment for refills. 60 tablet 0  . albuterol (PROVENTIL HFA;VENTOLIN HFA) 108 (90 Base) MCG/ACT inhaler Inhale into the lungs.     No facility-administered medications prior to visit.      Allergies:   Beef extract; Lambs quarters; Peanut oil; Pork-derived products; and Phenergan [promethazine hcl]   Social History   Social History  . Marital status: Married    Spouse name: N/A  . Number of children: 2  . Years of education: N/A   Occupational History  . homemaker Retired   Social History Main Topics  . Smoking status: Never Smoker  . Smokeless tobacco: Never Used  . Alcohol use 2.4 oz/week    4 Shots of liquor per week     Comment:  socially  . Drug use: No  . Sexual activity: No   Other Topics Concern  . None   Social History Narrative  . None     Family History:  The patient's family history includes Alcohol abuse in her brother; Asthma in her sister; Breast cancer in her sister; Eczema in her mother; Food Allergy in her sister; Heart attack (age of onset: 17) in her father; Ovarian cancer in her mother; Pancreatic  cancer in her brother; Prostate cancer in her brother and father; Uterine cancer in her mother.   Review of Systems:   Please see the history of present illness.     General:  No chills, fever, night sweats or weight changes. Positive for hot flashes.  Cardiovascular:  No chest pain, edema, orthopnea, palpitations, paroxysmal nocturnal dyspnea. Positive for dyspnea on exertion.  Dermatological: No rash, lesions/masses Respiratory: No cough, dyspnea Urologic: No hematuria, dysuria Abdominal:   No nausea, vomiting, diarrhea, bright red blood per rectum, melena, or hematemesis Neurologic:  No visual changes, wkns, changes in mental status. All other systems reviewed and are otherwise negative except as noted above.  Physical Exam:    VS:  BP 139/73   Pulse 69   Ht 4\' 10"  ([id number] m)   Wt 189 lb 6.4 oz (85.9 kg)   BMI 39.58 kg/m    General: Well developed, well nourished Caucasian female appearing in no acute distress. Head: Normocephalic, atraumatic, sclera non-icteric, no xanthomas, nares are without discharge.  Neck: No carotid bruits. JVD not elevated.  Lungs: Respirations regular and unlabored, with mild expiratory wheezing but no rales.   Heart: Regular rate and rhythm. No S3 or S4.  No murmur, no rubs, or gallops appreciated. Abdomen: Soft, non-tender, non-distended with normoactive bowel sounds. No hepatomegaly. No rebound/guarding. No obvious abdominal masses. Msk:  Strength and tone appear normal for age. No joint deformities or effusions. Extremities: No clubbing or cyanosis. No edema.  Distal pedal pulses are 2+ bilaterally. Neuro: Alert and oriented X 3. Moves all extremities spontaneously. No focal deficits noted. Psych:  Responds to questions appropriately with a normal affect. Skin: No rashes or lesions noted  Wt Readings from Last 3 Encounters:  06/13/16 189 lb 6.4 oz (85.9 kg)  05/14/16 193 lb (87.5 kg)  02/04/16 178 lb (80.7 kg)     Studies/Labs Reviewed:    EKG:  EKG is ordered today.  The ekg ordered today demonstrates NSR, HR 69, with occasional PVC's.   Recent Labs: 12/07/2015: ALT 23; BUN 23; Creatinine, Ser 0.89; Platelets 240; Potassium 4.1; Sodium 140   Lipid Panel    Component Value Date/Time   CHOL 232 (H) 05/17/2014 0834   TRIG 133.0 05/17/2014 0834   HDL 64.80 05/17/2014 0834   CHOLHDL 4 05/17/2014 0834   VLDL 26.6 05/17/2014 0834   LDLCALC 141 (H) 05/17/2014 0834   LDLDIRECT 106.8 01/10/2011 1218    Additional studies/ records that were reviewed today include:   NST: 12/2015 1. No reversible ischemia or infarction.  2. Normal left ventricular wall motion.  3. Left ventricular ejection fraction 82%  4. Non invasive risk stratification*: Low  Echocardiogram: 12/2015 Findings Mitral Valve Structurally normal mitral valve with good mobility and no significant regurgitation. Aortic Valve The aortic valve leaflets were not well visualized. No aortic stenosis. No aortic regurgitation. Tricuspid Valve Structurally normal tricuspid valve with no stenosis. Pulmonic Valve The pulmonic valve was not well visualized No Doppler evidence of pulmonic stenosis or insufficiency. Left Atrium Normal left atrium. Left Ventricle The left ventricle was not well visualized, but overall grossly normal size and function. Ejection fraction is visually estimated at 55-60% Right Atrium Normal right atrium. Right Ventricle Normal right ventricle structure and function. Pericardial Effusion No evidence of pericardial effusion. Miscellaneous The aorta is within normal limits. The IVC is normal  Assessment:    1. DOE (dyspnea on exertion)   2. Severe persistent asthma with acute exacerbation   3. PVC (premature ventricular contraction)   4. Essential hypertension   5. Hyperlipidemia, unspecified hyperlipidemia type      Plan:   In order of problems listed above:  1. Dyspnea on Exertion/ Asthma - She reports  persistent dyspnea with exertion for the past several months which has acutely worsened in the past 4 weeks. Denies any associated chest discomfort, palpitations, orthopnea,, PND or lower extremity edema. - Recent NST in 12/2015 was low risk with no evidence of ischemia and her echocardiogram showed normal pumping function with no significant valve abnormalities. - Ambulatory oxygen saturations checked in the office today with O2 remaining greater than 96% and heart rate stable in the 70s to  90s. - She does not appear volume overloaded on examination but does have mild wheezing. Will check BNP today. If elevated, start low-dose Lasix 20mg  daily. Otherwise, pursue non-cardiac causes of her dyspnea.   2. PVC's - recent EKG reviewed today and showed NSR with occasional PAC's and PVC's. Today's EKG showed NSR with PVC's as well.  - denies any recent chest discomfort or palpitations.  - will check K+ today as she is on Spironolactone   3. HTN - BP well-controlled at 139/73 during today's visit.  - continue Amlodipine, Bystolic, and Spironolactone.   4. HLD - LDL at 85 when last checked on 12/2015. - continue Crestor 5mg  daily.    Medication Adjustments/Labs and Tests Ordered: Current medicines are reviewed at length with the patient today.  Concerns regarding medicines are outlined above.  Medication changes, Labs and Tests ordered today are listed in the Patient Instructions below. Patient Instructions  Medication Instructions:  Your physician recommends that you continue on your current medications as directed. Please refer to the Current Medication list given to you today.  Labwork: Have lab work completed today (BNP, BMET)  Testing/Procedures: NONE  Follow-Up: Your physician wants you to follow-up in: [place]. You will receive a reminder letter in the mail two months in advance. If you don't receive a letter, please call our office to schedule the follow-up  appointment.   Any Other Special Instructions Will Be Listed Below (If Applicable).  If you need a refill on your cardiac medications before your next appointment, please call your pharmacy.   Signed, Erma Heritage, PA  06/13/2016 4:40 PM    [facility] [place], [place], [place]  [address] Phone: [phone]; Fax: [phone]  [address] [place], [place] [address] Phone: [phone]

## 2016-06-13 ENCOUNTER — Encounter: Payer: Self-pay | Admitting: Student

## 2016-06-13 ENCOUNTER — Ambulatory Visit (INDEPENDENT_AMBULATORY_CARE_PROVIDER_SITE_OTHER): Payer: Medicare Other | Admitting: Student

## 2016-06-13 VITALS — BP 139/73 | HR 69 | Ht <= 58 in | Wt 189.4 lb

## 2016-06-13 DIAGNOSIS — I10 Essential (primary) hypertension: Secondary | ICD-10-CM

## 2016-06-13 DIAGNOSIS — I493 Ventricular premature depolarization: Secondary | ICD-10-CM | POA: Diagnosis not present

## 2016-06-13 DIAGNOSIS — E785 Hyperlipidemia, unspecified: Secondary | ICD-10-CM

## 2016-06-13 DIAGNOSIS — R0609 Other forms of dyspnea: Secondary | ICD-10-CM

## 2016-06-13 DIAGNOSIS — J4551 Severe persistent asthma with (acute) exacerbation: Secondary | ICD-10-CM | POA: Diagnosis not present

## 2016-06-13 LAB — BASIC METABOLIC PANEL
BUN: 24 mg/dL (ref 7–25)
CALCIUM: 9.2 mg/dL (ref 8.6–10.4)
CHLORIDE: 104 mmol/L (ref 98–110)
CO2: 27 mmol/L (ref 20–31)
CREATININE: 0.9 mg/dL — AB (ref 0.60–0.88)
Glucose, Bld: 107 mg/dL — ABNORMAL HIGH (ref 65–99)
Potassium: 4.9 mmol/L (ref 3.5–5.3)
SODIUM: 139 mmol/L (ref 135–146)

## 2016-06-13 NOTE — Patient Instructions (Signed)
Medication Instructions:  Your physician recommends that you continue on your current medications as directed. Please refer to the Current Medication list given to you today.  Labwork: Have lab work completed today (BNP, BMET)  Testing/Procedures: NONE  Follow-Up: Your physician wants you to follow-up in: [place]. You will receive a reminder letter in the mail two months in advance. If you don't receive a letter, please call our office to schedule the follow-up appointment.   Any Other Special Instructions Will Be Listed Below (If Applicable).     If you need a refill on your cardiac medications before your next appointment, please call your pharmacy.

## 2016-06-14 LAB — BRAIN NATRIURETIC PEPTIDE: Brain Natriuretic Peptide: 49.5 pg/mL (ref <100)

## 2016-06-16 ENCOUNTER — Other Ambulatory Visit: Payer: Self-pay | Admitting: Allergy

## 2016-06-16 MED ORDER — OSELTAMIVIR PHOSPHATE 75 MG PO CAPS: ORAL_CAPSULE | ORAL | 0 refills | Status: DC

## 2016-06-16 NOTE — Telephone Encounter (Signed)
Patient called said her husband was in hospital with flu.Wanted to know if Dr. Shaune Leeks would call in tamiful for her? Called in per Dr. Shaune Leeks.

## 2016-06-18 ENCOUNTER — Other Ambulatory Visit: Payer: Self-pay | Admitting: Allergy

## 2016-06-18 DIAGNOSIS — J4520 Mild intermittent asthma, uncomplicated: Secondary | ICD-10-CM

## 2016-06-18 MED ORDER — ALBUTEROL SULFATE (2.5 MG/3ML) 0.083% IN NEBU: 2.5000 mg | INHALATION_SOLUTION | RESPIRATORY_TRACT | 3 refills | Status: DC | PRN

## 2016-07-10 ENCOUNTER — Other Ambulatory Visit: Payer: Self-pay | Admitting: Nurse Practitioner

## 2016-07-11 NOTE — Telephone Encounter (Signed)
Rx(s) sent to pharmacy electronically.  

## 2016-09-03 ENCOUNTER — Ambulatory Visit (INDEPENDENT_AMBULATORY_CARE_PROVIDER_SITE_OTHER): Payer: Medicare Other | Admitting: Allergy and Immunology

## 2016-09-03 ENCOUNTER — Encounter: Payer: Self-pay | Admitting: Allergy and Immunology

## 2016-09-03 VITALS — BP 150/84 | HR 72 | Temp 98.0°F | Resp 12

## 2016-09-03 DIAGNOSIS — R05 Cough: Secondary | ICD-10-CM

## 2016-09-03 DIAGNOSIS — R053 Chronic cough: Secondary | ICD-10-CM

## 2016-09-03 DIAGNOSIS — J3089 Other allergic rhinitis: Secondary | ICD-10-CM | POA: Diagnosis not present

## 2016-09-03 DIAGNOSIS — J45901 Unspecified asthma with (acute) exacerbation: Secondary | ICD-10-CM

## 2016-09-03 MED ORDER — ALBUTEROL SULFATE (2.5 MG/3ML) 0.083% IN NEBU: 2.5000 mg | INHALATION_SOLUTION | RESPIRATORY_TRACT | 1 refills | Status: DC | PRN

## 2016-09-03 MED ORDER — PREDNISONE 1 MG PO TABS: 10.0000 mg | ORAL_TABLET | Freq: Every day | ORAL | Status: DC

## 2016-09-03 NOTE — Assessment & Plan Note (Signed)
   Continue aeroallergen avoidance measures and fluticasone nasal spray as needed.  Nasal saline lavage (NeilMed) has been recommended as needed and prior to medicated nasal sprays along with instructions for proper administration.  For thick post nasal drainage, add guaifenesin 600 mg (Mucinex)  twice daily as needed with adequate hydration as discussed.

## 2016-09-03 NOTE — Progress Notes (Signed)
Follow-up Note  RE: Mary Roth MRN: [id number] DOB: 1928-07-04 Date of Office Visit: 09/03/2016  Primary care provider: [name], MD Referring provider: Karleen Hampshire., MD  History of present illness: Mary Roth is a 83 y.o. female with asthma, allergic rhinitis, and alpha gal hypersensitivity presenting today for a sick visit.  She was last seen in this clinic in September 2017.  She reports that approximately 2 months if she had x-ray documented pneumonia.  She reports that she has had lingering coughing and wheezing since that time.  She currently takes Advair 250/50 g, one inhalation at bedtime, and montelukast 10 mg daily at bedtime.  She denies fevers, chills, and foul mucus production.  She has no specific nasal/sinus symptoms today.  She avoids mammalian meats and peanuts and has access to epinephrine autoinjectors in case of accidental ingestion followed by systemic symptoms.   Assessment and plan: Asthma with acute exacerbation  Prednisone has been provided, 40 mg x3 days, 20 mg x1 day, 10 mg x1 day, then stop.  For now, increase Advair 250/50 g to one inhalation twice a day rather than one inhalation daily at bedtime.  Continue montelukast 10 mg daily at bedtime and albuterol HFA, 1-2 inhalations every 4-6 hours as needed.  The patient has been asked to contact me if her symptoms persist or progress. Otherwise, she may return for follow up in 4 months.  Cough, persistent Postinfectious cough/asthma exacerbation.  Treatment plan as outlined above.  Perennial and seasonal allergic rhinitis  Continue aeroallergen avoidance measures and fluticasone nasal spray as needed.  Nasal saline lavage (NeilMed) has been recommended as needed and prior to medicated nasal sprays along with instructions for proper administration.  For thick post nasal drainage, add guaifenesin 600 mg (Mucinex)  twice daily as needed with adequate hydration as discussed.   Meds ordered  this encounter  Medications  . albuterol (PROVENTIL) (2.5 MG/3ML) 0.083% nebulizer solution    Sig: Take 3 mLs (2.5 mg total) by nebulization every 4 (four) hours as needed for wheezing.    Dispense:  75 mL    Refill:  1  . predniSONE (DELTASONE) tablet 10 mg    Diagnostics: Spirometry reveals an FVC of 1.51 L and an FEV1 of 1.28 L with 100 mL (8%) postbronchodilator improvement.  Please see scanned spirometry results for details.    Physical examination: Blood pressure (!) 150/84, pulse 72, temperature 98 F (36.7 C), temperature source Oral, resp. rate 12, SpO2 97 %.  General: Alert, interactive, in no acute distress. HEENT: TMs pearly gray, turbinates mildly edematous without discharge, post-pharynx moderately erythematous. Neck: Supple without lymphadenopathy. Lungs: expiratory wheezing, upper airway noise. CV: Normal S1, S2 without murmurs. Skin: Warm and dry, without lesions or rashes.  The following portions of the patient's history were reviewed and updated as appropriate: allergies, current medications, past family history, past medical history, past social history, past surgical history and problem list.  Allergies as of 09/03/2016      Reactions   Beef Extract Anaphylaxis   Lambs Quarters Anaphylaxis   Other Anaphylaxis   Peanut Oil Swelling   Poractant Alfa Anaphylaxis   Pork-derived Products Anaphylaxis   Phenergan [promethazine Hcl] Nausea And Vomiting      Medication List       Accurate as of 09/03/16 10:15 AM. Always use your most recent med list.          amLODipine 2.5 MG tablet Commonly known as:  NORVASC Take 1 tablet (2.5  mg total) by mouth daily.   EPINEPHrine 0.3 mg/0.3 mL Soaj injection Commonly known as:  EPIPEN 2-PAK Inject 0.3 mLs (0.3 mg total) into the muscle once.   fexofenadine 180 MG tablet Commonly known as:  ALLEGRA Take 180 mg by mouth daily as needed (asthma).   fluticasone 50 MCG/ACT nasal spray Commonly known as:   FLONASE Place into the nose.   Fluticasone-Salmeterol 250-50 MCG/DOSE Aepb Commonly known as:  ADVAIR DISKUS Inhale 1 puff into the lungs 2 (two) times daily.   levothyroxine 88 MCG tablet Commonly known as:  SYNTHROID, LEVOTHROID Take by mouth.   montelukast 10 MG tablet Commonly known as:  SINGULAIR Take 1 tablet (10 mg total) by mouth at bedtime.   nebivolol 5 MG tablet Commonly known as:  BYSTOLIC Take 1 tablet (5 mg total) by mouth daily.   oseltamivir 75 MG capsule Commonly known as:  TAMIFLU TAKE ONE CAPSULE EVERY 12 HOURS HOURS FOR FIVE DAYS   predniSONE 10 MG (21) Tbpk tablet Commonly known as:  STERAPRED UNI-PAK 21 TAB Take 1 tablet (10 mg total) by mouth daily. Take 1-5 tablets by mouth daily as directed.   PROAIR HFA 108 (90 Base) MCG/ACT inhaler Generic drug:  albuterol Inhale into the lungs every 6 (six) hours as needed for wheezing or shortness of breath.   albuterol (2.5 MG/3ML) 0.083% nebulizer solution Commonly known as:  PROVENTIL Take 3 mLs (2.5 mg total) by nebulization every 4 (four) hours as needed for wheezing.   rOPINIRole 0.25 MG tablet Commonly known as:  REQUIP Take 1 tablet (0.25 mg total) by mouth at bedtime. 1 pill 2 hrs pre bedtime   rosuvastatin 5 MG tablet Commonly known as:  CRESTOR Take 1 tablet (5 mg total) by mouth at bedtime.   spironolactone 25 MG tablet Commonly known as:  ALDACTONE Take 1 tablet (25 mg total) by mouth daily.   ZYRTEC ALLERGY 10 MG tablet Generic drug:  cetirizine Take 10 mg by mouth daily.       Allergies  Allergen Reactions  . Beef Extract Anaphylaxis  . Lambs Quarters Anaphylaxis  . Other Anaphylaxis  . Peanut Oil Swelling  . Poractant Alfa Anaphylaxis  . Pork-Derived Products Anaphylaxis  . Phenergan [Promethazine Hcl] Nausea And Vomiting   Review of systems: Review of systems negative except as noted in HPI / PMHx or noted below: Constitutional: Negative.  HENT: Negative.   Eyes:  Negative.  Respiratory: Negative.   Cardiovascular: Negative.  Gastrointestinal: Negative.  Genitourinary: Negative.  Musculoskeletal: Negative.  Neurological: Negative.  Endo/Heme/Allergies: Negative.  Cutaneous: Negative.  Past Medical History:  Diagnosis Date  . Abnormal liver function test   . Arthritis   . Chest pain    a. 06/2013 [facility] MV: no ischemia/infarct;  b. 12/2015 MV ([place]): EF 82%, no ischemia.  . Esophageal reflux   . Hepatic steatosis   . Mastodynia   . Osteopenia 03/2012   T score -1.4 FRAX 20%/11%; Dr Phineas Real  . Other and unspecified hyperlipidemia   . Personal history of colonic polyps 08/21/2006   ADENOMATOUS POLYP  . Progressive Dyspnea    a. 06/2013 Echo: EF 55-60%, Gr1 DD;  b. 03/2014 CPX testing: indeterminate cv response due to suboptimal cv stress load and blunted chronotropic response. Nl pulm response, nl PFTs;  c. 12/2015 Echo [facility]): EF 55-60%, no significant valve dzs.  [name] Unspecified asthma(493.90) 2003    no childhood asthma  . Unspecified essential hypertension   . Unspecified hypothyroidism  Family History  Problem Relation Age of Onset  . Uterine cancer Mother   . Ovarian cancer Mother   . Eczema Mother   . Heart attack Father 25  . Prostate cancer Father   . Breast cancer Sister     x 3 sisters  . Asthma Sister   . Food Allergy Sister   . Alcohol abuse Brother     x 3  . Prostate cancer Brother     x 3 brothers  . Pancreatic cancer Brother     x 2  . Diabetes Neg Hx   . Stroke Neg Hx   . Allergic rhinitis Neg Hx   . Urticaria Neg Hx     Social History   Social History  . Marital status: Married    Spouse name: N/A  . Number of children: 2  . Years of education: N/A   Occupational History  . homemaker Retired   Social History Main Topics  . Smoking status: Never Smoker  . Smokeless tobacco: Never Used  . Alcohol use 2.4 oz/week    4 Shots of liquor per week     Comment:  socially  . Drug use: No  .  Sexual activity: No   Other Topics Concern  . Not on file   Social History Narrative  . No narrative on file    I appreciate the opportunity to take part in [place] care. Please do not hesitate to contact me with questions.  Sincerely,   R. Edgar Frisk, MD

## 2016-09-03 NOTE — Patient Instructions (Addendum)
Asthma with acute exacerbation  Prednisone has been provided, 40 mg x3 days, 20 mg x1 day, 10 mg x1 day, then stop.  For now, increase Advair 250/50 g to one inhalation twice a day rather than one inhalation daily at bedtime.  Continue montelukast 10 mg daily at bedtime and albuterol HFA, 1-2 inhalations every 4-6 hours as needed.  The patient has been asked to contact me if her symptoms persist or progress. Otherwise, she may return for follow up in 4 months.  Cough, persistent Postinfectious cough/asthma exacerbation.  Treatment plan as outlined above.  Perennial and seasonal allergic rhinitis  Continue aeroallergen avoidance measures and fluticasone nasal spray as needed.  Nasal saline lavage (NeilMed) has been recommended as needed and prior to medicated nasal sprays along with instructions for proper administration.  For thick post nasal drainage, add guaifenesin 600 mg (Mucinex)  twice daily as needed with adequate hydration as discussed.   Return in about 4 months (around 01/03/2017), or if symptoms worsen or fail to improve.

## 2016-09-03 NOTE — Assessment & Plan Note (Signed)
Postinfectious cough/asthma exacerbation.  Treatment plan as outlined above.

## 2016-09-03 NOTE — Assessment & Plan Note (Signed)
   Prednisone has been provided, 40 mg x3 days, 20 mg x1 day, 10 mg x1 day, then stop.  For now, increase Advair 250/50 g to one inhalation twice a day rather than one inhalation daily at bedtime.  Continue montelukast 10 mg daily at bedtime and albuterol HFA, 1-2 inhalations every 4-6 hours as needed.  The patient has been asked to contact me if her symptoms persist or progress. Otherwise, she may return for follow up in 4 months.

## 2016-09-25 ENCOUNTER — Encounter (HOSPITAL_BASED_OUTPATIENT_CLINIC_OR_DEPARTMENT_OTHER): Payer: Self-pay | Admitting: *Deleted

## 2016-09-25 ENCOUNTER — Emergency Department (HOSPITAL_BASED_OUTPATIENT_CLINIC_OR_DEPARTMENT_OTHER)
Admission: EM | Admit: 2016-09-25 | Discharge: 2016-09-25 | Disposition: A | Discharge status: Home / Self Care | Payer: Medicare Other | Attending: Emergency Medicine | Admitting: Emergency Medicine

## 2016-09-25 DIAGNOSIS — J45909 Unspecified asthma, uncomplicated: Secondary | ICD-10-CM | POA: Diagnosis not present

## 2016-09-25 DIAGNOSIS — Z85828 Personal history of other malignant neoplasm of skin: Secondary | ICD-10-CM | POA: Diagnosis not present

## 2016-09-25 DIAGNOSIS — R0602 Shortness of breath: Secondary | ICD-10-CM | POA: Diagnosis not present

## 2016-09-25 DIAGNOSIS — R350 Frequency of micturition: Secondary | ICD-10-CM | POA: Diagnosis present

## 2016-09-25 DIAGNOSIS — Z79899 Other long term (current) drug therapy: Secondary | ICD-10-CM | POA: Diagnosis not present

## 2016-09-25 DIAGNOSIS — E039 Hypothyroidism, unspecified: Secondary | ICD-10-CM | POA: Insufficient documentation

## 2016-09-25 DIAGNOSIS — I10 Essential (primary) hypertension: Secondary | ICD-10-CM | POA: Insufficient documentation

## 2016-09-25 DIAGNOSIS — N3000 Acute cystitis without hematuria: Secondary | ICD-10-CM

## 2016-09-25 HISTORY — DX: Lipid storage disorder, unspecified: E75.6

## 2016-09-25 HISTORY — DX: Other intervertebral disc degeneration, lumbar region without mention of lumbar back pain or lower extremity pain: M51.369

## 2016-09-25 HISTORY — DX: Unspecified asthma, uncomplicated: J45.909

## 2016-09-25 HISTORY — DX: Other disorders of plasma-protein metabolism, not elsewhere classified: E88.09

## 2016-09-25 HISTORY — DX: Cardiac murmur, unspecified: R01.1

## 2016-09-25 HISTORY — DX: Spinal stenosis, site unspecified: M48.00

## 2016-09-25 HISTORY — DX: Other intervertebral disc degeneration, lumbar region: M51.36

## 2016-09-25 HISTORY — DX: Other intervertebral disc degeneration, lumbosacral region: M51.37

## 2016-09-25 HISTORY — DX: Other intervertebral disc degeneration, lumbosacral region without mention of lumbar back pain or lower extremity pain: M51.379

## 2016-09-25 LAB — COMPREHENSIVE METABOLIC PANEL
ALT: 27 U/L (ref 14–54)
AST: 42 U/L — AB (ref 15–41)
Albumin: 3.5 g/dL (ref 3.5–5.0)
Alkaline Phosphatase: 90 U/L (ref 38–126)
Anion gap: 11 (ref 5–15)
BILIRUBIN TOTAL: 0.5 mg/dL (ref 0.3–1.2)
BUN: 24 mg/dL — AB (ref 6–20)
CO2: 26 mmol/L (ref 22–32)
Calcium: 9.2 mg/dL (ref 8.9–10.3)
Chloride: 102 mmol/L (ref 101–111)
Creatinine, Ser: 1.03 mg/dL — ABNORMAL HIGH (ref 0.44–1.00)
GFR calc Af Amer: 56 mL/min — ABNORMAL LOW (ref >60)
GFR, EST NON AFRICAN AMERICAN: 48 mL/min — AB (ref >60)
Glucose, Bld: 145 mg/dL — ABNORMAL HIGH (ref 65–99)
POTASSIUM: 4.8 mmol/L (ref 3.5–5.1)
Sodium: 139 mmol/L (ref 135–145)
TOTAL PROTEIN: 6.7 g/dL (ref 6.5–8.1)

## 2016-09-25 LAB — CBC WITH DIFFERENTIAL/PLATELET
BASOS ABS: 0 10*3/uL (ref 0.0–0.1)
BASOS PCT: 0 %
EOS ABS: 0.1 10*3/uL (ref 0.0–0.7)
EOS PCT: 1 %
HCT: 39.3 % (ref 36.0–46.0)
Hemoglobin: 13.2 g/dL (ref 12.0–15.0)
LYMPHS PCT: 7 %
Lymphs Abs: 1 10*3/uL (ref 0.7–4.0)
MCH: 32.2 pg (ref 26.0–34.0)
MCHC: 33.6 g/dL (ref 30.0–36.0)
MCV: 95.9 fL (ref 78.0–100.0)
Monocytes Absolute: 0.6 10*3/uL (ref 0.1–1.0)
Monocytes Relative: 4 %
Neutro Abs: 13 10*3/uL — ABNORMAL HIGH (ref 1.7–7.7)
Neutrophils Relative %: 88 %
PLATELETS: 196 10*3/uL (ref 150–400)
RBC: 4.1 MIL/uL (ref 3.87–5.11)
RDW: 14 % (ref 11.5–15.5)
WBC: 14.8 10*3/uL — AB (ref 4.0–10.5)

## 2016-09-25 LAB — URINALYSIS, ROUTINE W REFLEX MICROSCOPIC
BILIRUBIN URINE: NEGATIVE
Glucose, UA: NEGATIVE mg/dL
Hgb urine dipstick: NEGATIVE
Ketones, ur: NEGATIVE mg/dL
NITRITE: POSITIVE — AB
PH: 5.5 (ref 5.0–8.0)
Protein, ur: NEGATIVE mg/dL
SPECIFIC GRAVITY, URINE: 1.016 (ref 1.005–1.030)

## 2016-09-25 LAB — URINALYSIS, MICROSCOPIC (REFLEX)

## 2016-09-25 MED ORDER — CEPHALEXIN 500 MG PO CAPS: 500.0000 mg | ORAL_CAPSULE | Freq: Four times a day (QID) | ORAL | 0 refills | Status: AC

## 2016-09-25 MED ORDER — CEPHALEXIN 250 MG PO CAPS
500.0000 mg | ORAL_CAPSULE | Freq: Once | ORAL | Status: AC
  Administered 2016-09-25: 500 mg via ORAL
  Filled 2016-09-25: qty 2

## 2016-09-25 NOTE — ED Triage Notes (Signed)
Patient states she has had urinary frequency last night 8-9 times.  States approximately two hours pta, she developed chills and burning around her mouth, which was associated with sob.  Patient has a history of asthma. States she feels better now.

## 2016-09-25 NOTE — ED Provider Notes (Signed)
[place] [facility] Provider Note   CSN: [id number] Arrival date & time: 09/25/16  1645  By signing my name below, I, Collene Leyden, attest that this documentation has been prepared under the direction and in the presence of Martinique Russo, PA-C. Electronically Signed: Collene Leyden, Scribe. 09/25/16. 8:40 PM.  History   Chief Complaint Chief Complaint  Patient presents with  . Urinary Frequency   HPI Comments: Mary Roth is a 83 y.o. female with a history of urinary tract infections, who presents to the Emergency Department complaining of sudden-onset, intermittent urinary frequency that began 3-4 days ago. Patient states she had 8-9 episodes of urinary frequency last night. Patient states her symptoms are progressively worsening. Patient states her symptoms are similar to a urinary tract infection. Patient reports associated chills and left lower back pain. No modifying factors indicated. Patient describes her left lower back pain as aching. Patient denies any fever, nausea, vomiting, abdominal pain, or any other symptoms. Reports recent abx use, "some with sulfa" about 5 weeks ago for pneumonia.   Patient is reports episodes of shortness of breath. Patient has a history of asthma, she has been using her albuterol inhalers. Pt states she commonly Gets asthma attacks during times of illness. States she has no difficulty controlling them with her inhaler.  Patient denies shortness of breath any ER, or any other symptoms.    The history is provided by the patient. No language interpreter was used.    Past Medical History:  Diagnosis Date  . Abnormal liver function test   . Alpha galactosidase deficiency   . Arthritis   . Asthma   . Chest pain    a. 06/2013 [facility] MV: no ischemia/infarct;  b. 12/2015 MV ([place]): EF 82%, no ischemia.  . DDD (degenerative disc disease), lumbar   . DDD (degenerative disc disease), lumbosacral   . Esophageal reflux   . Hepatic steatosis   .  Mastodynia   . Murmur   . Osteopenia 03/2012   T score -1.4 FRAX 20%/11%; Dr Phineas Real  . Other and unspecified hyperlipidemia   . Personal history of colonic polyps 08/21/2006   ADENOMATOUS POLYP  . Progressive Dyspnea    a. 06/2013 Echo: EF 55-60%, Gr1 DD;  b. 03/2014 CPX testing: indeterminate cv response due to suboptimal cv stress load and blunted chronotropic response. Nl pulm response, nl PFTs;  c. 12/2015 Echo [facility]): EF 55-60%, no significant valve dzs.  [name] Spinal stenosis   . Unspecified asthma(493.90) 2003    no childhood asthma  . Unspecified essential hypertension   . Unspecified hypothyroidism     Patient Active Problem List   Diagnosis Date Noted  . Cough, persistent 09/03/2016  . Moderate persistent asthma 02/04/2016  . Acute bronchitis 02/04/2016  . Food allergy, alpha-gal hypersensitivity 01/01/2016  . Perennial and seasonal allergic rhinitis 12/05/2015  . Allergic reaction 11/07/2015  . Mild intermittent asthma 11/07/2015  . History of insect sting allergy 11/07/2015  . Angioedema 11/07/2015  . Palpitations 02/13/2015  . Rotator cuff tear arthropathy of right shoulder 08/14/2014  . Spondylolisthesis of lumbosacral region 12/20/2013  . Lichen planus 37/16/9678  . Benign paroxysmal positional vertigo 08/20/2013  . Severe obesity (BMI >= 40) ([place]) 05/07/2013  . Asthma with acute exacerbation 02/07/2013  . OSTEOPENIA 05/19/2010  . MASTODYNIA 05/17/2009  . Hyperlipidemia 01/12/2009  . Essential hypertension 01/12/2009  . SKIN CANCER, HX OF 01/12/2009  . COLONIC POLYPS, HX OF 01/12/2009  . GERD 10/12/2007  . Asthma 10/07/2007  .  EDEMA- LOCALIZED 10/07/2007  . Hypothyroidism 01/15/2007    Past Surgical History:  Procedure Laterality Date  . APPENDECTOMY    . CHOLECYSTECTOMY  1973    for stones  . TONSILLECTOMY    . UTERINE SUSPENSION  [address] , [place]    OB History    Gravida Para Term Preterm AB Living   2 2 2     2    SAB TAB Ectopic  Multiple Live Births                   Home Medications    Prior to Admission medications   Medication Sig Start Date End Date Taking? Authorizing Provider  albuterol (PROAIR HFA) 108 (90 Base) MCG/ACT inhaler Inhale into the lungs every 6 (six) hours as needed for wheezing or shortness of breath.   Yes [provider]  albuterol (PROVENTIL) (2.5 MG/3ML) 0.083% nebulizer solution Take 3 mLs (2.5 mg total) by nebulization every 4 (four) hours as needed for wheezing. 09/03/16  Yes Bobbitt, Sedalia Muta, MD  amLODipine (NORVASC) 2.5 MG tablet Take 1 tablet (2.5 mg total) by mouth daily. 05/14/16  Yes Rogelia Mire, NP  cetirizine (ZYRTEC ALLERGY) 10 MG tablet Take 10 mg by mouth daily.   Yes [provider]  EPINEPHrine (EPIPEN 2-PAK) 0.3 mg/0.3 mL IJ SOAJ injection Inject 0.3 mLs (0.3 mg total) into the muscle once. 11/07/15  Yes Bobbitt, Sedalia Muta, MD  fexofenadine (ALLEGRA) 180 MG tablet Take 180 mg by mouth daily as needed (asthma).    Yes [provider]  fluticasone (FLONASE) 50 MCG/ACT nasal spray Place into the nose.   Yes [provider]  Fluticasone-Salmeterol (ADVAIR DISKUS) 250-50 MCG/DOSE AEPB Inhale 1 puff into the lungs 2 (two) times daily. Patient taking differently: Inhale 1 puff into the lungs at bedtime.  02/04/16  Yes Bardelas, Jens Som, MD  levothyroxine (SYNTHROID, LEVOTHROID) 88 MCG tablet Take by mouth. 07/24/15  Yes [provider]  montelukast (SINGULAIR) 10 MG tablet Take 1 tablet (10 mg total) by mouth at bedtime. 02/04/16  Yes Bardelas, Jose A, MD  nebivolol (BYSTOLIC) 5 MG tablet Take 1 tablet (5 mg total) by mouth daily. 05/14/16  Yes Rogelia Mire, NP  rosuvastatin (CRESTOR) 5 MG tablet Take 1 tablet (5 mg total) by mouth at bedtime. 05/14/16  Yes Rogelia Mire, NP  spironolactone (ALDACTONE) 25 MG tablet Take 1 tablet (25 mg total) by mouth daily. 07/11/16  Yes Troy Sine, MD  cephALEXin (KEFLEX) 500 MG  capsule Take 1 capsule (500 mg total) by mouth 4 (four) times daily. 09/25/16 10/02/16  Russo, [place] N, PA-C  oseltamivir (TAMIFLU) 75 MG capsule TAKE ONE CAPSULE EVERY 12 HOURS HOURS FOR FIVE DAYS 06/16/16   Charlies Silvers, MD  predniSONE (STERAPRED UNI-PAK 21 TAB) 10 MG (21) TBPK tablet Take 1 tablet (10 mg total) by mouth daily. Take 1-5 tablets by mouth daily as directed. 05/14/16   Rogelia Mire, NP  rOPINIRole (REQUIP) 0.25 MG tablet Take 1 tablet (0.25 mg total) by mouth at bedtime. 1 pill 2 hrs pre bedtime 05/01/14   Hendricks Limes, MD    Family History Family History  Problem Relation Age of Onset  . Uterine cancer Mother   . Ovarian cancer Mother   . Eczema Mother   . Heart attack Father 54  . Prostate cancer Father   . Breast cancer Sister        x 3 sisters  .  Asthma Sister   . Food Allergy Sister   . Alcohol abuse Brother        x 3  . Prostate cancer Brother        x 3 brothers  . Pancreatic cancer Brother        x 2  . Diabetes Neg Hx   . Stroke Neg Hx   . Allergic rhinitis Neg Hx   . Urticaria Neg Hx     Social History Social History  Substance Use Topics  . Smoking status: Never Smoker  . Smokeless tobacco: Never Used  . Alcohol use 2.4 oz/week    4 Shots of liquor per week     Comment:  socially     Allergies   Beef extract; Lambs quarters; Other; Peanut oil; Poractant alfa; Pork-derived products; and Phenergan [promethazine hcl]   Review of Systems Review of Systems  Constitutional: Positive for chills. Negative for fever.  Respiratory: Positive for shortness of breath.   Gastrointestinal: Negative for abdominal pain, nausea and vomiting.  Genitourinary: Positive for frequency. Negative for dysuria, flank pain (left) and hematuria.  Musculoskeletal: Positive for back pain.     Physical Exam Updated Vital Signs BP (!) 121/58 (BP Location: Left Arm)   Pulse 75   Temp 98.8 F (37.1 C) (Oral)   Resp 16   Ht 4\' 10"  (1.473 m)   Wt 187  lb (84.8 kg)   SpO2 93%   BMI 39.08 kg/m   Physical Exam  Constitutional: She appears well-developed and well-nourished.  HENT:  Head: Normocephalic and atraumatic.  Eyes: Conjunctivae are normal.  Cardiovascular: Normal rate, regular rhythm, normal heart sounds and intact distal pulses.  Exam reveals no gallop and no friction rub.   No murmur heard. Pulmonary/Chest: Effort normal and breath sounds normal. No respiratory distress. She has no wheezes. She has no rales.  Abdominal: Soft. Normal appearance and bowel sounds are normal. She exhibits no distension. There is no tenderness. There is no rebound and no guarding.  No CVA tenderness to percussion bilaterally. TTP to the left lower back.   Psychiatric: She has a normal mood and affect. Her behavior is normal.  Nursing note and vitals reviewed.    ED Treatments / Results  DIAGNOSTIC STUDIES: Oxygen Saturation is 100% on RA, normal by my interpretation.    COORDINATION OF CARE: 8:39 PM Discussed treatment plan with pt at bedside and pt agreed to plan, which includes blood work and antibiotics.   Labs (all labs ordered are listed, but only abnormal results are displayed) Labs Reviewed  URINALYSIS, ROUTINE W REFLEX MICROSCOPIC - Abnormal; Notable for the following:       Result Value   APPearance CLOUDY (*)    Nitrite POSITIVE (*)    Leukocytes, UA MODERATE (*)    All other components within normal limits  URINALYSIS, MICROSCOPIC (REFLEX) - Abnormal; Notable for the following:    Bacteria, UA MANY (*)    Squamous Epithelial / LPF 0-5 (*)    All other components within normal limits  COMPREHENSIVE METABOLIC PANEL - Abnormal; Notable for the following:    Glucose, Bld 145 (*)    BUN 24 (*)    Creatinine, Ser 1.03 (*)    AST 42 (*)    GFR calc non Af Amer 48 (*)    GFR calc Af Amer 56 (*)    All other components within normal limits  CBC WITH DIFFERENTIAL/PLATELET - Abnormal; Notable for the following:    WBC  14.8 (*)      Neutro Abs 13.0 (*)    All other components within normal limits    EKG  EKG Interpretation None       Radiology No results found.  Procedures Procedures (including critical care time)  Medications Ordered in ED Medications  cephALEXin (KEFLEX) capsule 500 mg (500 mg Oral Given 09/25/16 2104)     Initial Impression / Assessment and Plan / ED Course  I have reviewed the triage vital signs and the nursing notes.  Pertinent labs & imaging results that were available during my care of the patient were reviewed by me and considered in my medical decision making (see chart for details).     Pt has been diagnosed with a UTI. Pt is afebrile, no CVA tenderness, normotensive, and denies N/V or hematuria. Doubt pyelonephritis. Slight WBC elevation, consistent w UTI. Cr without significant change from baseline. Pt given dose of Keflex in ED, and to be dc home with Keflex and instructions to follow up with PCP if symptoms persist. Pt hemodynamically stable, well-appearing, safe prior to discharge.  Patient discussed with and seen by Dr. Sherry Ruffing. Discussed results, findings, treatment and follow up. Patient advised of return precautions. Patient verbalized understanding and agreed with plan.   Final Clinical Impressions(s) / ED Diagnoses   Final diagnoses:  Acute cystitis without hematuria    New Prescriptions Discharge Medication List as of 09/25/2016 10:12 PM    START taking these medications   Details  cephALEXin (KEFLEX) 500 MG capsule Take 1 capsule (500 mg total) by mouth 4 (four) times daily., Starting Thu 09/25/2016, Until Thu 10/02/2016, Print      I personally performed the services described in this documentation, which was scribed in my presence. The recorded information has been reviewed and is accurate.     Russo, [place] N, PA-C 09/26/16 [id number]    Tegeler, Gwenyth Allegra, MD 10/03/16 [phone]

## 2016-09-25 NOTE — ED Notes (Signed)
Pt. Reports she started having chills today and felt like she had an asthma attack coming on.  Pt. Reports anytime she has anything going on health wise she goes into an asthma attack.  Pt. Reports she has been urinating for the past 3-4 days about every hour with no burning.  Pt. Does report some odor with the urination.

## 2016-09-25 NOTE — Discharge Instructions (Signed)
Please read instructions below. Take your antibiotic, Keflex (cephalexin), 4 times per day until it is gone. Drink plenty of water.  Follow-up with your primary care provider about any new medications. Return to the ER for fever, nausea, vomiting, or new or worsening symptoms.

## 2016-09-25 NOTE — ED Notes (Signed)
Pt trying to leave at discharge, she verbalizes understanding of dc instructions and denies any further needs at this time

## 2016-10-31 ENCOUNTER — Telehealth: Payer: Self-pay | Admitting: Cardiovascular Disease

## 2016-10-31 NOTE — Telephone Encounter (Signed)
New message    Pt is calling.   Pt c/o swelling: STAT is pt has developed SOB within 24 hours  1. How long have you been experiencing swelling? A week.   2. Where is the swelling located? Calf, ankles, and feet  3.  Are you currently taking a "fluid pill"? No  4.  Are you currently SOB? Earlier in the week, it's gotten better.  5.  Have you traveled recently? No

## 2016-10-31 NOTE — Telephone Encounter (Signed)
S/w pt she states that she has been swelling in her calf, ankles, and feet this week, she states that mon,tues and wed she did have some heaviness in her chest(she is still swelling now-but no heaviness in her chest) , but pt denies any other symptoms, nausea, vomiting, chest pain or pressure, shortness of breath, no lightheaded or dizziness. She states that swelling does go down a little at rest but is still present in the morning upon waking. Pt does not take her BP at home. Please advise

## 2016-10-31 NOTE — Telephone Encounter (Signed)
Discussed with Mary Roth, patient can take extra furosemide daily for the next 3 days for her edema. She will need to keep her legs elevated, watch her sodium and get compression hose to wear during the day. She has a follow up appt next week and will check blood work at that appt. Left message for pt to call

## 2016-11-02 NOTE — Telephone Encounter (Signed)
Agree 

## 2016-11-03 NOTE — Telephone Encounter (Signed)
noted 

## 2016-11-07 ENCOUNTER — Ambulatory Visit (INDEPENDENT_AMBULATORY_CARE_PROVIDER_SITE_OTHER): Payer: Medicare Other | Admitting: Physician Assistant

## 2016-11-07 ENCOUNTER — Encounter: Payer: Self-pay | Admitting: Physician Assistant

## 2016-11-07 VITALS — BP 122/66 | HR 72 | Ht <= 58 in | Wt 192.0 lb

## 2016-11-07 DIAGNOSIS — R0609 Other forms of dyspnea: Secondary | ICD-10-CM

## 2016-11-07 DIAGNOSIS — Z79899 Other long term (current) drug therapy: Secondary | ICD-10-CM

## 2016-11-07 DIAGNOSIS — R600 Localized edema: Secondary | ICD-10-CM

## 2016-11-07 DIAGNOSIS — I10 Essential (primary) hypertension: Secondary | ICD-10-CM

## 2016-11-07 NOTE — Progress Notes (Signed)
Cardiology Office Note    Date:  11/07/2016   ID:  Mary Roth, DOB 21-Jun-1928, MRN [id number]  PCP:  Karleen Hampshire., MD  Cardiologist:  Dr. Claiborne Billings  Chief Complaint  Patient presents with  . Follow-up    seen for Dr. Claiborne Billings, progressive dyspnea, new LE edema in past month    History of Present Illness:  Mary Roth is a 83 y.o. female with PMH of asthma, chronic dyspnea, HTN and h/o CP with negative myoview in 2015 and 12/2015. Echocardiogram in February 2015 showed EF 50-93%, grade 1 diastolic dysfunction. He was seen in January for worsening dyspnea for 2 weeks. He also reported episodes of intermittent chest tightness in the setting of asthma exacerbation. She was seen by Bernerd Pho PA-C on 06/13/2013, she was still having shortness of breath at the time. Her O2 saturation however is 96% on room air in the office. BNP obtained was 49.5. It was felt her shortness of breath is likely noncardiac in nature. She went to the ED on 09/25/2016 with increase in urinary frequency. Urinalysis was positive for nitrite and many bacteria, she was given a dose of Keflex in the ED and given additional Keflex on discharge.  She contacted cardiology office on 10/29/2016 with lower extremity swelling and also weakness. She was instructed to take an additional dose of Lasix. She was recently seen by Dr. Creig Hines in [facility] for lower extremity edema and was started on Lasix 20 mg daily on 11/02/2016. Her lower extremity edema has significantly improved after a week of diuretic, however her shortness of breath continues. She has been short of breath for the past several month. Previous echocardiogram done at outside facility on 12/20/2015 showed EF 55-60%, no significant valvular disease. She says in the past few months, she had has been having worsening dyspnea and cannot walk very far without getting short of breath. In this case, we plan to repeat echocardiogram. Some of her shortness of breath is related  to obesity. Recent spirometry in April 2018 did not indicate significant obstructive disease. Her lung is clear on physical exam, there is no sign of volume overload after a week of diuretic. Her husband mentions her left lower extremity is to be more swollen than the right lower extremity. I will obtain a d-dimer. I will also obtain a basic metabolic panel with the current diuretic use. If renal function is stable, I will continue on 20 mg daily of Lasix. I will defer to Dr. Claiborne Billings to consider whether to obtain cardiopulmonary stress test which may tell us more regarding her functional ability and whether her symptom is related to combination of cardiac and pulmonary issues.   YEs EKG Echo?    Past Medical History:  Diagnosis Date  . Abnormal liver function test   . Alpha galactosidase deficiency   . Arthritis   . Asthma   . Chest pain    a. 06/2013 [facility]i MV: no ischemia/infarct;  b. 12/2015 MV ([place]): EF 82%, no ischemia.  . DDD (degenerative disc disease), lumbar   . DDD (degenerative disc disease), lumbosacral   . Esophageal reflux   . Hepatic steatosis   . Mastodynia   . Murmur   . Osteopenia 03/2012   T score -1.4 FRAX 20%/11%; Dr Phineas Real  . Other and unspecified hyperlipidemia   . Personal history of colonic polyps 08/21/2006   ADENOMATOUS POLYP  . Progressive Dyspnea    a. 06/2013 Echo: EF 55-60%, Gr1 DD;  b.  03/2014 CPX testing: indeterminate cv response due to suboptimal cv stress load and blunted chronotropic response. Nl pulm response, nl PFTs;  c. 12/2015 Echo [facility]): EF 55-60%, no significant valve dzs.  [name] Spinal stenosis   . Unspecified asthma(493.90) 2003    no childhood asthma  . Unspecified essential hypertension   . Unspecified hypothyroidism     Past Surgical History:  Procedure Laterality Date  . APPENDECTOMY    . CHOLECYSTECTOMY  1973    for stones  . TONSILLECTOMY    . UTERINE SUSPENSION  [address] , [place]    Current  Medications: Outpatient Medications Prior to Visit  Medication Sig Dispense Refill  . albuterol (PROAIR HFA) 108 (90 Base) MCG/ACT inhaler Inhale into the lungs every 6 (six) hours as needed for wheezing or shortness of breath.    [name] albuterol (PROVENTIL) (2.5 MG/3ML) 0.083% nebulizer solution Take 3 mLs (2.5 mg total) by nebulization every 4 (four) hours as needed for wheezing. 75 mL 1  . amLODipine (NORVASC) 2.5 MG tablet Take 1 tablet (2.5 mg total) by mouth daily. 30 tablet 11  . cetirizine (ZYRTEC ALLERGY) 10 MG tablet Take 10 mg by mouth daily.    [name] EPINEPHrine (EPIPEN 2-PAK) 0.3 mg/0.3 mL IJ SOAJ injection Inject 0.3 mLs (0.3 mg total) into the muscle once. 2 Device 1  . fexofenadine (ALLEGRA) 180 MG tablet Take 180 mg by mouth daily as needed (asthma).     . fluticasone (FLONASE) 50 MCG/ACT nasal spray Place into the nose.    [name] Fluticasone-Salmeterol (ADVAIR DISKUS) 250-50 MCG/DOSE AEPB Inhale 1 puff into the lungs 2 (two) times daily. (Patient taking differently: Inhale 1 puff into the lungs at bedtime. ) 60 each 5  . levothyroxine (SYNTHROID, LEVOTHROID) 88 MCG tablet Take by mouth.    . montelukast (SINGULAIR) 10 MG tablet Take 1 tablet (10 mg total) by mouth at bedtime. 30 tablet 5  . nebivolol (BYSTOLIC) 5 MG tablet Take 1 tablet (5 mg total) by mouth daily. 30 tablet 11  . oseltamivir (TAMIFLU) 75 MG capsule TAKE ONE CAPSULE EVERY 12 HOURS HOURS FOR FIVE DAYS 10 capsule 0  . predniSONE (STERAPRED UNI-PAK 21 TAB) 10 MG (21) TBPK tablet Take 1 tablet (10 mg total) by mouth daily. Take 1-5 tablets by mouth daily as directed. 15 tablet 0  . rOPINIRole (REQUIP) 0.25 MG tablet Take 1 tablet (0.25 mg total) by mouth at bedtime. 1 pill 2 hrs pre bedtime 30 tablet 2  . rosuvastatin (CRESTOR) 5 MG tablet Take 1 tablet (5 mg total) by mouth at bedtime. 30 tablet 11  . spironolactone (ALDACTONE) 25 MG tablet Take 1 tablet (25 mg total) by mouth daily. 60 tablet 11   Facility-Administered  Medications Prior to Visit  Medication Dose Route Frequency Provider Last Rate Last Dose  . predniSONE (DELTASONE) tablet 10 mg  10 mg Oral Q breakfast Bobbitt, [place] Muta, MD         Allergies:   Beef extract; Lambs quarters; Other; Peanut oil; Poractant alfa; Pork-derived products; and Phenergan [promethazine hcl]   Social History   Social History  . Marital status: Married    Spouse name: N/A  . Number of children: 2  . Years of education: N/A   Occupational History  . homemaker Retired   Social History Main Topics  . Smoking status: Never Smoker  . Smokeless tobacco: Never Used  . Alcohol use 2.4 oz/week    4 Shots of liquor per week  Comment:  socially  . Drug use: No  . Sexual activity: No   Other Topics Concern  . None   Social History Narrative  . None     Family History:  The patient's family history includes Alcohol abuse in her brother; Asthma in her sister; Breast cancer in her sister; Eczema in her mother; Food Allergy in her sister; Heart attack (age of onset: 75) in her father; Ovarian cancer in her mother; Pancreatic cancer in her brother; Prostate cancer in her brother and father; Uterine cancer in her mother.   ROS:   Please see the history of present illness.    ROS All other systems reviewed and are negative.   PHYSICAL EXAM:   VS:  BP 122/66   Pulse 72   Ht 4\' 10"  (1.473 m)   Wt 192 lb (87.1 kg)   BMI 40.13 kg/m    GEN: Well nourished, well developed, in no acute distress  HEENT: normal  Neck: no JVD, carotid bruits, or masses Cardiac: RRR; no murmurs, rubs, or gallops,no edema  Respiratory:  clear to auscultation bilaterally, normal work of breathing GI: soft, nontender, nondistended, + BS MS: no deformity or atrophy  Skin: warm and dry, no rash Neuro:  Alert and Oriented x 3, Strength and sensation are intact Psych: euthymic mood, full affect  Wt Readings from Last 3 Encounters:  11/07/16 192 lb (87.1 kg)  09/25/16 187 lb (84.8  kg)  06/13/16 189 lb 6.4 oz (85.9 kg)      Studies/Labs Reviewed:   EKG:  EKG is ordered today.  The ekg ordered today demonstrates Normal sinus rhythm without significant ST-T wave changes  Recent Labs: 06/13/2016: Brain Natriuretic Peptide 49.5 09/25/2016: ALT 27; BUN 24; Creatinine, Ser 1.03; Hemoglobin 13.2; Platelets 196; Potassium 4.8; Sodium 139   Lipid Panel    Component Value Date/Time   CHOL 232 (H) 05/17/2014 0834   TRIG 133.0 05/17/2014 0834   HDL 64.80 05/17/2014 0834   CHOLHDL 4 05/17/2014 0834   VLDL 26.6 05/17/2014 0834   LDLCALC 141 (H) 05/17/2014 0834   LDLDIRECT 106.8 01/10/2011 1218    Additional studies/ records that were reviewed today include:   Echo 06/20/2013 LV EF: 55% -  60%  Study Conclusions  Left ventricle: The cavity size was normal. Systolic function was normal. The estimated ejection fraction was in the range of 55% to 60%. Wall motion was normal; there were no regional wall motion abnormalities. There was an increased relative contribution of atrial contraction to ventricular filling. Doppler parameters are consistent with abnormal left ventricular relaxation (grade 1 diastolic dysfunction).          Myoview 06/23/2013 Stress ECG: No significant change from baseline ECG  QPS Raw Data Images:  Normal; no motion artifact; normal heart/lung ratio. Stress Images:  Normal homogeneous uptake in all areas of the myocardium. Rest Images:  Normal homogeneous uptake in all areas of the myocardium. Subtraction (SDS):  No evidence of ischemia. LV Wall Motion:  NL LV Function; NL Wall Motion  Impression Exercise Capacity:  Lexiscan with no exercise. BP Response:  Normal blood pressure response. Clinical Symptoms:  No significant symptoms noted. ECG Impression:  No significant ECG changes with Lexiscan. Comparison with Prior Nuclear Study: No previous nuclear study performed   Overall Impression:  Normal stress nuclear  study.  ASSESSMENT:    1. Dyspnea on exertion   2. Bilateral leg edema   3. Encounter for long-term (current) use of medications   4.  Essential hypertension      PLAN:  In order of problems listed above:  1. Dyspnea: Unclear cause has been ongoing since January. Did have pneumonia in January. However symptom has not resolved despite resolution of pneumonia. She is not complaining of any fever or chill. Her husband mentions she has worsening left lower extremity edema compared to the right, she has no edema today after a week of diuretic. I will obtain d-dimer. I will also obtain basic metabolic panel given the initiation of diuretic. She had a negative stress test and echocardiogram in August of last year, her shortness of breath however has been worsening since January, I will repeat her echocardiogram at this point. There is no obvious sign of angina, I will hold off on stress test at this time. Although we can potentially consider a cardiopulmonary stress test later today to service her exercise ability and also to assess if her symptoms could be a combination of cardiac versus pulmonary issues.  2. Bilateral lower extremity edema: Seen by Dr. Creig Hines last week and was placed on diuretic 20 mg Lasix. Her lower extremity edema has completely resolved. I will obtain a basic metabolic panel today.  3. Hypertension: Blood pressure will controlled    Medication Adjustments/Labs and Tests Ordered: Current medicines are reviewed at length with the patient today.  Concerns regarding medicines are outlined above.  Medication changes, Labs and Tests ordered today are listed in the Patient Instructions below. Patient Instructions  Medication Instructions:   No changes to current regimen.  Labwork:   D-Dimer, BMET today  Testing/Procedures:  Your physician has requested that you have an echocardiogram. Echocardiography is a painless test that uses sound waves to create images of your  heart. It provides your doctor with information about the size and shape of your heart and how well your heart's chambers and valves are working. This procedure takes approximately one hour. There are no restrictions for this procedure.    Follow-Up:  2-3 months with Dr. Claiborne Billings  If you need a refill on your cardiac medications before your next appointment, please call your pharmacy.      Hilbert Corrigan, [place]  11/07/2016 9:23 AM    [address], [place], [place]  [address] Phone: [phone]; Fax: [phone]

## 2016-11-07 NOTE — Patient Instructions (Signed)
Medication Instructions:   No changes to current regimen.  Labwork:   D-Dimer, BMET today  Testing/Procedures:  Your physician has requested that you have an echocardiogram. Echocardiography is a painless test that uses sound waves to create images of your heart. It provides your doctor with information about the size and shape of your heart and how well your heart's chambers and valves are working. This procedure takes approximately one hour. There are no restrictions for this procedure.    Follow-Up:  2-3 months with Dr. Claiborne Billings  If you need a refill on your cardiac medications before your next appointment, please call your pharmacy.

## 2016-11-08 LAB — BASIC METABOLIC PANEL
BUN / CREAT RATIO: 17 (ref 12–28)
BUN: 16 mg/dL (ref 8–27)
CO2: 25 mmol/L (ref 20–29)
CREATININE: 0.94 mg/dL (ref 0.57–1.00)
Calcium: 10 mg/dL (ref 8.7–10.3)
Chloride: 101 mmol/L (ref 96–106)
GFR calc non Af Amer: 55 mL/min/{1.73_m2} — ABNORMAL LOW (ref >59)
GFR, EST AFRICAN AMERICAN: 64 mL/min/{1.73_m2} (ref >59)
Glucose: 85 mg/dL (ref 65–99)
Potassium: 5.5 mmol/L — ABNORMAL HIGH (ref 3.5–5.2)
Sodium: 143 mmol/L (ref 134–144)

## 2016-11-08 LAB — D-DIMER, QUANTITATIVE: D-DIMER: 1.49 mg/L FEU — ABNORMAL HIGH (ref 0.00–0.49)

## 2016-11-10 ENCOUNTER — Telehealth: Payer: Self-pay | Admitting: *Deleted

## 2016-11-10 DIAGNOSIS — R0609 Other forms of dyspnea: Secondary | ICD-10-CM

## 2016-11-10 DIAGNOSIS — R7989 Other specified abnormal findings of blood chemistry: Secondary | ICD-10-CM

## 2016-11-10 MED ORDER — SPIRONOLACTONE 25 MG PO TABS: 12.5000 mg | ORAL_TABLET | Freq: Every day | ORAL | 11 refills | Status: DC

## 2016-11-10 NOTE — Telephone Encounter (Signed)
-----   Message from [place], [place] sent at 11/10/2016  1:46 PM EDT ----- Renal function ok, potassium elevated, recommend decrease spironolactone by half. D-dimer elevated, given recent SOB, need to rule out blood clot via bilateral lower extremity venous doppler

## 2016-11-10 NOTE — Telephone Encounter (Signed)
Results and recommendations discussed with patient - Pt aware to cut spironolactone in half and that I have put in doppler order - scheduler will call to arrange. Patient verbalized understanding and thanks, and will call back if she has further questions.

## 2016-11-11 ENCOUNTER — Ambulatory Visit (HOSPITAL_COMMUNITY)
Admission: RE | Admit: 2016-11-11 | Discharge: 2016-11-11 | Disposition: A | Discharge status: Home / Self Care | Payer: Medicare Other | Source: Ambulatory Visit | Attending: Cardiology | Admitting: Cardiology

## 2016-11-11 DIAGNOSIS — R7989 Other specified abnormal findings of blood chemistry: Secondary | ICD-10-CM | POA: Insufficient documentation

## 2016-11-11 DIAGNOSIS — R0609 Other forms of dyspnea: Secondary | ICD-10-CM

## 2016-11-19 ENCOUNTER — Ambulatory Visit (HOSPITAL_COMMUNITY): Payer: Medicare Other | Attending: Cardiovascular Disease

## 2016-11-19 ENCOUNTER — Other Ambulatory Visit: Payer: Self-pay

## 2016-11-19 DIAGNOSIS — R600 Localized edema: Secondary | ICD-10-CM | POA: Insufficient documentation

## 2016-11-19 DIAGNOSIS — R0609 Other forms of dyspnea: Secondary | ICD-10-CM | POA: Insufficient documentation

## 2016-11-25 ENCOUNTER — Telehealth: Payer: Self-pay | Admitting: Physician Assistant

## 2016-11-25 NOTE — Telephone Encounter (Signed)
Pt notified, questions answered nothing else needed she will call back if anything further is needed

## 2016-11-25 NOTE — Telephone Encounter (Signed)
Notes recorded by Almyra Deforest, PA on 11/20/2016 at 8:14 PM EDT Normal pumping function, normal pressure, no significant valve issue to explain her shortness of breath. Recommend continue observation

## 2016-11-25 NOTE — Telephone Encounter (Signed)
New message    Will not be home 12p-130p Pt is calling to find out results for the Echo and where they go from here if her test are normal what is causing her problem

## 2016-12-11 DIAGNOSIS — E039 Hypothyroidism, unspecified: Secondary | ICD-10-CM | POA: Diagnosis not present

## 2016-12-11 DIAGNOSIS — J45901 Unspecified asthma with (acute) exacerbation: Secondary | ICD-10-CM | POA: Diagnosis not present

## 2016-12-11 DIAGNOSIS — I10 Essential (primary) hypertension: Secondary | ICD-10-CM | POA: Diagnosis not present

## 2016-12-11 DIAGNOSIS — R0609 Other forms of dyspnea: Secondary | ICD-10-CM | POA: Diagnosis not present

## 2016-12-11 DIAGNOSIS — R062 Wheezing: Secondary | ICD-10-CM | POA: Diagnosis not present

## 2016-12-24 DIAGNOSIS — R0609 Other forms of dyspnea: Secondary | ICD-10-CM | POA: Diagnosis not present

## 2016-12-24 DIAGNOSIS — J45901 Unspecified asthma with (acute) exacerbation: Secondary | ICD-10-CM | POA: Diagnosis not present

## 2016-12-24 DIAGNOSIS — N289 Disorder of kidney and ureter, unspecified: Secondary | ICD-10-CM | POA: Diagnosis not present

## 2016-12-24 DIAGNOSIS — E875 Hyperkalemia: Secondary | ICD-10-CM | POA: Diagnosis not present

## 2016-12-26 DIAGNOSIS — N183 Chronic kidney disease, stage 3 unspecified: Secondary | ICD-10-CM | POA: Insufficient documentation

## 2017-01-06 DIAGNOSIS — E875 Hyperkalemia: Secondary | ICD-10-CM | POA: Diagnosis not present

## 2017-01-07 DIAGNOSIS — K219 Gastro-esophageal reflux disease without esophagitis: Secondary | ICD-10-CM | POA: Diagnosis not present

## 2017-01-07 DIAGNOSIS — J4540 Moderate persistent asthma, uncomplicated: Secondary | ICD-10-CM | POA: Diagnosis not present

## 2017-01-07 DIAGNOSIS — J181 Lobar pneumonia, unspecified organism: Secondary | ICD-10-CM | POA: Diagnosis not present

## 2017-01-07 DIAGNOSIS — I10 Essential (primary) hypertension: Secondary | ICD-10-CM | POA: Diagnosis not present

## 2017-01-10 ENCOUNTER — Emergency Department (HOSPITAL_BASED_OUTPATIENT_CLINIC_OR_DEPARTMENT_OTHER)
Admission: EM | Admit: 2017-01-10 | Discharge: 2017-01-10 | Disposition: A | Discharge status: Home / Self Care | Payer: Medicare Other | Attending: Emergency Medicine | Admitting: Emergency Medicine

## 2017-01-10 ENCOUNTER — Encounter (HOSPITAL_BASED_OUTPATIENT_CLINIC_OR_DEPARTMENT_OTHER): Payer: Self-pay | Admitting: Emergency Medicine

## 2017-01-10 ENCOUNTER — Emergency Department (HOSPITAL_BASED_OUTPATIENT_CLINIC_OR_DEPARTMENT_OTHER): Payer: Medicare Other

## 2017-01-10 DIAGNOSIS — I10 Essential (primary) hypertension: Secondary | ICD-10-CM | POA: Insufficient documentation

## 2017-01-10 DIAGNOSIS — Z85828 Personal history of other malignant neoplasm of skin: Secondary | ICD-10-CM | POA: Diagnosis not present

## 2017-01-10 DIAGNOSIS — E039 Hypothyroidism, unspecified: Secondary | ICD-10-CM | POA: Diagnosis not present

## 2017-01-10 DIAGNOSIS — Y9301 Activity, walking, marching and hiking: Secondary | ICD-10-CM | POA: Diagnosis not present

## 2017-01-10 DIAGNOSIS — S0219XA Other fracture of base of skull, initial encounter for closed fracture: Secondary | ICD-10-CM | POA: Insufficient documentation

## 2017-01-10 DIAGNOSIS — S0993XA Unspecified injury of face, initial encounter: Secondary | ICD-10-CM | POA: Diagnosis not present

## 2017-01-10 DIAGNOSIS — Y929 Unspecified place or not applicable: Secondary | ICD-10-CM | POA: Diagnosis not present

## 2017-01-10 DIAGNOSIS — Z91010 Allergy to peanuts: Secondary | ICD-10-CM | POA: Diagnosis not present

## 2017-01-10 DIAGNOSIS — J45909 Unspecified asthma, uncomplicated: Secondary | ICD-10-CM | POA: Diagnosis not present

## 2017-01-10 DIAGNOSIS — Z79899 Other long term (current) drug therapy: Secondary | ICD-10-CM | POA: Insufficient documentation

## 2017-01-10 DIAGNOSIS — S0083XA Contusion of other part of head, initial encounter: Secondary | ICD-10-CM | POA: Insufficient documentation

## 2017-01-10 DIAGNOSIS — S4992XA Unspecified injury of left shoulder and upper arm, initial encounter: Secondary | ICD-10-CM | POA: Diagnosis not present

## 2017-01-10 DIAGNOSIS — W0110XA Fall on same level from slipping, tripping and stumbling with subsequent striking against unspecified object, initial encounter: Secondary | ICD-10-CM | POA: Diagnosis not present

## 2017-01-10 DIAGNOSIS — S0990XA Unspecified injury of head, initial encounter: Secondary | ICD-10-CM | POA: Diagnosis not present

## 2017-01-10 DIAGNOSIS — Y999 Unspecified external cause status: Secondary | ICD-10-CM | POA: Diagnosis not present

## 2017-01-10 NOTE — ED Triage Notes (Signed)
Patient reports that she was walking and tripped and fell on the curb  - patient denies any LOC - left shoulder pain and left forehead pain. Patient is A/O x 3

## 2017-01-10 NOTE — ED Provider Notes (Signed)
[place] DEPT [facility] Provider Note   CSN: [id number] Arrival date & time: 01/10/17  1504     History   Chief Complaint Chief Complaint  Patient presents with  . Fall    HPI Mary Roth is a 83 y.o. female.  Patient is an 83 year old elderly female who is not on anticoagulation therapy presenting today after mechanical fall. She was getting out of the car and her cane got stuck on the curb causing her to fall forward onto her left face and shoulder. She was able to stand and ambulate after that and denies any loss of consciousness. She denies any visual changes.   The history is provided by the patient.  Fall  This is a new problem. The current episode started 1 to 2 hours ago. The problem occurs constantly. The problem has not changed since onset.Pertinent negatives include no chest pain, no abdominal pain, no headaches and no shortness of breath. Associated symptoms comments: Left-sided facial pain with swelling around the eye and left shoulder pain. No hip, knee or ankle pain. No neck pain. No loss of consciousness or headache.. Exacerbated by: Lifting the arm or touching the area on her face. Nothing relieves the symptoms. She has tried nothing for the symptoms. The treatment provided no relief.    Past Medical History:  Diagnosis Date  . Abnormal liver function test   . Alpha galactosidase deficiency   . Arthritis   . Asthma   . Chest pain    a. 06/2013 [facility] MV: no ischemia/infarct;  b. 12/2015 MV ([place]): EF 82%, no ischemia.  . DDD (degenerative disc disease), lumbar   . DDD (degenerative disc disease), lumbosacral   . Esophageal reflux   . Hepatic steatosis   . Mastodynia   . Murmur   . Osteopenia 03/2012   T score -1.4 FRAX 20%/11%; Dr Phineas Real  . Other and unspecified hyperlipidemia   . Personal history of colonic polyps 08/21/2006   ADENOMATOUS POLYP  . Progressive Dyspnea    a. 06/2013 Echo: EF 55-60%, Gr1 DD;  b. 03/2014 CPX testing: indeterminate cv  response due to suboptimal cv stress load and blunted chronotropic response. Nl pulm response, nl PFTs;  c. 12/2015 Echo [facility]): EF 55-60%, no significant valve dzs.  [name] Spinal stenosis   . Unspecified asthma(493.90) 2003    no childhood asthma  . Unspecified essential hypertension   . Unspecified hypothyroidism     Patient Active Problem List   Diagnosis Date Noted  . Cough, persistent 09/03/2016  . Moderate persistent asthma 02/04/2016  . Acute bronchitis 02/04/2016  . Food allergy, alpha-gal hypersensitivity 01/01/2016  . Perennial and seasonal allergic rhinitis 12/05/2015  . Allergic reaction 11/07/2015  . Mild intermittent asthma 11/07/2015  . History of insect sting allergy 11/07/2015  . Angioedema 11/07/2015  . Palpitations 02/13/2015  . Rotator cuff tear arthropathy of right shoulder 08/14/2014  . Spondylolisthesis of lumbosacral region 12/20/2013  . Lichen planus 14/43/1540  . Benign paroxysmal positional vertigo 08/20/2013  . Severe obesity (BMI >= 40) ([place]) 05/07/2013  . Asthma with acute exacerbation 02/07/2013  . OSTEOPENIA 05/19/2010  . MASTODYNIA 05/17/2009  . Hyperlipidemia 01/12/2009  . Essential hypertension 01/12/2009  . SKIN CANCER, HX OF 01/12/2009  . COLONIC POLYPS, HX OF 01/12/2009  . GERD 10/12/2007  . Asthma 10/07/2007  . EDEMA- LOCALIZED 10/07/2007  . Hypothyroidism 01/15/2007    Past Surgical History:  Procedure Laterality Date  . APPENDECTOMY    . CHOLECYSTECTOMY  1973  for stones  . TONSILLECTOMY    . UTERINE SUSPENSION  [address] , [place]    [place] History    Gravida Para Term Preterm AB Living   2 2 2     2    SAB TAB Ectopic Multiple Live Births                   Home Medications    Prior to Admission medications   Medication Sig Start Date End Date Taking? Authorizing Provider  albuterol (PROAIR HFA) 108 (90 Base) MCG/ACT inhaler Inhale into the lungs every 6 (six) hours as needed for wheezing or shortness of breath.     [provider]  albuterol (PROVENTIL) (2.5 MG/3ML) 0.083% nebulizer solution Take 3 mLs (2.5 mg total) by nebulization every 4 (four) hours as needed for wheezing. 09/03/16   Bobbitt, Sedalia Muta, MD  amLODipine (NORVASC) 2.5 MG tablet Take 1 tablet (2.5 mg total) by mouth daily. 05/14/16   Rogelia Mire, NP  cetirizine (ZYRTEC ALLERGY) 10 MG tablet Take 10 mg by mouth daily.    [provider]  EPINEPHrine (EPIPEN 2-PAK) 0.3 mg/0.3 mL IJ SOAJ injection Inject 0.3 mLs (0.3 mg total) into the muscle once. 11/07/15   Bobbitt, Sedalia Muta, MD  fexofenadine (ALLEGRA) 180 MG tablet Take 180 mg by mouth daily as needed (asthma).     [provider]  fluticasone (FLONASE) 50 MCG/ACT nasal spray Place into the nose.    [provider]  Fluticasone-Salmeterol (ADVAIR DISKUS) 250-50 MCG/DOSE AEPB Inhale 1 puff into the lungs 2 (two) times daily. Patient taking differently: Inhale 1 puff into the lungs at bedtime.  02/04/16   Charlies Silvers, MD  furosemide (LASIX) 20 MG tablet Take 20 mg by mouth daily.    [provider]  levothyroxine (SYNTHROID, LEVOTHROID) 88 MCG tablet Take by mouth. 07/24/15   [provider]  montelukast (SINGULAIR) 10 MG tablet Take 1 tablet (10 mg total) by mouth at bedtime. 02/04/16   Charlies Silvers, MD  nebivolol (BYSTOLIC) 5 MG tablet Take 1 tablet (5 mg total) by mouth daily. 05/14/16   Rogelia Mire, NP  oseltamivir (TAMIFLU) 75 MG capsule TAKE ONE CAPSULE EVERY 12 HOURS HOURS FOR FIVE DAYS 06/16/16   Charlies Silvers, MD  predniSONE (STERAPRED UNI-PAK 21 TAB) 10 MG (21) TBPK tablet Take 1 tablet (10 mg total) by mouth daily. Take 1-5 tablets by mouth daily as directed. 05/14/16   Rogelia Mire, NP  rOPINIRole (REQUIP) 0.25 MG tablet Take 1 tablet (0.25 mg total) by mouth at bedtime. 1 pill 2 hrs pre bedtime 05/01/14   Hendricks Limes, MD  rosuvastatin (CRESTOR) 5 MG tablet Take 1 tablet (5 mg total) by mouth  at bedtime. 05/14/16   Rogelia Mire, NP  spironolactone (ALDACTONE) 25 MG tablet Take 0.5 tablets (12.5 mg total) by mouth daily. 11/10/16   Almyra Deforest, PA    Family History Family History  Problem Relation Age of Onset  . Uterine cancer Mother   . Ovarian cancer Mother   . Eczema Mother   . Heart attack Father 74  . Prostate cancer Father   . Breast cancer Sister        x 3 sisters  . Asthma Sister   . Food Allergy Sister   . Alcohol abuse Brother        x 3  . Prostate cancer Brother        x  3 brothers  . Pancreatic cancer Brother        x 2  . Diabetes Neg Hx   . Stroke Neg Hx   . Allergic rhinitis Neg Hx   . Urticaria Neg Hx     Social History Social History  Substance Use Topics  . Smoking status: Never Smoker  . Smokeless tobacco: Never Used  . Alcohol use 2.4 oz/week    4 Shots of liquor per week     Comment:  socially     Allergies   Beef extract; Lambs quarters; Other; Peanut oil; Poractant alfa; Pork-derived products; and Phenergan [promethazine hcl]   Review of Systems Review of Systems  Respiratory: Negative for shortness of breath.   Cardiovascular: Negative for chest pain.  Gastrointestinal: Negative for abdominal pain.  Neurological: Negative for headaches.  All other systems reviewed and are negative.    Physical Exam Updated Vital Signs BP (!) 129/59 (BP Location: Left Arm)   Pulse 61   Temp 98.2 F (36.8 C) (Oral)   Resp 18   Ht 4\' 10"  (1.473 m)   Wt 89.4 kg (197 lb)   SpO2 97%   BMI 41.17 kg/m   Physical Exam  Constitutional: She is oriented to person, place, and time. She appears well-developed and well-nourished. No distress.  HENT:  Head: Normocephalic. Head is with abrasion and with contusion.    Mouth/Throat: Oropharynx is clear and moist.  Eyes: Pupils are equal, round, and reactive to light. Conjunctivae and EOM are normal.  Neck: Normal range of motion. Neck supple. No spinous process tenderness and no muscular  tenderness present. Normal range of motion present.  Cardiovascular: Normal rate, regular rhythm and intact distal pulses.   No murmur heard. Pulmonary/Chest: Effort normal and breath sounds normal. No respiratory distress. She has no wheezes. She has no rales.  Abdominal: Soft. She exhibits no distension. There is no tenderness. There is no rebound and no guarding.  Musculoskeletal: She exhibits no edema.       Left shoulder: She exhibits decreased range of motion, tenderness, bony tenderness and pain. She exhibits no swelling, no effusion, no deformity and normal pulse.       Left elbow: Normal.       Right wrist: Normal.       Left wrist: Normal.       Right hip: Normal.       Left hip: Normal.       Legs: Neurological: She is alert and oriented to person, place, and time.  Skin: Skin is warm and dry. No rash noted. No erythema.  Psychiatric: She has a normal mood and affect. Her behavior is normal.  Nursing note and vitals reviewed.    ED Treatments / Results  Labs (all labs ordered are listed, but only abnormal results are displayed) Labs Reviewed - No data to display  EKG  EKG Interpretation None       Radiology Ct Head Wo Contrast  Result Date: 01/10/2017 CLINICAL DATA:  Left periorbital abrasion after falling today on a curb and landing on the left side of her face and shoulder. EXAM: CT HEAD WITHOUT CONTRAST CT MAXILLOFACIAL WITHOUT CONTRAST TECHNIQUE: Multidetector CT imaging of the head and maxillofacial structures were performed using the standard protocol without intravenous contrast. Multiplanar CT image reconstructions of the maxillofacial structures were also generated. COMPARISON:  None. FINDINGS: CT HEAD FINDINGS Brain: Diffusely enlarged ventricles and subarachnoid spaces. No intracranial hemorrhage, mass lesion or CT evidence of acute infarction. Vascular:  No hyperdense vessel or unexpected calcification. Skull: Mild bilateral hyperostosis frontalis.  No skull  fracture. Other: None. CT MAXILLOFACIAL FINDINGS Osseous: Small fracture in the lateral aspect of the left frontal sinus, extending into the superior aspect of the left orbit. This is nondisplaced. No other fractures are seen. Upper cervical spine degenerative changes. Orbits: Status post bilateral cataract extraction. No intraorbital hematoma seen. Sinuses: Small left maxillary sinus retention cyst. Minimal left sphenoid sinus mucosal thickening. Soft tissues: Left supraorbital soft tissue hematoma overlying the small fracture. IMPRESSION: 1. Small fracture in the lateral aspect of the left frontal sinus, extending into the superior aspect of the left orbit with an overlying supraorbital hematoma. 2. No intracranial hemorrhage. 3. Mild diffuse cerebral and cerebellar atrophy. 4. Minimal chronic left sphenoid sinusitis. Electronically Signed   By: Claudie Revering M.D.   On: 01/10/2017 17:11   Dg Shoulder Left  Result Date: 01/10/2017 CLINICAL DATA:  Status post fall 1 hour ago EXAM: LEFT SHOULDER - 2+ VIEW COMPARISON:  None. FINDINGS: There is no evidence of fracture or dislocation. There is no evidence of arthropathy or other focal bone abnormality. Soft tissues are unremarkable. IMPRESSION: Negative. Electronically Signed   By: Abelardo Diesel M.D.   On: 01/10/2017 17:07   Ct Maxillofacial Wo Contrast  Result Date: 01/10/2017 CLINICAL DATA:  Left periorbital abrasion after falling today on a curb and landing on the left side of her face and shoulder. EXAM: CT HEAD WITHOUT CONTRAST CT MAXILLOFACIAL WITHOUT CONTRAST TECHNIQUE: Multidetector CT imaging of the head and maxillofacial structures were performed using the standard protocol without intravenous contrast. Multiplanar CT image reconstructions of the maxillofacial structures were also generated. COMPARISON:  None. FINDINGS: CT HEAD FINDINGS Brain: Diffusely enlarged ventricles and subarachnoid spaces. No intracranial hemorrhage, mass lesion or CT evidence of  acute infarction. Vascular: No hyperdense vessel or unexpected calcification. Skull: Mild bilateral hyperostosis frontalis.  No skull fracture. Other: None. CT MAXILLOFACIAL FINDINGS Osseous: Small fracture in the lateral aspect of the left frontal sinus, extending into the superior aspect of the left orbit. This is nondisplaced. No other fractures are seen. Upper cervical spine degenerative changes. Orbits: Status post bilateral cataract extraction. No intraorbital hematoma seen. Sinuses: Small left maxillary sinus retention cyst. Minimal left sphenoid sinus mucosal thickening. Soft tissues: Left supraorbital soft tissue hematoma overlying the small fracture. IMPRESSION: 1. Small fracture in the lateral aspect of the left frontal sinus, extending into the superior aspect of the left orbit with an overlying supraorbital hematoma. 2. No intracranial hemorrhage. 3. Mild diffuse cerebral and cerebellar atrophy. 4. Minimal chronic left sphenoid sinusitis. Electronically Signed   By: Claudie Revering M.D.   On: 01/10/2017 17:11    Procedures Procedures (including critical care time)  Medications Ordered in ED Medications - No data to display   Initial Impression / Assessment and Plan / ED Course  I have reviewed the triage vital signs and the nursing notes.  Pertinent labs & imaging results that were available during my care of the patient were reviewed by me and considered in my medical decision making (see chart for details).     Patient presents today after mechanical fall with trauma to the left side of her face and shoulder. She has no neck pain and did not hit her head. She denies any LOC and does not take anticoagulation. She has no eye involvement and mouth and teeth are within normal limits. Patient was able to stimulate without difficulty and only minimal bruising on her knees.  CT of the head, face and shoulder imaging pending. Patient refused pain medication.  CT with small fx in frontal sinus  and superior orbit.  No intracranial bleed and no other acute findings.   Final Clinical Impressions(s) / ED Diagnoses   Final diagnoses:  Closed fracture of frontal sinus, initial encounter ([place])  Facial contusion, initial encounter    New Prescriptions New Prescriptions   No medications on file     Blanchie Dessert, MD 01/10/17 2352

## 2017-01-10 NOTE — ED Notes (Signed)
Pt returned from radiology.

## 2017-01-10 NOTE — Discharge Instructions (Signed)
Use tylenol as needed for pain.  Follow up with your doctor for any vision changes.

## 2017-01-16 DIAGNOSIS — M5030 Other cervical disc degeneration, unspecified cervical region: Secondary | ICD-10-CM | POA: Diagnosis not present

## 2017-01-16 DIAGNOSIS — E039 Hypothyroidism, unspecified: Secondary | ICD-10-CM | POA: Diagnosis not present

## 2017-01-16 DIAGNOSIS — S46012A Strain of muscle(s) and tendon(s) of the rotator cuff of left shoulder, initial encounter: Secondary | ICD-10-CM | POA: Diagnosis not present

## 2017-01-16 DIAGNOSIS — M25512 Pain in left shoulder: Secondary | ICD-10-CM | POA: Diagnosis not present

## 2017-01-16 DIAGNOSIS — Y998 Other external cause status: Secondary | ICD-10-CM | POA: Diagnosis not present

## 2017-01-16 DIAGNOSIS — M75102 Unspecified rotator cuff tear or rupture of left shoulder, not specified as traumatic: Secondary | ICD-10-CM | POA: Diagnosis not present

## 2017-01-16 DIAGNOSIS — I10 Essential (primary) hypertension: Secondary | ICD-10-CM | POA: Diagnosis not present

## 2017-01-16 DIAGNOSIS — S4992XA Unspecified injury of left shoulder and upper arm, initial encounter: Secondary | ICD-10-CM | POA: Diagnosis not present

## 2017-01-16 DIAGNOSIS — M25412 Effusion, left shoulder: Secondary | ICD-10-CM | POA: Diagnosis not present

## 2017-01-16 DIAGNOSIS — M19012 Primary osteoarthritis, left shoulder: Secondary | ICD-10-CM | POA: Diagnosis not present

## 2017-01-16 DIAGNOSIS — J45909 Unspecified asthma, uncomplicated: Secondary | ICD-10-CM | POA: Diagnosis not present

## 2017-01-16 DIAGNOSIS — W1839XA Other fall on same level, initial encounter: Secondary | ICD-10-CM | POA: Diagnosis not present

## 2017-01-16 DIAGNOSIS — E7800 Pure hypercholesterolemia, unspecified: Secondary | ICD-10-CM | POA: Diagnosis not present

## 2017-02-04 ENCOUNTER — Other Ambulatory Visit: Payer: Self-pay | Admitting: Allergy

## 2017-02-04 MED ORDER — MONTELUKAST SODIUM 10 MG PO TABS: 10.0000 mg | ORAL_TABLET | Freq: Every day | ORAL | 5 refills | Status: DC

## 2017-02-06 ENCOUNTER — Ambulatory Visit: Payer: Medicare Other | Admitting: Cardiovascular Disease

## 2017-02-17 ENCOUNTER — Other Ambulatory Visit: Payer: Self-pay | Admitting: Allergy and Immunology

## 2017-02-17 DIAGNOSIS — J4540 Moderate persistent asthma, uncomplicated: Secondary | ICD-10-CM | POA: Diagnosis not present

## 2017-02-17 DIAGNOSIS — T7840XA Allergy, unspecified, initial encounter: Secondary | ICD-10-CM

## 2017-04-06 DIAGNOSIS — K219 Gastro-esophageal reflux disease without esophagitis: Secondary | ICD-10-CM | POA: Diagnosis not present

## 2017-04-06 DIAGNOSIS — J4540 Moderate persistent asthma, uncomplicated: Secondary | ICD-10-CM | POA: Diagnosis not present

## 2017-04-06 DIAGNOSIS — J181 Lobar pneumonia, unspecified organism: Secondary | ICD-10-CM | POA: Diagnosis not present

## 2017-06-07 ENCOUNTER — Other Ambulatory Visit: Payer: Self-pay | Admitting: Nurse Practitioner

## 2017-06-08 NOTE — Telephone Encounter (Signed)
Rx request sent to pharmacy.  

## 2017-06-24 DIAGNOSIS — J449 Chronic obstructive pulmonary disease, unspecified: Secondary | ICD-10-CM | POA: Diagnosis not present

## 2017-06-24 DIAGNOSIS — Z87440 Personal history of urinary (tract) infections: Secondary | ICD-10-CM | POA: Diagnosis not present

## 2017-06-24 DIAGNOSIS — N390 Urinary tract infection, site not specified: Secondary | ICD-10-CM | POA: Diagnosis not present

## 2017-06-24 DIAGNOSIS — I440 Atrioventricular block, first degree: Secondary | ICD-10-CM | POA: Diagnosis not present

## 2017-06-24 DIAGNOSIS — E785 Hyperlipidemia, unspecified: Secondary | ICD-10-CM | POA: Diagnosis not present

## 2017-06-24 DIAGNOSIS — Z8679 Personal history of other diseases of the circulatory system: Secondary | ICD-10-CM | POA: Diagnosis not present

## 2017-06-24 DIAGNOSIS — R918 Other nonspecific abnormal finding of lung field: Secondary | ICD-10-CM | POA: Diagnosis not present

## 2017-06-24 DIAGNOSIS — Z79899 Other long term (current) drug therapy: Secondary | ICD-10-CM | POA: Diagnosis not present

## 2017-06-24 DIAGNOSIS — I10 Essential (primary) hypertension: Secondary | ICD-10-CM | POA: Diagnosis not present

## 2017-06-24 DIAGNOSIS — Z9889 Other specified postprocedural states: Secondary | ICD-10-CM | POA: Diagnosis not present

## 2017-06-24 DIAGNOSIS — R0789 Other chest pain: Secondary | ICD-10-CM | POA: Diagnosis not present

## 2017-06-24 DIAGNOSIS — R0602 Shortness of breath: Secondary | ICD-10-CM | POA: Diagnosis not present

## 2017-06-24 DIAGNOSIS — M79601 Pain in right arm: Secondary | ICD-10-CM | POA: Diagnosis not present

## 2017-06-24 DIAGNOSIS — I119 Hypertensive heart disease without heart failure: Secondary | ICD-10-CM | POA: Diagnosis not present

## 2017-06-24 DIAGNOSIS — Z8639 Personal history of other endocrine, nutritional and metabolic disease: Secondary | ICD-10-CM | POA: Diagnosis not present

## 2017-06-24 DIAGNOSIS — R079 Chest pain, unspecified: Secondary | ICD-10-CM | POA: Diagnosis not present

## 2017-06-24 DIAGNOSIS — R6884 Jaw pain: Secondary | ICD-10-CM | POA: Diagnosis not present

## 2017-06-25 DIAGNOSIS — R079 Chest pain, unspecified: Secondary | ICD-10-CM | POA: Diagnosis not present

## 2017-06-25 DIAGNOSIS — R918 Other nonspecific abnormal finding of lung field: Secondary | ICD-10-CM | POA: Diagnosis not present

## 2017-06-25 DIAGNOSIS — E785 Hyperlipidemia, unspecified: Secondary | ICD-10-CM | POA: Diagnosis not present

## 2017-06-25 DIAGNOSIS — R9431 Abnormal electrocardiogram [ECG] [EKG]: Secondary | ICD-10-CM | POA: Diagnosis not present

## 2017-06-25 DIAGNOSIS — R001 Bradycardia, unspecified: Secondary | ICD-10-CM | POA: Diagnosis not present

## 2017-06-25 DIAGNOSIS — N3000 Acute cystitis without hematuria: Secondary | ICD-10-CM | POA: Diagnosis not present

## 2017-06-25 DIAGNOSIS — I10 Essential (primary) hypertension: Secondary | ICD-10-CM | POA: Diagnosis not present

## 2017-06-25 DIAGNOSIS — I440 Atrioventricular block, first degree: Secondary | ICD-10-CM | POA: Diagnosis not present

## 2017-06-30 DIAGNOSIS — N183 Chronic kidney disease, stage 3 (moderate): Secondary | ICD-10-CM | POA: Diagnosis not present

## 2017-06-30 DIAGNOSIS — I129 Hypertensive chronic kidney disease with stage 1 through stage 4 chronic kidney disease, or unspecified chronic kidney disease: Secondary | ICD-10-CM | POA: Diagnosis not present

## 2017-06-30 DIAGNOSIS — R079 Chest pain, unspecified: Secondary | ICD-10-CM | POA: Diagnosis not present

## 2017-06-30 DIAGNOSIS — Z09 Encounter for follow-up examination after completed treatment for conditions other than malignant neoplasm: Secondary | ICD-10-CM | POA: Diagnosis not present

## 2017-06-30 DIAGNOSIS — E039 Hypothyroidism, unspecified: Secondary | ICD-10-CM | POA: Diagnosis not present

## 2017-06-30 DIAGNOSIS — Z87440 Personal history of urinary (tract) infections: Secondary | ICD-10-CM | POA: Diagnosis not present

## 2017-06-30 DIAGNOSIS — E782 Mixed hyperlipidemia: Secondary | ICD-10-CM | POA: Diagnosis not present

## 2017-07-01 DIAGNOSIS — J4540 Moderate persistent asthma, uncomplicated: Secondary | ICD-10-CM | POA: Diagnosis not present

## 2017-07-01 DIAGNOSIS — R918 Other nonspecific abnormal finding of lung field: Secondary | ICD-10-CM | POA: Diagnosis not present

## 2017-07-07 DIAGNOSIS — H524 Presbyopia: Secondary | ICD-10-CM | POA: Diagnosis not present

## 2017-07-07 DIAGNOSIS — Z961 Presence of intraocular lens: Secondary | ICD-10-CM | POA: Diagnosis not present

## 2017-07-07 NOTE — Progress Notes (Signed)
Cardiology Office Note   Date:  07/08/2017   ID:  [name], DOB 07/14/1928, MRN [id number]  PCP:  Margaretmary Bayley, MD  Cardiologist:  Dr. Claiborne Billings   Chief Complaint  Patient presents with  . Chest Pain  . Hypertension     History of Present Illness: Mary Roth is a 84 y.o. female who presents for ongoing assessment and management of chronic dyspnea, hypertension, grade 1 diastolic dysfunction. Last seen in the office by Almyra Deforest, [place] on 11/07/2016. Was felt that breathing she will noncardiac etiology. However when she was seen last, she was complaining of lower extremity edema and weakness. She had been instructed in additional low dose of Lasix and was followed up.  At that office visit the patient had labs completed to include a d-dimer and a BMET. An echocardiogram was also planned. Her lower extremity edema had greatly resolved on follow-up appointment with the additional dose of Lasix prior to coming  11/19/2016 Echo Left ventricle: The cavity size was normal. Systolic function was   normal. The estimated ejection fraction was in the range of 50%   to 55%. Wall motion was normal; there were no regional wall   motion abnormalities. Doppler parameters are consistent with   abnormal left ventricular relaxation (grade 1 diastolic   dysfunction). Doppler parameters are consistent with   indeterminate ventricular filling pressure. - Aortic valve: Transvalvular velocity was within the normal range.   There was no stenosis. There was trivial regurgitation. - Mitral valve: Transvalvular velocity was within the normal range.   There was no evidence for stenosis. There was trivial   regurgitation. - Right ventricle: The cavity size was normal. Wall thickness was   normal. Systolic function was normal. - Atrial septum: No defect or patent foramen ovale was identified   by color flow Doppler. - Tricuspid valve: There was trivial regurgitation. - Pulmonary arteries: Systolic  pressure was within the normal   range. PA peak pressure: 23 mm Hg (S).  The patient comes today with complaints recurrent chest pain, radiating to her back neck and shoulder. This occurred twice over the last 2 weeks. She was seen by her primary care physician who referred her back to cardiology for possible stress test.  She also states that approximately 10 days ago the patient had frank bleeding from her rectum. She states that she wears an incontinence pad she had when change her underwear, she saw blood on the incontinent pad. He has not reported this to her PCP.  Additionally, her husband has been treated for lung cancer, this is making her very emotional and tearful on discussing this. They're due to follow-up with oncologist at [facility] final results and the need to discontinue treatment.  Past Medical History:  Diagnosis Date  . Abnormal liver function test   . Alpha galactosidase deficiency   . Arthritis   . Asthma   . Chest pain    a. 06/2013 Lexi MV: no ischemia/infarct;  b. 12/2015 MV ([place]): EF 82%, no ischemia.  . DDD (degenerative disc disease), lumbar   . DDD (degenerative disc disease), lumbosacral   . Esophageal reflux   . Hepatic steatosis   . Mastodynia   . Murmur   . Osteopenia 03/2012   T score -1.4 FRAX 20%/11%; Dr Phineas Real  . Other and unspecified hyperlipidemia   . Personal history of colonic polyps 08/21/2006   ADENOMATOUS POLYP  . Progressive Dyspnea    a. 06/2013 Echo: EF 55-60%, Gr1 DD;  b. 03/2014 CPX testing: indeterminate cv response due to suboptimal cv stress load and blunted chronotropic response. Nl pulm response, nl PFTs;  c. 12/2015 Echo [facility]): EF 55-60%, no significant valve dzs.  [name] Spinal stenosis   . Unspecified asthma(493.90) 2003    no childhood asthma  . Unspecified essential hypertension   . Unspecified hypothyroidism     Past Surgical History:  Procedure Laterality Date  . APPENDECTOMY    . CHOLECYSTECTOMY  1973    for  stones  . TONSILLECTOMY    . UTERINE SUSPENSION  [address] , [place]     Current Outpatient Medications  Medication Sig Dispense Refill  . albuterol (PROAIR HFA) 108 (90 Base) MCG/ACT inhaler Inhale into the lungs every 6 (six) hours as needed for wheezing or shortness of breath.    [name] albuterol (PROVENTIL) (2.5 MG/3ML) 0.083% nebulizer solution Take 3 mLs (2.5 mg total) by nebulization every 4 (four) hours as needed for wheezing. 75 mL 1  . amLODipine (NORVASC) 2.5 MG tablet TAKE 1 TABLET BY MOUTH ONCE A DAY 30 tablet 10  . BYSTOLIC 5 MG tablet TAKE 1 TABLET (5 MG TOTAL) BY MOUTH DAILY. 30 tablet 10  . cetirizine (ZYRTEC ALLERGY) 10 MG tablet Take 10 mg by mouth daily.    . fexofenadine (ALLEGRA) 180 MG tablet Take 180 mg by mouth daily as needed (asthma).     . fluticasone (FLONASE) 50 MCG/ACT nasal spray Place into the nose.    . furosemide (LASIX) 20 MG tablet Take 20 mg by mouth daily.    [name] levothyroxine (SYNTHROID, LEVOTHROID) 88 MCG tablet Take by mouth.    . montelukast (SINGULAIR) 10 MG tablet Take 1 tablet (10 mg total) by mouth at bedtime. 30 tablet 5  . rOPINIRole (REQUIP) 0.25 MG tablet Take 1 tablet (0.25 mg total) by mouth at bedtime. 1 pill 2 hrs pre bedtime 30 tablet 2  . rosuvastatin (CRESTOR) 5 MG tablet TAKE 1 TABLET (5 MG TOTAL) BY MOUTH AT BEDTIME. 30 tablet 10  . spironolactone (ALDACTONE) 25 MG tablet Take 0.5 tablets (12.5 mg total) by mouth daily. 30 tablet 11   No current facility-administered medications for this visit.     Allergies:   Beef extract; Lambs quarters; Other; Peanut oil; Poractant alfa; Pork-derived products; and Phenergan [promethazine hcl]    Social History:  The patient  reports that  has never smoked. she has never used smokeless tobacco. She reports that she drinks about 2.4 oz of alcohol per week. She reports that she does not use drugs.   Family History:  The patient's family history includes Alcohol abuse in her brother; Asthma in her  sister; Breast cancer in her sister; Eczema in her mother; Food Allergy in her sister; Heart attack (age of onset: 61) in her father; Ovarian cancer in her mother; Pancreatic cancer in her brother; Prostate cancer in her brother and father; Uterine cancer in her mother.    ROS: All other systems are reviewed and negative. Unless otherwise mentioned in H&P    PHYSICAL EXAM: VS:  BP 116/68   Pulse 66   Ht 4\' 10"  (1.473 m)   Wt 190 lb 3.2 oz (86.3 kg)   SpO2 96%   BMI 39.75 kg/m  , BMI Body mass index is 39.75 kg/m. GEN: Well nourished, well developed, in no acute distress  HEENT: normal  Neck: no JVD, carotid bruits, or masses Cardiac: RRR; no murmurs, rubs, or gallops,no edema  Respiratory:  clear to auscultation bilaterally, normal work of breathing GI: soft, nontender, nondistended, + BS MS: no deformity or atrophy  Skin: warm and dry, no rash Neuro:  Strength and sensation are intact Psych: euthymic mood, full affect   EKG:  Heart rate of 61 bpm Normal sinus rhythm, nonspecific ST-T wave abnormalities, unchanged from prior EKG,  Recent Labs: 09/25/2016: ALT 27; Hemoglobin 13.2; Platelets 196 11/07/2016: BUN 16; Creatinine, Ser 0.94; Potassium 5.5; Sodium 143    Lipid Panel    Component Value Date/Time   CHOL 232 (H) 05/17/2014 0834   TRIG 133.0 05/17/2014 0834   HDL 64.80 05/17/2014 0834   CHOLHDL 4 05/17/2014 0834   VLDL 26.6 05/17/2014 0834   LDLCALC 141 (H) 05/17/2014 0834   LDLDIRECT 106.8 01/10/2011 1218      Wt Readings from Last 3 Encounters:  07/08/17 190 lb 3.2 oz (86.3 kg)  01/10/17 197 lb (89.4 kg)  11/07/16 192 lb (87.1 kg)      Other studies Reviewed: Echocardiogram 12/18/2016 Left ventricle: The cavity size was normal. Systolic function was   normal. The estimated ejection fraction was in the range of 50%   to 55%. Wall motion was normal; there were no regional wall   motion abnormalities. Doppler parameters are consistent with   abnormal left  ventricular relaxation (grade 1 diastolic   dysfunction). Doppler parameters are consistent with   indeterminate ventricular filling pressure. - Aortic valve: Transvalvular velocity was within the normal range.   There was no stenosis. There was trivial regurgitation. - Mitral valve: Transvalvular velocity was within the normal range.   There was no evidence for stenosis. There was trivial   regurgitation. - Right ventricle: The cavity size was normal. Wall thickness was   normal. Systolic function was normal. - Atrial septum: No defect or patent foramen ovale was identified   by color flow Doppler. - Tricuspid valve: There was trivial regurgitation. - Pulmonary arteries: Systolic pressure was within the normal   range. PA peak pressure: 23 mm Hg (S).  NM Stress Test 06/23/2013 Impression Exercise Capacity:  Lexiscan with no exercise. BP Response:  Normal blood pressure response. Clinical Symptoms:  No significant symptoms noted. ECG Impression:  No significant ECG changes with Lexiscan. Comparison with Prior Nuclear Study: No previous nuclear study performed  ASSESSMENT AND PLAN:  1.  Recurrent chest pain: Recent medicine stress test in 2015 was negative for ischemia. We'll repeat stress test for diagnostic purposes. The patient will continue her current medication regimen which includes statin therapy for secondary prevention.   2.  Hypertension: Continue amlodipine, bysystolic, and spironolactone.  3. Hypothyroidism: Stay on thyroid replacement.   4. Hypercholesterolemia; Continue statin therapy.  5. Situational stress: Causing a great deal of tearfulness and anxiety in this office visit. Has been diagnosed with lung cancer over a year ago has not seen to be progressing. Follow-up with oncology at [facility]. She is very anxious about the outcome.  6. Questionable GI Bleed: Had one episode of rectal bleeding about 10 days ago. I am ordering CBC. She will need to see GI  ASAP.   Current medicines are reviewed at length with the patient today.  I spent greater than 45 minutes with this patient going over all of her symptoms, reassuring her in her tearfulness concerning her husband's illness, and explaining the need to follow-up with GI and possible.  Labs/ tests ordered today include:  Phill Myron. [place] Pugh, ANP, AACC   07/08/2017 12:39 PM  [facility] [address] [place], [place] [address] Phone: [phone]; Fax: [phone]

## 2017-07-08 ENCOUNTER — Ambulatory Visit: Payer: Medicare Other | Admitting: Adult Health

## 2017-07-08 ENCOUNTER — Encounter: Payer: Self-pay | Admitting: Adult Health

## 2017-07-08 VITALS — BP 116/68 | HR 66 | Ht <= 58 in | Wt 190.2 lb

## 2017-07-08 DIAGNOSIS — Z79899 Other long term (current) drug therapy: Secondary | ICD-10-CM

## 2017-07-08 DIAGNOSIS — E7800 Pure hypercholesterolemia, unspecified: Secondary | ICD-10-CM

## 2017-07-08 DIAGNOSIS — R079 Chest pain, unspecified: Secondary | ICD-10-CM

## 2017-07-08 DIAGNOSIS — I10 Essential (primary) hypertension: Secondary | ICD-10-CM

## 2017-07-08 LAB — CBC
HEMATOCRIT: 40.5 % (ref 34.0–46.6)
Hemoglobin: 14 g/dL (ref 11.1–15.9)
MCH: 31.7 pg (ref 26.6–33.0)
MCHC: 34.6 g/dL (ref 31.5–35.7)
MCV: 92 fL (ref 79–97)
Platelets: 248 10*3/uL (ref 150–379)
RBC: 4.41 x10E6/uL (ref 3.77–5.28)
RDW: 13.6 % (ref 12.3–15.4)
WBC: 8.9 10*3/uL (ref 3.4–10.8)

## 2017-07-08 NOTE — Patient Instructions (Signed)
Medication Instructions:  NO CHANGES-Your physician recommends that you continue on your current medications as directed. Please refer to the Current Medication list given to you today.  If you need a refill on your cardiac medications before your next appointment, please call your pharmacy.  Labwork: CBC TODAY HERE IN OUR OFFICE AT [facility]  Testing/Procedures: Your physician has requested that you have en exercise stress myoview, A cardiac stress test is a cardiological test that measures the heart's ability to respond to external stress in a controlled clinical environment. . For further information please visit [facility]. Please follow instruction sheet, as given.  Follow-Up: Your physician wants you to follow-up in: [place] (NURSE PRACTIONIER), DNP -AND- THE 1ST AVAILABLE WITH DR Claiborne Billings.     Thank you for choosing [facility] at [facility]!!

## 2017-07-09 ENCOUNTER — Telehealth (HOSPITAL_COMMUNITY): Payer: Self-pay

## 2017-07-09 NOTE — Telephone Encounter (Signed)
Encounter complete. 

## 2017-07-14 ENCOUNTER — Ambulatory Visit (HOSPITAL_COMMUNITY)
Admission: RE | Admit: 2017-07-14 | Discharge: 2017-07-14 | Disposition: A | Discharge status: Home / Self Care | Payer: Medicare Other | Source: Ambulatory Visit | Attending: Internal Medicine | Admitting: Internal Medicine

## 2017-07-14 DIAGNOSIS — Z8249 Family history of ischemic heart disease and other diseases of the circulatory system: Secondary | ICD-10-CM | POA: Diagnosis not present

## 2017-07-14 DIAGNOSIS — J45909 Unspecified asthma, uncomplicated: Secondary | ICD-10-CM | POA: Insufficient documentation

## 2017-07-14 DIAGNOSIS — R079 Chest pain, unspecified: Secondary | ICD-10-CM | POA: Diagnosis not present

## 2017-07-14 DIAGNOSIS — E785 Hyperlipidemia, unspecified: Secondary | ICD-10-CM | POA: Insufficient documentation

## 2017-07-14 DIAGNOSIS — I129 Hypertensive chronic kidney disease with stage 1 through stage 4 chronic kidney disease, or unspecified chronic kidney disease: Secondary | ICD-10-CM | POA: Diagnosis not present

## 2017-07-14 DIAGNOSIS — E039 Hypothyroidism, unspecified: Secondary | ICD-10-CM | POA: Insufficient documentation

## 2017-07-14 DIAGNOSIS — E669 Obesity, unspecified: Secondary | ICD-10-CM | POA: Diagnosis not present

## 2017-07-14 DIAGNOSIS — N183 Chronic kidney disease, stage 3 (moderate): Secondary | ICD-10-CM | POA: Insufficient documentation

## 2017-07-14 DIAGNOSIS — R011 Cardiac murmur, unspecified: Secondary | ICD-10-CM | POA: Insufficient documentation

## 2017-07-14 LAB — MYOCARDIAL PERFUSION IMAGING
CHL CUP NUCLEAR SDS: 2
CHL CUP NUCLEAR SRS: 1
CHL CUP NUCLEAR SSS: 3
CSEPPHR: 94 {beats}/min
LV dias vol: 71 mL (ref 46–106)
LV sys vol: 29 mL
Rest HR: 57 {beats}/min
TID: 1.13

## 2017-07-14 MED ORDER — REGADENOSON 0.4 MG/5ML IV SOLN
0.4000 mg | Freq: Once | INTRAVENOUS | Status: AC
  Administered 2017-07-14: 0.4 mg via INTRAVENOUS

## 2017-07-14 MED ORDER — TECHNETIUM TC 99M TETROFOSMIN IV KIT
29.5000 | PACK | Freq: Once | INTRAVENOUS | Status: AC | PRN
  Administered 2017-07-14: 29.5 via INTRAVENOUS
  Filled 2017-07-14: qty 30

## 2017-07-14 MED ORDER — TECHNETIUM TC 99M TETROFOSMIN IV KIT
10.7000 | PACK | Freq: Once | INTRAVENOUS | Status: AC | PRN
  Administered 2017-07-14: 10.7 via INTRAVENOUS
  Filled 2017-07-14: qty 11

## 2017-07-17 ENCOUNTER — Other Ambulatory Visit: Payer: Self-pay | Admitting: Cardiovascular Disease

## 2017-07-17 NOTE — Telephone Encounter (Signed)
REFILL 

## 2017-08-04 DIAGNOSIS — J45901 Unspecified asthma with (acute) exacerbation: Secondary | ICD-10-CM | POA: Diagnosis not present

## 2017-08-04 DIAGNOSIS — R05 Cough: Secondary | ICD-10-CM | POA: Diagnosis not present

## 2017-08-04 DIAGNOSIS — J40 Bronchitis, not specified as acute or chronic: Secondary | ICD-10-CM | POA: Diagnosis not present

## 2017-08-06 ENCOUNTER — Ambulatory Visit: Payer: Medicare Other | Admitting: Adult Health

## 2017-08-16 ENCOUNTER — Other Ambulatory Visit: Payer: Self-pay | Admitting: Pediatrics

## 2017-08-17 NOTE — Telephone Encounter (Signed)
Refill for Singulair given x 1 as a courtesy with no refills, pt needs an OV

## 2017-08-26 ENCOUNTER — Telehealth: Payer: Self-pay | Admitting: Cardiovascular Disease

## 2017-08-26 NOTE — Telephone Encounter (Signed)
Called to confirm, patient cancelled and rescheduled for 6/26 at 2pm

## 2017-08-26 NOTE — Telephone Encounter (Signed)
Pt called and left message cancelling her appointment with Dr. Claiborne Billings on 08/28/17 at 3:40 p.m. Please address

## 2017-08-28 ENCOUNTER — Other Ambulatory Visit: Payer: Self-pay

## 2017-08-28 ENCOUNTER — Ambulatory Visit: Payer: Medicare Other | Admitting: Cardiovascular Disease

## 2017-08-28 NOTE — Patient Outreach (Signed)
[place] [facility]) Care Management  08/28/2017  LAELYNN BLIZZARD February 07, 1929 [id number]   Medication Adherence call to Mrs. Teddy Spike patient is showing past due under [facility] Ins.on  Rosuvastatin 5 mg patient did not answer  left a message for patient to call back.  [place] Management Direct Dial [phone]  Fax [phone] [email or web address]

## 2017-09-04 ENCOUNTER — Telehealth: Payer: Self-pay | Admitting: Cardiovascular Disease

## 2017-09-04 NOTE — Telephone Encounter (Signed)
Pt c/o of Chest Pain: STAT if CP now or developed within 24 hours  1. Are you having CP right now?  no 2. Are you experiencing any other symptoms (ex. SOB, nausea, vomiting, sweating)? no   3. How long have you been experiencing CP? Last night   4. Is your CP continuous or coming and going? Continuous, from the from and back of her chest and her left jaw   5. Have you taken Nitroglycerin? Pt took 4 of them in 2.5 hours ?

## 2017-09-04 NOTE — Telephone Encounter (Signed)
Received call from patient.She stated she had chest pain last night that radiated into left jaw.Stated pain lasted appox 2 to 3 hours.She took 4 NTG without relief.Stated she has not had any pain today,just feels weak.Appointment scheduled with Rosaria Ferries PA 09/11/17 at 2:00 pm.Advised to go to ED if she has any more chest pain.

## 2017-09-11 ENCOUNTER — Ambulatory Visit: Payer: Medicare Other | Admitting: Physician Assistant

## 2017-09-11 ENCOUNTER — Encounter: Payer: Self-pay | Admitting: Physician Assistant

## 2017-09-11 VITALS — BP 132/80 | HR 73 | Ht <= 58 in | Wt 187.0 lb

## 2017-09-11 DIAGNOSIS — R600 Localized edema: Secondary | ICD-10-CM

## 2017-09-11 DIAGNOSIS — I5032 Chronic diastolic (congestive) heart failure: Secondary | ICD-10-CM | POA: Diagnosis not present

## 2017-09-11 DIAGNOSIS — R079 Chest pain, unspecified: Secondary | ICD-10-CM | POA: Diagnosis not present

## 2017-09-11 DIAGNOSIS — I10 Essential (primary) hypertension: Secondary | ICD-10-CM | POA: Diagnosis not present

## 2017-09-11 MED ORDER — FUROSEMIDE 20 MG PO TABS: 20.0000 mg | ORAL_TABLET | Freq: Every day | ORAL | 3 refills | Status: DC

## 2017-09-11 MED ORDER — NITROGLYCERIN 0.4 MG SL SUBL: 0.4000 mg | SUBLINGUAL_TABLET | SUBLINGUAL | 3 refills | Status: DC | PRN

## 2017-09-11 NOTE — Patient Instructions (Addendum)
Medication Instructions:  No medication changes Lasix refill sent to [facility].  If you need a refill on your cardiac medications before your next appointment, please call your pharmacy.  Labwork: None ordered.  Testing/Procedures: None ordered.  Special Instructions: Limit Sodium to 500 mg per meal. Drink at least a quart of water a day.   Follow-Up: Your physician wants you to follow-up: first available with Dr.Kelly. You should receive a reminder letter in the mail two months in advance. If you do not receive a letter, please call our office [phone].  Thank you for choosing [facility] at [facility]!!

## 2017-09-11 NOTE — Progress Notes (Signed)
Cardiology Office Note   Date:  09/11/2017   ID:  Mary Roth, DOB October 18, 1928, MRN [id number]  PCP:  Margaretmary Bayley, MD  Cardiologist: Dr. Claiborne Billings, 02/13/2015 D. Purcell Nails, DNP, 07/08/2017 Rosaria Ferries, PA-C   Chief Complaint  Patient presents with  . office visit    x1 week ago during the night pain in the middle of chest and back area, left jaw pain, took 4 nitro's pain did go away after that, swelling in left leg noted, SOB can be caused due to asthma    History of Present Illness: Mary Roth is a 84 y.o. female with a history of D-CHF w/ grade 1 dd on echo, HTN, HLD, COPD, Asthma.  07/08/2017 office visit, patient complaining of chest pain and Myoview ordered, BRBPR and CBC ordered, family stress due to husband's lung cancer, possibly nearing end of treatment.  Patient needs to see GI  4/26 phone notes regarding chest pain going into her jaw, not relieved by nitroglycerin, appointment made  Mary Roth presents for cardiology follow up.  Tuesday a week ago, about 11:30 pm, she had sudden onset of pain that was in her center chest and went around to her back. The pain was worse in her back. Worse with deep inspiration. She was breathing better sitting up. Took 4 sl NTG 15 minutes apart, no change in pain. Sx resolved in about 2-1/2 hours, gone by about 2 am.   The next day, she was very tired. That was the 3rd episode.  The first time, she called EMS from home. She got SL NTG by EMS and it helped the pain. That was about a year ago.  2nd episode happened a couple of months ago, when she saw her PCP a few days later, was sent to the ER. MI ruled out.   She has noticed LE edema for the last 10 days, after the last episode. She was having similar sx a year ago, LE Dopplers were negative for DVT.  Upon review of her medication list, she realized that she is out of Lasix and has not been taking it recently  Upon further review of her medication list she revealed that  the nitroglycerin she had taken the other night was her husband and it was at least a year old.   Past Medical History:  Diagnosis Date  . Abnormal liver function test   . Alpha galactosidase deficiency   . Arthritis   . Asthma   . Chest pain    a. 06/2013 Lexi MV: no ischemia/infarct;  b. 12/2015 MV ([place]): EF 82%, no ischemia.  . DDD (degenerative disc disease), lumbar   . DDD (degenerative disc disease), lumbosacral   . Esophageal reflux   . Hepatic steatosis   . Mastodynia   . Murmur   . Osteopenia 03/2012   T score -1.4 FRAX 20%/11%; Dr Phineas Real  . Other and unspecified hyperlipidemia   . Personal history of colonic polyps 08/21/2006   ADENOMATOUS POLYP  . Progressive Dyspnea    a. 06/2013 Echo: EF 55-60%, Gr1 DD;  b. 03/2014 CPX testing: indeterminate cv response due to suboptimal cv stress load and blunted chronotropic response. Nl pulm response, nl PFTs;  c. 12/2015 Echo [facility]): EF 55-60%, no significant valve dzs.  [name] Spinal stenosis   . Unspecified asthma(493.90) 2003    no childhood asthma  . Unspecified essential hypertension   . Unspecified hypothyroidism     Past Surgical History:  Procedure  Laterality Date  . APPENDECTOMY    . CHOLECYSTECTOMY  1973    for stones  . TONSILLECTOMY    . UTERINE SUSPENSION  [address] , [place]    Current Outpatient Medications  Medication Sig Dispense Refill  . albuterol (PROAIR HFA) 108 (90 Base) MCG/ACT inhaler Inhale into the lungs every 6 (six) hours as needed for wheezing or shortness of breath.    [name] albuterol (PROVENTIL) (2.5 MG/3ML) 0.083% nebulizer solution Take 3 mLs (2.5 mg total) by nebulization every 4 (four) hours as needed for wheezing. 75 mL 1  . amLODipine (NORVASC) 2.5 MG tablet TAKE 1 TABLET BY MOUTH ONCE A DAY 30 tablet 10  . BYSTOLIC 5 MG tablet TAKE 1 TABLET (5 MG TOTAL) BY MOUTH DAILY. 30 tablet 10  . cetirizine (ZYRTEC ALLERGY) 10 MG tablet Take 10 mg by mouth daily.    . fexofenadine (ALLEGRA)  180 MG tablet Take 180 mg by mouth daily as needed (asthma).     . fluticasone (FLONASE) 50 MCG/ACT nasal spray Place into the nose.    . levothyroxine (SYNTHROID, LEVOTHROID) 88 MCG tablet Take by mouth.    . montelukast (SINGULAIR) 10 MG tablet TAKE ONE TABLET BY MOUTH AT BEDTIME  30 tablet 0  . rOPINIRole (REQUIP) 0.25 MG tablet Take 1 tablet (0.25 mg total) by mouth at bedtime. 1 pill 2 hrs pre bedtime 30 tablet 2  . rosuvastatin (CRESTOR) 5 MG tablet TAKE 1 TABLET (5 MG TOTAL) BY MOUTH AT BEDTIME. 30 tablet 10  . spironolactone (ALDACTONE) 25 MG tablet TAKE ONE TABLET BY MOUTH ONE TIME DAILY  60 tablet 6  . furosemide (LASIX) 20 MG tablet Take 20 mg by mouth daily.     No current facility-administered medications for this visit.     Allergies:   Beef extract; Lambs quarters; Other; Poractant alfa; Pork-derived products; and Phenergan [promethazine hcl]    Social History:  The patient  reports that she has never smoked. She has never used smokeless tobacco. She reports that she drinks about 2.4 oz of alcohol per week. She reports that she does not use drugs.   Family History:  The patient's family history includes Alcohol abuse in her brother; Asthma in her sister; Breast cancer in her sister; Eczema in her mother; Food Allergy in her sister; Heart attack (age of onset: 39) in her father; Ovarian cancer in her mother; Pancreatic cancer in her brother; Prostate cancer in her brother and father; Uterine cancer in her mother.    ROS:  Please see the history of present illness. All other systems are reviewed and negative.    PHYSICAL EXAM: VS:  BP 132/80 (BP Location: Left Arm)   Pulse 73   Ht 4\' 10"  (1.473 m)   Wt 187 lb (84.8 kg)   BMI 39.08 kg/m  , BMI Body mass index is 39.08 kg/m. GEN: Well nourished, well developed, female in no acute distress  HEENT: normal for age  Neck: Minimal JVD, no carotid bruit, no masses Cardiac: RRR; soft murmur, no rubs, or gallops Respiratory:   clear to auscultation bilaterally, normal work of breathing GI: soft, nontender, nondistended, + BS MS: no deformity or atrophy; 1+ lower extremity edema; distal pulses are 2+ in all 4 extremities   Skin: warm and dry, no rash Neuro:  Strength and sensation are intact Psych: euthymic mood, full affect   EKG:  EKG is ordered today. The ekg ordered today demonstrates sinus rhythm, heart rate 73, first-degree  AV block with PR interval 210 ms, no acute ischemic changes, no Q waves  MYOVIEW: 07/14/2017  The left ventricular ejection fraction is normal (55-65%).  Nuclear stress EF: 59%.  There was no ST segment deviation noted during stress.  The study is normal.  This is a low risk study.  Normal pharmacologic nuclear stress test with no evidence for prior infarct or ischemia.   ECHO: 11/19/2016 - Left ventricle: The cavity size was normal. Systolic function was   normal. The estimated ejection fraction was in the range of 50%   to 55%. Wall motion was normal; there were no regional wall   motion abnormalities. Doppler parameters are consistent with   abnormal left ventricular relaxation (grade 1 diastolic   dysfunction). Doppler parameters are consistent with   indeterminate ventricular filling pressure. - Aortic valve: Transvalvular velocity was within the normal range.   There was no stenosis. There was trivial regurgitation. - Mitral valve: Transvalvular velocity was within the normal range.   There was no evidence for stenosis. There was trivial   regurgitation. - Right ventricle: The cavity size was normal. Wall thickness was   normal. Systolic function was normal. - Atrial septum: No defect or patent foramen ovale was identified   by color flow Doppler. - Tricuspid valve: There was trivial regurgitation. - Pulmonary arteries: Systolic pressure was within the normal   range. PA peak pressure: 23 mm Hg (S).  Recent Labs: 09/25/2016: ALT 27 11/07/2016: BUN 16; Creatinine, Ser  0.94; Potassium 5.5; Sodium 143 07/08/2017: Hemoglobin 14.0; Platelets 248    Lipid Panel    Component Value Date/Time   CHOL 232 (H) 05/17/2014 0834   TRIG 133.0 05/17/2014 0834   HDL 64.80 05/17/2014 0834   CHOLHDL 4 05/17/2014 0834   VLDL 26.6 05/17/2014 0834   LDLCALC 141 (H) 05/17/2014 0834   LDLDIRECT 106.8 01/10/2011 1218     Wt Readings from Last 3 Encounters:  09/11/17 187 lb (84.8 kg)  07/14/17 190 lb (86.2 kg)  07/08/17 190 lb 3.2 oz (86.3 kg)     Other studies Reviewed: Additional studies/ records that were reviewed today include: Office notes, hospital records and testing.  ASSESSMENT AND PLAN:  1.  Chronic diastolic CHF: She has some lower extremity edema, but her weight is actually decreased.  - Restart Lasix at 20 mg daily.  Continue Spironolactone 25 mg daily. -Track weights daily - follow-up with Dr. Claiborne Billings -If she has not had labs in the meantime, check a BMET then  2.  Chest pain, low risk for cardiac etiology -Her stress test was negative.  She has had no exertional symptoms. -She believes the pain may be GI related. -It was helped by the nitroglycerin that EMS gave her which was probably fresh but not by the nitroglycerin she had at home which was old. -I advised that there are certain types of GI pain that are helped by nitroglycerin.  I am happy to give her prescription for this -She is encouraged not to eat large meals and not to eat late in the evening.  3.  Hypertension: Her blood pressure is basically at goal on current medications, no change except to restart the Lasix  Current medicines are reviewed at length with the patient today.  The patient does not have concerns regarding medicines.  The following changes have been made: Restart Lasix, prescription for nitroglycerin  Labs/ tests ordered today include:  No orders of the defined types were placed in this encounter.  Disposition:   FU with Dr. Claiborne Billings  Signed, Rosaria Ferries, PA-C    09/11/2017 4:06 PM    [facility] Phone: [phone]; Fax: [phone]  This note was written with the assistance of speech recognition software. Please excuse any transcriptional errors.

## 2017-09-16 NOTE — Addendum Note (Signed)
Addended by: Vennie Homans on: 09/16/2017 10:38 AM   Modules accepted: Orders

## 2017-09-19 ENCOUNTER — Other Ambulatory Visit: Payer: Self-pay | Admitting: Allergy and Immunology

## 2017-10-19 ENCOUNTER — Other Ambulatory Visit: Payer: Self-pay

## 2017-10-19 NOTE — Patient Outreach (Signed)
[facility] Care Management  10/19/2017  [name] [birth year] [id number]

## 2017-11-04 ENCOUNTER — Ambulatory Visit: Payer: Medicare Other | Admitting: Cardiovascular Disease

## 2017-11-04 ENCOUNTER — Encounter: Payer: Self-pay | Admitting: Cardiovascular Disease

## 2017-11-04 VITALS — BP 134/67 | HR 64 | Ht <= 58 in | Wt 189.2 lb

## 2017-11-04 DIAGNOSIS — R600 Localized edema: Secondary | ICD-10-CM

## 2017-11-04 DIAGNOSIS — J4520 Mild intermittent asthma, uncomplicated: Secondary | ICD-10-CM

## 2017-11-04 DIAGNOSIS — Z79899 Other long term (current) drug therapy: Secondary | ICD-10-CM | POA: Diagnosis not present

## 2017-11-04 DIAGNOSIS — M25473 Effusion, unspecified ankle: Secondary | ICD-10-CM | POA: Diagnosis not present

## 2017-11-04 DIAGNOSIS — I10 Essential (primary) hypertension: Secondary | ICD-10-CM

## 2017-11-04 DIAGNOSIS — E785 Hyperlipidemia, unspecified: Secondary | ICD-10-CM

## 2017-11-04 DIAGNOSIS — I5032 Chronic diastolic (congestive) heart failure: Secondary | ICD-10-CM | POA: Diagnosis not present

## 2017-11-04 NOTE — Progress Notes (Signed)
Patient ID: Mary Roth, female   DOB: 17-Aug-1928, 84 y.o.   MRN: [id number]    HPI:  [name] is an 84 year old female who presents for a 39 month cardiology followup evaluation.  I last saw her in October 2016.  [name] has a long history of asthma and previously was under the care  Dr. Ignacia Palma. She takes  Advair PRN as well as albuterol  and also takes Flonase for rhinitis. She is a former patient of Dr. Melvern Banker. An echo Doppler study in 2012 showed concentric left ventricular hypertrophy with normal systolic function and grade 1 diastolic dysfunction. She had mild mitral annular calcification and evidence for aortic valve sclerosis Over the past several months, she had noticed increasing episodes of shortness of breath and also intermittent episodes of chest discomfort. She has noticed recent episodes of shortness of breath with walking. Approximately one month ago [place] experienced chest pressure associated with significant shortness of breath. She has noticed left arm radiation and also took a nitroglycerin which did seem to improve symptoms but this took approximately 20 minutes.   Additional problems include hyperlipidemia, hypothyroidism, and intermittent leg swelling. She was told of having rheumatic fever while in 6 grade.  She underwent an echo Doppler study on 06/20/2013 which showed normal systolic function with an ejection fraction of 55-60% but with grade 1 diastolic dysfunction. She did not have significant valvular pathology and her chamber dimensions were normal. However, she did have left ventricular hypertrophy. A nuclear perfusion study was done on 06/23/2013 and this was felt to be normal without evidence for scar or ischemia. Post stress ejection fraction was 70%.  [name] continued to experience significant shortness of breath with activity and at times she feels this has progressed to the point where she is unable to  breathe. Her echo Doppler study showed normal right ventricular pressure at 11 mm and her peak RV to RA gradient was 8 mm. She denies any calf tenderness, there is no PND or orthopnea or palpitations.    I empirically added low-dose amlodipine 2.5 mg as well as Bystolic 2.5 mg to her medical regimen. Due to her continued shortness of breath with activity I recommended a cardiopulmonary met test to evaluate for potential microvascular angina and endothelial dysfunction. This was done 04/03/2014. She had normal functional status and achieved a peak maximum oxygen consumption 92% of predicted. Her cardiovascular response to exercise was felt to be indeterminate due to suboptimal peak cardiovascular stress load. She had a blunted chronotropic response to exercise. Pulmonary response was normal. Exercise was limited by effort. PFT summary revealed spirometry, lung volumes, and diffusion were within normal limits. Her anaerobic threshold was low at 9.4.  When I last saw her in 2016 she was remaining stable.  However she was under increased stress since her husband was undergoing chemotherapy for lung cancer.  Since I last saw her almost 3 years ago, she has seen numerous extenders in our office and last saw Rosaria Ferries on Oct 08, 2017.  In March 2019 she had undergone a nuclear perfusion study for some vague chest pain which was normal.  Retrospect she believes the pain may have been GI in etiology.  She is felt to have chronic diastolic heart failure and had some issues with lower extremity edema and Lasix was reinstituted.  Presently she followed by Dr. Valora Piccolo.  She is living in [facility].  She denies  any chest pain with activity.  She does have some issues with myalgia.  She has tested positive for alpha gel and does not eat meat.  She has issues with possible GI reflux.  She has not had recent laboratory.  She presents for reevaluation.  Past Medical History:  Diagnosis Date  .  Abnormal liver function test   . Alpha galactosidase deficiency   . Arthritis   . Asthma   . Chest pain    a. 06/2013 [facility] MV: no ischemia/infarct;  b. 12/2015 MV ([place]): EF 82%, no ischemia.  . DDD (degenerative disc disease), lumbar   . DDD (degenerative disc disease), lumbosacral   . Esophageal reflux   . Hepatic steatosis   . Mastodynia   . Murmur   . Osteopenia 03/2012   T score -1.4 FRAX 20%/11%; Dr Phineas Real  . Other and unspecified hyperlipidemia   . Personal history of colonic polyps 08/21/2006   ADENOMATOUS POLYP  . Progressive Dyspnea    a. 06/2013 Echo: EF 55-60%, Gr1 DD;  b. 03/2014 CPX testing: indeterminate cv response due to suboptimal cv stress load and blunted chronotropic response. Nl pulm response, nl PFTs;  c. 12/2015 Echo [facility]): EF 55-60%, no significant valve dzs.  [name] Spinal stenosis   . Unspecified asthma(493.90) 2003    no childhood asthma  . Unspecified essential hypertension   . Unspecified hypothyroidism     Past Surgical History:  Procedure Laterality Date  . APPENDECTOMY    . CHOLECYSTECTOMY  1973    for stones  . TONSILLECTOMY    . UTERINE SUSPENSION  1968   [place] , [place]    Allergies  Allergen Reactions  . Beef Extract Anaphylaxis    History of Alpha Gal : Hoff meat allergy.  . Lambs Quarters Anaphylaxis  . Other Anaphylaxis  . Poractant Alfa Anaphylaxis  . Pork-Derived Products Anaphylaxis  . Phenergan [Promethazine Hcl] Nausea And Vomiting    Current Outpatient Medications  Medication Sig Dispense Refill  . albuterol (PROAIR HFA) 108 (90 Base) MCG/ACT inhaler Inhale into the lungs every 6 (six) hours as needed for wheezing or shortness of breath.    [name] albuterol (PROVENTIL) (2.5 MG/3ML) 0.083% nebulizer solution Take 3 mLs (2.5 mg total) by nebulization every 4 (four) hours as needed for wheezing. 75 mL 1  . amLODipine (NORVASC) 2.5 MG tablet TAKE 1 TABLET BY MOUTH ONCE A DAY 30 tablet 10  . BYSTOLIC 5 MG tablet TAKE 1 TABLET  (5 MG TOTAL) BY MOUTH DAILY. 30 tablet 10  . cetirizine (ZYRTEC ALLERGY) 10 MG tablet Take 10 mg by mouth daily.    . fexofenadine (ALLEGRA) 180 MG tablet Take 180 mg by mouth daily as needed (asthma).     . fluticasone (FLONASE) 50 MCG/ACT nasal spray Place into the nose.    . furosemide (LASIX) 20 MG tablet Take 1 tablet (20 mg total) by mouth daily. 30 tablet 3  . levothyroxine (SYNTHROID, LEVOTHROID) 88 MCG tablet Take by mouth.    . montelukast (SINGULAIR) 10 MG tablet take 1 tablet by mouth at bedtime *patient needs an office visit and will not get anymore refills until then* 30 tablet 3  . nitroGLYCERIN (NITROSTAT) 0.4 MG SL tablet Place 1 tablet (0.4 mg total) under the tongue every 15 (fifteen) minutes as needed for chest pain. 90 tablet 3  . rOPINIRole (REQUIP) 0.25 MG tablet Take 1 tablet (0.25 mg total) by mouth at bedtime. 1 pill 2 hrs pre bedtime 30 tablet  2  . rosuvastatin (CRESTOR) 5 MG tablet TAKE 1 TABLET (5 MG TOTAL) BY MOUTH AT BEDTIME. 30 tablet 10  . spironolactone (ALDACTONE) 25 MG tablet TAKE ONE TABLET BY MOUTH ONE TIME DAILY  60 tablet 6   No current facility-administered medications for this visit.     Socially she is married has 2 children one grandchild. There is no tobacco history. She does drink alcohol occasionally.  Family History  Problem Relation Age of Onset  . Uterine cancer Mother   . Ovarian cancer Mother   . Eczema Mother   . Heart attack Father 28  . Prostate cancer Father   . Breast cancer Sister        x 3 sisters  . Asthma Sister   . Food Allergy Sister   . Alcohol abuse Brother        x 3  . Prostate cancer Brother        x 3 brothers  . Pancreatic cancer Brother        x 2  . Diabetes Neg Hx   . Stroke Neg Hx   . Allergic rhinitis Neg Hx   . Urticaria Neg Hx    ROS General: Negative; No fevers, chills, or night sweats;  HEENT: Negative; No changes in vision or hearing, sinus congestion, difficulty swallowing Pulmonary: Positive  for asthma Cardiovascular: Negative; No chest pain, presyncope, syncope, palpitations GI: Positive for GERD GU: Negative; No dysuria, hematuria, or difficulty voiding Musculoskeletal: Negative; no myalgias, joint pain, or weakness Hematologic/Oncology: Negative; no easy bruising, bleeding Endocrine: Positive for hypothyroidism Neuro: Positive for peripheral neuropathy Skin: Negative; No rashes or skin lesions Psychiatric: Negative; No behavioral problems, depression Sleep: Negative; No snoring, daytime sleepiness, hypersomnolence, bruxism, restless legs, hypnogognic hallucinations, no cataplexy   PE BP 134/67   Pulse 64   Ht 4' 10"  (1.473 m)   Wt 189 lb 3.2 oz (85.8 kg)   BMI 39.54 kg/m    Wt Readings from Last 3 Encounters:  11/04/17 189 lb 3.2 oz (85.8 kg)  09/11/17 187 lb (84.8 kg)  07/14/17 190 lb (86.2 kg)    General: Alert, oriented, no distress.  Skin: normal turgor, no rashes, warm and dry HEENT: Normocephalic, atraumatic. Pupils equal round and reactive to light; sclera anicteric; extraocular muscles intact;  Nose without nasal septal hypertrophy Mouth/Parynx benign; Mallinpatti scale 3 Neck: No JVD, no carotid bruits; normal carotid upstroke Lungs: clear to ausculatation and percussion; no wheezing or rales Chest wall: without tenderness to palpitation Heart: PMI not displaced, RRR, s1 s2 normal, 1/6 systolic murmur, no diastolic murmur, no rubs, gallops, thrills, or heaves Abdomen: soft, nontender; no hepatosplenomehaly, BS+; abdominal aorta nontender and not dilated by palpation. Back: no CVA tenderness Pulses 2+ Musculoskeletal: full range of motion, normal strength, no joint deformities Extremities: Trivial ankle edema; no clubbing cyanosis, Homan's sign negative  Neurologic: grossly nonfocal; Cranial nerves grossly wnl Psychologic: Normal mood and affect   ECG (independently read by me): Sinus rhythm with first-degree AV block.  Heart rate 64 bpm.  PR  interval 212 ms.  October 2016 ECG (independently read by me): Normal  inus rhythm at 80 bpm with one isolated PVC.  QTc interval 435 ms.  PR duration 192 ms  March 2015 Last ECG (independently read by me): Normal sinus rhythm at 64 beats per minute. Normal intervals. No evidence for either an S wave in I or Q in 3.  Prior ECG of 06/08/2013 (independently read by me): Sinus rhythm at  78 beats per minute. No significant ST changes. Normal intervals.  LABS:. BMP Latest Ref Rng & Units 11/05/2017 11/07/2016 09/25/2016  Glucose 65 - 99 mg/dL 91 85 145(H)  BUN 8 - 27 mg/dL 18 16 24(H)  Creatinine 0.57 - 1.00 mg/dL 0.76 0.94 1.03(H)  BUN/Creat Ratio 12 - 28 24 17  -  Sodium 134 - 144 mmol/L 144 143 139  Potassium 3.5 - 5.2 mmol/L 5.2 5.5(H) 4.8  Chloride 96 - 106 mmol/L 105 101 102  CO2 20 - 29 mmol/L 24 25 26   Calcium 8.7 - 10.3 mg/dL 9.6 10.0 9.2   Hepatic Function Latest Ref Rng & Units 11/05/2017 09/25/2016 12/07/2015  Total Protein 6.0 - 8.5 g/dL 6.3 6.7 6.1  Albumin 3.5 - 4.7 g/dL 4.0 3.5 3.8  AST 0 - 40 IU/L 25 42(H) 20  ALT 0 - 32 IU/L 14 27 23   Alk Phosphatase 39 - 117 IU/L 81 90 87  Total Bilirubin 0.0 - 1.2 mg/dL 0.3 0.5 0.4  Bilirubin, Direct 0.0 - 0.3 mg/dL - - -   CBC Latest Ref Rng & Units 11/05/2017 07/08/2017 09/25/2016  WBC 3.4 - 10.8 x10E3/uL 7.0 8.9 14.8(H)  Hemoglobin 11.1 - 15.9 g/dL 13.5 14.0 13.2  Hematocrit 34.0 - 46.6 % 40.7 40.5 39.3  Platelets 150 - 450 x10E3/uL 246 248 196   Lab Results  Component Value Date   MCV 94 11/05/2017   MCV 92 07/08/2017   MCV 95.9 09/25/2016   Lab Results  Component Value Date   TSH 0.633 11/05/2017   Lab Results  Component Value Date   HGBA1C 6.1 04/27/2012   Lipid Panel     Component Value Date/Time   CHOL 196 11/05/2017 0857   TRIG 131 11/05/2017 0857   HDL 62 11/05/2017 0857   CHOLHDL 3.2 11/05/2017 0857   CHOLHDL 4 05/17/2014 0834   VLDL 26.6 05/17/2014 0834   LDLCALC 108 (H) 11/05/2017 0857   LDLDIRECT 106.8  01/10/2011 1218      RADIOLOGY: No results found.  IMPRESSION: 1. Essential hypertension   2. Bilateral leg edema   3. Chronic diastolic CHF (congestive heart failure), NYHA class 2 ([place])   4. Medication management   5. Hyperlipidemia, unspecified hyperlipidemia type   6. Ankle edema   7. Mild intermittent asthma without complication     ASSESSMENT AND PLAN: [name] is an 84 year old female who has a long-standing history of asthma and in the past developed increasing shortness of breath. She has noticed several episodes of significant chest pressure and on one instance associated with left arm radiation. When I initially saw her I  added low-dose amlodipine at 2.5 mg in light of this chest pressure to induced vasodilation and also added a very low dose cardioselective beta-blockade with bystolic 2.5 mg and suggested that she initiate a baby aspirin 81 mg.  Prior testing demonstrated a normal myocardial perfusion scan and echo Doppler study did show normal systolic function with grade 1 diastolic dysfunction. A cardiopulmonary met test showed fairly normal maximum oxygen consumption. However, the test was somewhat indeterminate due to to her inability to achieve a maximum heart rate. She was only to able to achieve a heart rate of 103 which was submaximal. Her heart rate response was somewhat blunted. I suspect much of her shortness of breath most likely contributed by reduced aerobic capacity.  Permanent function studies were normal.  Over the past 3 years, she has remained relatively stable and has seen numerous APP's in our office.  She underwent a subsequent nuclear perfusion study for chest pain which again revealed normal perfusion.  Since Dr. Linna Darner retired she now sees Dr. Valora Piccolo in [place], a [place].  He has noticed some intermittent leg swelling.  He has been on spironolactone 25 mg daily in addition to furosemide and continues to take low-dose amlodipine 2.5 mg and Bystolic 5 mg daily.  Her  blood pressure today is stable and on repeat by me was 120/62.  I have suggested support stockings which may be beneficial with her occasional edema.  She has not had recent fasting laboratory in a complete set of blood work will be obtained.  She has a history of hypothyroidism and continues to be on levothyroxine at 88 mcg.  She is on rosuvastatin 5 mg for hyperlipidemia.  Time spent: 25 minutes  Troy Sine, MD, [facility] 11/06/2017 7:55 PM

## 2017-11-04 NOTE — Patient Instructions (Signed)
Medication Instructions:  Your physician recommends that you continue on your current medications as directed. Please refer to the Current Medication list given to you today.  Labwork: Please return for FASTING labs (CMET, CBC, Lipid, TSH)  Our in office lab hours are Monday-Friday 8:00-4:00, closed for lunch 12:45-1:45 pm.  No appointment needed.  Follow-Up: 6 months with Dr. Claiborne Billings  Any Other Special Instructions Will Be Listed Below (If Applicable).   How to Use Compression Stockings Compression stockings are elastic socks that squeeze the legs. They help to increase blood flow to the legs, decrease swelling in the legs, and reduce the chance of developing blood clots in the lower legs. Compression stockings are often used by people who:  Are recovering from surgery.  Have poor circulation in their legs.  Are prone to getting blood clots in their legs.  Have varicose veins.  Sit or stay in bed for long periods of time.  How to use compression stockings Before you put on your compression stockings:  Make sure that they are the correct size. If you do not know your size, ask your health care provider.  Make sure that they are clean, dry, and in good condition.  Check them for rips and tears. Do not put them on if they are ripped or torn.  Put your stockings on first thing in the morning, before you get out of bed. Keep them on for as long as your health care provider advises. When you are wearing your stockings:  Keep them as smooth as possible. Do not allow them to bunch up. It is especially important to prevent the stockings from bunching up around your toes or behind your knees.  Do not roll the stockings downward and leave them rolled down. This can decrease blood flow to your leg.  Change them right away if they become wet or dirty.  When you take off your stockings, inspect your legs and feet. Anything that does not seem normal may require medical attention. Look  for:  Open sores.  Red spots.  Swelling.  Information and tips  Do not stop wearing your compression stockings without talking to your health care provider first.  Wash your stockings every day with mild detergent in cold or warm water. Do not use bleach. Air-dry your stockings or dry them in a clothes dryer on low heat.  Replace your stockings every 3-6 months.  If skin moisturizing is part of your treatment plan, apply lotion or cream at night so that your skin will be dry when you put on the stockings in the morning. It is harder to put the stockings on when you have lotion on your legs or feet. Contact a health care provider if: Remove your stockings and seek medical care if:  You have a feeling of pins and needles in your feet or legs.  You have any new changes in your skin.  You have skin lesions that are getting worse.  You have swelling or pain that is getting worse.  Get help right away if:  You have numbness or tingling in your lower legs that does not get better right after you take the stockings off.  Your toes or feet become cold and blue.  You develop open sores or red spots on your legs that do not go away.  You see or feel a warm spot on your leg.  You have new swelling or soreness in your leg.  You are short of breath or you have  chest pain for no reason.  You have a rapid or irregular heartbeat.  You feel light-headed or dizzy. This information is not intended to replace advice given to you by your health care provider. Make sure you discuss any questions you have with your health care provider. Document Released: 02/23/2009 Document Revised: 09/26/2015 Document Reviewed: 04/05/2014 Elsevier Interactive Patient Education  [employer].    If you need a refill on your cardiac medications before your next appointment, please call your pharmacy.

## 2017-11-05 DIAGNOSIS — I5032 Chronic diastolic (congestive) heart failure: Secondary | ICD-10-CM | POA: Diagnosis not present

## 2017-11-05 DIAGNOSIS — E785 Hyperlipidemia, unspecified: Secondary | ICD-10-CM | POA: Diagnosis not present

## 2017-11-05 DIAGNOSIS — Z79899 Other long term (current) drug therapy: Secondary | ICD-10-CM | POA: Diagnosis not present

## 2017-11-05 DIAGNOSIS — R600 Localized edema: Secondary | ICD-10-CM | POA: Diagnosis not present

## 2017-11-05 DIAGNOSIS — I10 Essential (primary) hypertension: Secondary | ICD-10-CM | POA: Diagnosis not present

## 2017-11-05 LAB — COMPREHENSIVE METABOLIC PANEL
A/G RATIO: 1.7 (ref 1.2–2.2)
ALT: 14 IU/L (ref 0–32)
AST: 25 IU/L (ref 0–40)
Albumin: 4 g/dL (ref 3.5–4.7)
Alkaline Phosphatase: 81 IU/L (ref 39–117)
BUN/Creatinine Ratio: 24 (ref 12–28)
BUN: 18 mg/dL (ref 8–27)
Bilirubin Total: 0.3 mg/dL (ref 0.0–1.2)
CALCIUM: 9.6 mg/dL (ref 8.7–10.3)
CO2: 24 mmol/L (ref 20–29)
CREATININE: 0.76 mg/dL (ref 0.57–1.00)
Chloride: 105 mmol/L (ref 96–106)
GFR, EST AFRICAN AMERICAN: 82 mL/min/{1.73_m2} (ref >59)
GFR, EST NON AFRICAN AMERICAN: 71 mL/min/{1.73_m2} (ref >59)
GLUCOSE: 91 mg/dL (ref 65–99)
Globulin, Total: 2.3 g/dL (ref 1.5–4.5)
Potassium: 5.2 mmol/L (ref 3.5–5.2)
Sodium: 144 mmol/L (ref 134–144)
TOTAL PROTEIN: 6.3 g/dL (ref 6.0–8.5)

## 2017-11-05 LAB — CBC
HEMATOCRIT: 40.7 % (ref 34.0–46.6)
HEMOGLOBIN: 13.5 g/dL (ref 11.1–15.9)
MCH: 31.2 pg (ref 26.6–33.0)
MCHC: 33.2 g/dL (ref 31.5–35.7)
MCV: 94 fL (ref 79–97)
Platelets: 246 10*3/uL (ref 150–450)
RBC: 4.33 x10E6/uL (ref 3.77–5.28)
RDW: 14.1 % (ref 12.3–15.4)
WBC: 7 10*3/uL (ref 3.4–10.8)

## 2017-11-05 LAB — LIPID PANEL
CHOL/HDL RATIO: 3.2 ratio (ref 0.0–4.4)
Cholesterol, Total: 196 mg/dL (ref 100–199)
HDL: 62 mg/dL (ref >39)
LDL CALC: 108 mg/dL — AB (ref 0–99)
Triglycerides: 131 mg/dL (ref 0–149)
VLDL Cholesterol Cal: 26 mg/dL (ref 5–40)

## 2017-11-05 LAB — TSH: TSH: 0.633 u[IU]/mL (ref 0.450–4.500)

## 2017-11-06 ENCOUNTER — Encounter: Payer: Self-pay | Admitting: Cardiovascular Disease

## 2017-11-10 ENCOUNTER — Encounter: Payer: Self-pay | Admitting: *Deleted

## 2017-11-13 ENCOUNTER — Encounter: Payer: Self-pay | Admitting: *Deleted

## 2017-12-22 ENCOUNTER — Encounter: Payer: Self-pay | Admitting: Women's Health

## 2017-12-22 ENCOUNTER — Telehealth: Payer: Self-pay | Admitting: *Deleted

## 2017-12-22 ENCOUNTER — Ambulatory Visit: Payer: Medicare Other | Admitting: Women's Health

## 2017-12-22 VITALS — BP 130/80

## 2017-12-22 DIAGNOSIS — N644 Mastodynia: Secondary | ICD-10-CM

## 2017-12-22 NOTE — Telephone Encounter (Signed)
Patient informed. 

## 2017-12-22 NOTE — Telephone Encounter (Signed)
Patient on 12/28/17 @ 8:40am at breast center, left message for patient to call.

## 2017-12-22 NOTE — Telephone Encounter (Signed)
-----   Message from Huel Cote, NP sent at 12/22/2017 10:27 AM EDT ----- Diagnostic mammogram rt breast, last mammo  03/2016 tenderness rt breast 1-3 for about a month.  anyday except THURSDAYs

## 2017-12-22 NOTE — Patient Instructions (Signed)

## 2017-12-22 NOTE — Progress Notes (Signed)
84 year old MWF G2, P2 presents with complaint of right upper inner breast discomfort for the past month that has increased and left lower intermittent abdominal pain for over a year.  Denies palpable change with breast exam, dimpling or nipple discharge.  Denies change in activity, injury, last mammogram 2017 normal.  No change in bowel elimination, denies relief or change in discomfort with eating, bowel movements, activity.  States increased left lower quadrant pain with lying flat.  Has had a ultrasound that showed atrophied right and left ovary.  Husband currently going through cancer treatment at [facility].  Lives at [place] in an apartment.  Medical problems include hypertension, asthma, cholesteremia, hypothyroidism, GERD and obesity.  Exam: Appears well, obese.  Breast examined sitting and lying position without visible dimpling, nipple discharge, erythema, no palpable nodules, diffuse tenderness in the right breast at the 1 to 3 o'clock position.  Abdomen soft nontender, obese, no rebound or radiation of pain, pain left lower lateral area.  Right breast mastodynia  Plan: Diagnostic right breast mammogram with ultrasound, vitamin E twice daily.  pelvic ultrasound offered declines.

## 2017-12-28 ENCOUNTER — Ambulatory Visit
Admission: RE | Admit: 2017-12-28 | Discharge: 2017-12-28 | Disposition: A | Discharge status: Home / Self Care | Payer: Medicare Other | Source: Ambulatory Visit | Attending: Women's Health | Admitting: Women's Health

## 2017-12-28 DIAGNOSIS — R928 Other abnormal and inconclusive findings on diagnostic imaging of breast: Secondary | ICD-10-CM | POA: Diagnosis not present

## 2017-12-28 DIAGNOSIS — N644 Mastodynia: Secondary | ICD-10-CM

## 2018-01-01 DIAGNOSIS — I959 Hypotension, unspecified: Secondary | ICD-10-CM | POA: Diagnosis not present

## 2018-01-01 DIAGNOSIS — R300 Dysuria: Secondary | ICD-10-CM | POA: Diagnosis not present

## 2018-01-15 ENCOUNTER — Other Ambulatory Visit: Payer: Self-pay | Admitting: Allergy and Immunology

## 2018-01-26 ENCOUNTER — Ambulatory Visit: Payer: Medicare Other | Admitting: Family Medicine

## 2018-01-26 ENCOUNTER — Encounter: Payer: Self-pay | Admitting: Family Medicine

## 2018-01-26 VITALS — BP 126/62 | HR 64 | Temp 98.0°F | Resp 16 | Ht <= 58 in | Wt 188.0 lb

## 2018-01-26 DIAGNOSIS — J4540 Moderate persistent asthma, uncomplicated: Secondary | ICD-10-CM | POA: Diagnosis not present

## 2018-01-26 DIAGNOSIS — T7800XD Anaphylactic reaction due to unspecified food, subsequent encounter: Secondary | ICD-10-CM | POA: Diagnosis not present

## 2018-01-26 DIAGNOSIS — J3089 Other allergic rhinitis: Secondary | ICD-10-CM

## 2018-01-26 DIAGNOSIS — J45901 Unspecified asthma with (acute) exacerbation: Secondary | ICD-10-CM

## 2018-01-26 MED ORDER — MONTELUKAST SODIUM 10 MG PO TABS: 10.0000 mg | ORAL_TABLET | Freq: Every day | ORAL | 5 refills | Status: DC

## 2018-01-26 MED ORDER — ALBUTEROL SULFATE (2.5 MG/3ML) 0.083% IN NEBU: INHALATION_SOLUTION | RESPIRATORY_TRACT | 1 refills | Status: DC

## 2018-01-26 MED ORDER — EPINEPHRINE 0.3 MG/0.3ML IJ SOAJ: INTRAMUSCULAR | 2 refills | Status: DC

## 2018-01-26 MED ORDER — ALBUTEROL SULFATE HFA 108 (90 BASE) MCG/ACT IN AERS: 2.0000 | INHALATION_SPRAY | RESPIRATORY_TRACT | 1 refills | Status: DC | PRN

## 2018-01-26 NOTE — Progress Notes (Signed)
[address] [place] [address] Dept: [phone]  FOLLOW UP NOTE  Patient ID: Mary Roth, female    DOB: 11-24-1928  Age: 84 y.o. MRN: [id number] Date of Office Visit: 01/26/2018  Assessment  Chief Complaint: Asthma (alpha gal) and Allergic Rhinitis   HPI Mary Roth is a 84 year old female who presents to the clinic for a follow up visit. Asthma is reported as well controlled.  Allergic rhintis is reported as well controlled. She continues to avoid mammalian products with one accidental exposure 6 months ago that resolved with one dose of cetrizine 10 mg.. She is now eating shellfish and peanuts without any problems.  She had an allergic reaction to chicken fried with pork lard 6 months ago.  She is allergic to alpha gal and avoids beef, pork, lamb,  Her current medications are listed in the chart.    Drug Allergies:  Allergies  Allergen Reactions  . Beef Extract Anaphylaxis    History of Alpha Gal : Hoff meat allergy.  . Goat's Rue  [Galega] Anaphylaxis  . Lambs Quarters Anaphylaxis  . Other Anaphylaxis  . Poractant Alfa Anaphylaxis  . Pork Allergy Anaphylaxis  . Pork-Derived Products Anaphylaxis  . Phenergan [Promethazine Hcl] Nausea And Vomiting    Physical Exam: BP 126/62 (BP Location: Right Arm, Patient Position: Sitting, Cuff Size: Normal)   Pulse 64   Temp 98 F (36.7 C) (Oral)   Resp 16   Ht 4\' 10"  (1.473 m)   Wt 188 lb (85.3 kg)   SpO2 94%   BMI 39.29 kg/m    Physical Exam  Constitutional: She is oriented to person, place, and time. She appears well-developed and well-nourished.  HENT:  Head: Normocephalic.  Right Ear: External ear normal.  Left Ear: External ear normal.  Bilateral nares slightly erythematous with no nasal drainage noted. Pharynx slightly erythematous with no exudate noted. Ears normal. Eyes normal.  Eyes: Conjunctivae are normal.  Neck: Normal range of motion. Neck supple.  Cardiovascular: Normal rate, regular rhythm  and normal heart sounds.  No murmur noted  Pulmonary/Chest: Effort normal and breath sounds normal.  Lungs clear to auscultation  Musculoskeletal: Normal range of motion.  Neurological: She is alert and oriented to person, place, and time.  Skin: Skin is warm and dry.  Psychiatric: She has a normal mood and affect. Her behavior is normal. Judgment and thought content normal.  Vitals reviewed.   Diagnostics: FVC 1.78, FEV1 1.38. Predicted FVC 1.58, predicted FEV1 1.14. Spirometry is within the normal range.  Assessment and Plan: 1. Moderate persistent asthma without complication   2. Perennial and seasonal allergic rhinitis   3. Anaphylactic shock due to food, subsequent encounter   4. Asthma with acute exacerbation, unspecified asthma severity, unspecified whether persistent     Meds ordered this encounter  Medications  . albuterol (PROVENTIL) (2.5 MG/3ML) 0.083% nebulizer solution    Sig: One ampule in nebulizer every 4 hours if needed for cough or wheeze.    Dispense:  75 mL    Refill:  1  . albuterol (PROAIR HFA) 108 (90 Base) MCG/ACT inhaler    Sig: Inhale 2 puffs into the lungs every 4 (four) hours as needed for wheezing or shortness of breath.    Dispense:  18 g    Refill:  1  . montelukast (SINGULAIR) 10 MG tablet    Sig: Take 1 tablet (10 mg total) by mouth at bedtime.    Dispense:  30 tablet  Refill:  5  . EPINEPHrine 0.3 mg/0.3 mL IJ SOAJ injection    Sig: Use as directed for severe allergic reaction.    Dispense:  2 Device    Refill:  2    Dispense MYLAN    Patient Instructions  Continue Breo Ellipta 100- 1 puff once a day to prevent cough or wheeze Continue montelukast 10 mg once a dat to prevent cough or wheeze ProAir 2 puffs as needed for cough or wheeze. You may use ProAir 2 puffs 5-15 minutes before exercise to prevent cough or wheeze Continue Flonase 1-2 sprays in each nostril once a day as needed for a stuffy nose Continue to avoid beef, lamb, pork,  . In case of an allergic reaction, give Benadryl 50 mg every 4 hours, and if life-threatening symptoms occur, inject with EpiPen 0.3 mg. We can retest alpha gal allergy in 2 years Continue the medications as listed in your chart  Call us if this treatment plan is not working well for you  Follow up in 6 months or sooner if needed   Return in about 6 months (around 07/27/2018), or if symptoms worsen or fail to improve.   Thank you for the opportunity to care for this patient.  Please do not hesitate to contact me with questions.  Gareth Morgan, FNP [facility]  I have provided oversight concerning Gareth Morgan' evaluation and treatment of this patient's health issues addressed during today's encounter. I agree with the assessment and therapeutic plan as outlined in the note.   Thank you for the opportunity to care for this patient.  Please do not hesitate to contact me with questions.  Penne Lash, M.D.  [facility] [address] [place], [place] [address] [phone]

## 2018-01-26 NOTE — Patient Instructions (Addendum)
Continue Breo Ellipta 100- 1 puff once a day to prevent cough or wheeze Continue montelukast 10 mg once a dat to prevent cough or wheeze ProAir 2 puffs as needed for cough or wheeze. You may use ProAir 2 puffs 5-15 minutes before exercise to prevent cough or wheeze Continue Flonase 1-2 sprays in each nostril once a day as needed for a stuffy nose Continue to avoid beef, lamb, pork, . In case of an allergic reaction, give Benadryl 50 mg every 4 hours, and if life-threatening symptoms occur, inject with EpiPen 0.3 mg. We can retest alpha gal allergy in 2 years Continue the medications as listed in your chart  Call us if this treatment plan is not working well for you  Follow up in 6 months or sooner if needed

## 2018-02-17 DIAGNOSIS — L570 Actinic keratosis: Secondary | ICD-10-CM | POA: Diagnosis not present

## 2018-02-17 DIAGNOSIS — D485 Neoplasm of uncertain behavior of skin: Secondary | ICD-10-CM | POA: Diagnosis not present

## 2018-02-17 DIAGNOSIS — L814 Other melanin hyperpigmentation: Secondary | ICD-10-CM | POA: Diagnosis not present

## 2018-02-17 DIAGNOSIS — L821 Other seborrheic keratosis: Secondary | ICD-10-CM | POA: Diagnosis not present

## 2018-03-08 ENCOUNTER — Other Ambulatory Visit: Payer: Self-pay

## 2018-03-08 MED ORDER — ROSUVASTATIN CALCIUM 5 MG PO TABS: 5.0000 mg | ORAL_TABLET | Freq: Every day | ORAL | 3 refills | Status: DC

## 2018-04-01 DIAGNOSIS — Z23 Encounter for immunization: Secondary | ICD-10-CM | POA: Diagnosis not present

## 2018-04-28 ENCOUNTER — Other Ambulatory Visit: Payer: Self-pay | Admitting: Cardiovascular Disease

## 2018-04-28 ENCOUNTER — Ambulatory Visit: Payer: Medicare Other | Admitting: Cardiovascular Disease

## 2018-04-28 ENCOUNTER — Encounter: Payer: Self-pay | Admitting: Cardiovascular Disease

## 2018-04-28 VITALS — BP 124/64 | HR 71 | Ht <= 58 in | Wt 190.0 lb

## 2018-04-28 DIAGNOSIS — I5032 Chronic diastolic (congestive) heart failure: Secondary | ICD-10-CM

## 2018-04-28 DIAGNOSIS — J4520 Mild intermittent asthma, uncomplicated: Secondary | ICD-10-CM

## 2018-04-28 DIAGNOSIS — E7800 Pure hypercholesterolemia, unspecified: Secondary | ICD-10-CM

## 2018-04-28 DIAGNOSIS — I10 Essential (primary) hypertension: Secondary | ICD-10-CM | POA: Diagnosis not present

## 2018-04-28 DIAGNOSIS — M25473 Effusion, unspecified ankle: Secondary | ICD-10-CM

## 2018-04-28 MED ORDER — FUROSEMIDE 20 MG PO TABS: 20.0000 mg | ORAL_TABLET | ORAL | 3 refills | Status: DC | PRN

## 2018-04-28 NOTE — Progress Notes (Signed)
Patient ID: Mary Roth, female   DOB: Feb 05, 1929, 84 y.o.   MRN: [id number]    HPI:  [name] is an 84 year old female who presents for a 6 month cardiology followup evaluation.   [name] has a long history of asthma and previously was under the care  Dr. Ignacia Palma. She takes  Advair PRN as well as albuterol  and also takes Flonase for rhinitis. She is a former patient of Dr. Melvern Banker. An echo Doppler study in 2012 showed concentric left ventricular hypertrophy with normal systolic function and grade 1 diastolic dysfunction. She had mild mitral annular calcification and evidence for aortic valve sclerosis Over the past several months, she had noticed increasing episodes of shortness of breath and also intermittent episodes of chest discomfort. She has noticed recent episodes of shortness of breath with walking. Approximately one month ago [place] experienced chest pressure associated with significant shortness of breath. She has noticed left arm radiation and also took a nitroglycerin which did seem to improve symptoms but this took approximately 20 minutes.   Additional problems include hyperlipidemia, hypothyroidism, and intermittent leg swelling. She was told of having rheumatic fever while in 6 grade.  She underwent an echo Doppler study on 06/20/2013 which showed normal systolic function with an ejection fraction of 55-60% but with grade 1 diastolic dysfunction. She did not have significant valvular pathology and her chamber dimensions were normal. However, she did have left ventricular hypertrophy. A nuclear perfusion study was done on 06/23/2013 and this was felt to be normal without evidence for scar or ischemia. Post stress ejection fraction was 70%.  [name] continued to experience significant shortness of breath with activity and at times she feels this has progressed to the point where she is unable to breathe. Her echo Doppler study showed  normal right ventricular pressure at 11 mm and her peak RV to RA gradient was 8 mm. She denies any calf tenderness, there is no PND or orthopnea or palpitations.    I empirically added low-dose amlodipine 2.5 mg as well as Bystolic 2.5 mg to her medical regimen. Due to her continued shortness of breath with activity I recommended a cardiopulmonary met test to evaluate for potential microvascular angina and endothelial dysfunction. This was done 04/03/2014. She had normal functional status and achieved a peak maximum oxygen consumption 92% of predicted. Her cardiovascular response to exercise was felt to be indeterminate due to suboptimal peak cardiovascular stress load. She had a blunted chronotropic response to exercise. Pulmonary response was normal. Exercise was limited by effort. PFT summary revealed spirometry, lung volumes, and diffusion were within normal limits. Her anaerobic threshold was low at 9.4.  When I last saw her in 2016 she was remaining stable.  However she was under increased stress since her husband was undergoing chemotherapy for lung cancer.  She has seen numerous extenders in our office and last saw Mary Roth on Oct 08, 2017.  In March 2019 she had undergone a nuclear perfusion study for some vague chest pain which was normal.  Retrospect she believes the pain may have been GI in etiology.  She is felt to have chronic diastolic heart failure and had some issues with lower extremity edema and Lasix was reinstituted.  I last saw her in June 2019 not having seen her in almost 3 years.  She was living in the  [place] [facility].  Dr. Linna Darner had retired.  She had noticed  some intermittent leg swelling and was taking spironolactone in addition to furosemide, low-dose  amlodipine and Bystolic daily.    Over the past 6 months, she denies any episodes of chest pain or shortness of breath.  She has rarely been taking Lasix but has continued to take Spironolactone 25 mg  daily, Bystolic 5 mg and amlodipine 2.5 mg daily.  She has continued to take rosuvastatin 5 mg for hyperlipidemia.  She is on levothyroxine 88 mcg for hypothyroidism.  And she has Singulair and as needed albuterol.  She denies any chest pain.  D orthopnea.  The person shoes she had seen in place of Dr. Linna Darner has left and she will now be establishing with a new primary provider.  She presents for evaluation..  Past Medical History:  Diagnosis Date  . Abnormal liver function test   . Alpha galactosidase deficiency   . Arthritis   . Asthma   . Chest pain    a. 06/2013 [facility] MV: no ischemia/infarct;  b. 12/2015 MV ([place]): EF 82%, no ischemia.  . DDD (degenerative disc disease), lumbar   . DDD (degenerative disc disease), lumbosacral   . Esophageal reflux   . Hepatic steatosis   . Mastodynia   . Murmur   . Osteopenia 03/2012   T score -1.4 FRAX 20%/11%; Dr Phineas Real  . Other and unspecified hyperlipidemia   . Personal history of colonic polyps 08/21/2006   ADENOMATOUS POLYP  . Progressive Dyspnea    a. 06/2013 Echo: EF 55-60%, Gr1 DD;  b. 03/2014 CPX testing: indeterminate cv response due to suboptimal cv stress load and blunted chronotropic response. Nl pulm response, nl PFTs;  c. 12/2015 Echo [facility]): EF 55-60%, no significant valve dzs.  [name] Spinal stenosis   . Unspecified asthma(493.90) 2003    no childhood asthma  . Unspecified essential hypertension   . Unspecified hypothyroidism     Past Surgical History:  Procedure Laterality Date  . APPENDECTOMY    . CHOLECYSTECTOMY  1973    for stones  . TONSILLECTOMY    . UTERINE SUSPENSION  1968   [place] , [place]    Allergies  Allergen Reactions  . Beef Extract Anaphylaxis    History of Alpha Gal : Hoff meat allergy.  . Goat's Rue  [Galega] Anaphylaxis  . Lambs Quarters Anaphylaxis  . Other Anaphylaxis  . Poractant Alfa Anaphylaxis  . Pork Allergy Anaphylaxis  . Pork-Derived Products Anaphylaxis  . Phenergan [Promethazine  Hcl] Nausea And Vomiting    Current Outpatient Medications  Medication Sig Dispense Refill  . albuterol (PROAIR HFA) 108 (90 Base) MCG/ACT inhaler Inhale 2 puffs into the lungs every 4 (four) hours as needed for wheezing or shortness of breath. 18 g 1  . albuterol (PROVENTIL) (2.5 MG/3ML) 0.083% nebulizer solution One ampule in nebulizer every 4 hours if needed for cough or wheeze. 75 mL 1  . amLODipine (NORVASC) 2.5 MG tablet TAKE 1 TABLET BY MOUTH ONCE A DAY 30 tablet 10  . cetirizine (ZYRTEC ALLERGY) 10 MG tablet Take 10 mg by mouth daily.    [name] EPINEPHrine 0.3 mg/0.3 mL IJ SOAJ injection Use as directed for severe allergic reaction. 2 Device 2  . esomeprazole (NEXIUM) 40 MG capsule Take by mouth.    . fexofenadine (ALLEGRA) 180 MG tablet Take 180 mg by mouth daily as needed (asthma).     . fluticasone (FLONASE) 50 MCG/ACT nasal spray Place into the nose.    [name] Fluticasone-Salmeterol (ADVAIR) 250-50 MCG/DOSE AEPB Inhale  1 puff into the lungs daily as needed (if asthma flare).     . furosemide (LASIX) 20 MG tablet Take 1 tablet (20 mg total) by mouth as needed for edema. 30 tablet 3  . levothyroxine (SYNTHROID, LEVOTHROID) 88 MCG tablet Take by mouth.    . montelukast (SINGULAIR) 10 MG tablet Take 1 tablet (10 mg total) by mouth at bedtime. 30 tablet 5  . nitroGLYCERIN (NITROSTAT) 0.4 MG SL tablet Place 1 tablet (0.4 mg total) under the tongue every 15 (fifteen) minutes as needed for chest pain. 90 tablet 3  . rOPINIRole (REQUIP) 0.25 MG tablet Take 1 tablet (0.25 mg total) by mouth at bedtime. 1 pill 2 hrs pre bedtime 30 tablet 2  . rosuvastatin (CRESTOR) 5 MG tablet Take 1 tablet (5 mg total) by mouth at bedtime. 90 tablet 3  . spironolactone (ALDACTONE) 25 MG tablet TAKE ONE TABLET BY MOUTH ONE TIME DAILY  60 tablet 6  . BYSTOLIC 5 MG tablet take 1 tablet by mouth daily 30 tablet 9   No current facility-administered medications for this visit.     Socially she is married has 2 children  one grandchild. There is no tobacco history. She does drink alcohol occasionally.  Family History  Problem Relation Age of Onset  . Uterine cancer Mother   . Ovarian cancer Mother   . Eczema Mother   . Heart attack Father 73  . Prostate cancer Father   . Breast cancer Sister        x 3 sisters  . Asthma Sister   . Food Allergy Sister   . Alcohol abuse Brother        x 3  . Prostate cancer Brother        x 3 brothers  . Pancreatic cancer Brother        x 2  . Diabetes Neg Hx   . Stroke Neg Hx   . Allergic rhinitis Neg Hx   . Urticaria Neg Hx    ROS General: Negative; No fevers, chills, or night sweats;  HEENT: Negative; No changes in vision or hearing, sinus congestion, difficulty swallowing Pulmonary: Positive for asthma Cardiovascular: Negative; No chest pain, presyncope, syncope, palpitations GI: Positive for GERD GU: Negative; No dysuria, hematuria, or difficulty voiding Musculoskeletal: Negative; no myalgias, joint pain, or weakness Hematologic/Oncology: Negative; no easy bruising, bleeding Endocrine: Positive for hypothyroidism Neuro: Positive for peripheral neuropathy Skin: Negative; No rashes or skin lesions Psychiatric: Negative; No behavioral problems, depression Sleep: Negative; No snoring, daytime sleepiness, hypersomnolence, bruxism, restless legs, hypnogognic hallucinations, no cataplexy   PE BP 124/64   Pulse 71   Ht 4' 10"  (1.473 m)   Wt 190 lb (86.2 kg)   BMI 39.71 kg/m    Repeat blood pressure by me was 128/68  Wt Readings from Last 3 Encounters:  04/28/18 190 lb (86.2 kg)  01/26/18 188 lb (85.3 kg)  11/04/17 189 lb 3.2 oz (85.8 kg)   General: Alert, oriented, no distress.  Skin: normal turgor, no rashes, warm and dry HEENT: Normocephalic, atraumatic. Pupils equal round and reactive to light; sclera anicteric; extraocular muscles intact;  Nose without nasal septal hypertrophy Mouth/Parynx benign; Mallinpatti scale 3 Neck: No JVD, no carotid  bruits; normal carotid upstroke Lungs: clear to ausculatation and percussion; no wheezing or rales Chest wall: without tenderness to palpitation Heart: PMI not displaced, RRR, s1 s2 normal, 1/6 systolic murmur, no diastolic murmur, no rubs, gallops, thrills, or heaves Abdomen: soft, nontender; no hepatosplenomehaly, BS+;  abdominal aorta nontender and not dilated by palpation. Back: no CVA tenderness Pulses 2+ Musculoskeletal: full range of motion, normal strength, no joint deformities Extremities: Bilateral ankle edema;no clubbing cyanosis, Homan's sign negative  Neurologic: grossly nonfocal; Cranial nerves grossly wnl Psychologic: Normal mood and affect   ECG (independently read by me): Normal sinus rhythm at 71 bpm.  Borderline first-degree block with appeared over 204 ms.  Nonspecific ST changes.  Ectopy.  June 2019 ECG (independently read by me): Sinus rhythm with first-degree AV block.  Heart rate 64 bpm.  PR interval 212 ms.  October 2016 ECG (independently read by me): Normal  inus rhythm at 80 bpm with one isolated PVC.  QTc interval 435 ms.  PR duration 192 ms  March 2015 Last ECG (independently read by me): Normal sinus rhythm at 64 beats per minute. Normal intervals. No evidence for either an S wave in I or Q in 3.  Prior ECG of 06/08/2013 (independently read by me): Sinus rhythm at 78 beats per minute. No significant ST changes. Normal intervals.  LABS:. BMP Latest Ref Rng & Units 11/05/2017 11/07/2016 09/25/2016  Glucose 65 - 99 mg/dL 91 85 145(H)  BUN 8 - 27 mg/dL 18 16 24(H)  Creatinine 0.57 - 1.00 mg/dL 0.76 0.94 1.03(H)  BUN/Creat Ratio 12 - 28 24 17  -  Sodium 134 - 144 mmol/L 144 143 139  Potassium 3.5 - 5.2 mmol/L 5.2 5.5(H) 4.8  Chloride 96 - 106 mmol/L 105 101 102  CO2 20 - 29 mmol/L 24 25 26   Calcium 8.7 - 10.3 mg/dL 9.6 10.0 9.2   Hepatic Function Latest Ref Rng & Units 11/05/2017 09/25/2016 12/07/2015  Total Protein 6.0 - 8.5 g/dL 6.3 6.7 6.1  Albumin 3.5 - 4.7  g/dL 4.0 3.5 3.8  AST 0 - 40 IU/L 25 42(H) 20  ALT 0 - 32 IU/L 14 27 23   Alk Phosphatase 39 - 117 IU/L 81 90 87  Total Bilirubin 0.0 - 1.2 mg/dL 0.3 0.5 0.4  Bilirubin, Direct 0.0 - 0.3 mg/dL - - -   CBC Latest Ref Rng & Units 11/05/2017 07/08/2017 09/25/2016  WBC 3.4 - 10.8 x10E3/uL 7.0 8.9 14.8(H)  Hemoglobin 11.1 - 15.9 g/dL 13.5 14.0 13.2  Hematocrit 34.0 - 46.6 % 40.7 40.5 39.3  Platelets 150 - 450 x10E3/uL 246 248 196   Lab Results  Component Value Date   MCV 94 11/05/2017   MCV 92 07/08/2017   MCV 95.9 09/25/2016   Lab Results  Component Value Date   TSH 0.633 11/05/2017   Lab Results  Component Value Date   HGBA1C 6.1 04/27/2012   Lipid Panel     Component Value Date/Time   CHOL 196 11/05/2017 0857   TRIG 131 11/05/2017 0857   HDL 62 11/05/2017 0857   CHOLHDL 3.2 11/05/2017 0857   CHOLHDL 4 05/17/2014 0834   VLDL 26.6 05/17/2014 0834   LDLCALC 108 (H) 11/05/2017 0857   LDLDIRECT 106.8 01/10/2011 1218      RADIOLOGY: No results found.  IMPRESSION: 1. Essential hypertension   2. Ankle edema   3. Chronic diastolic CHF (congestive heart failure), NYHA class 2 ([place])   4. Mild intermittent asthma without complication   5. Hypercholesterolemia     ASSESSMENT AND PLAN: [name] is an 84 year old female who has a long-standing history of asthma and in the past developed increasing shortness of breath. She has noticed several episodes of significant chest pressure and on one instance associated with left arm radiation.  When I initially saw her I added low-dose amlodipine at 2.5 mg in light of this chest pressure to induced vasodilation and also added a very low dose cardioselective beta-blockade with bystolic 2.5 mg and suggested that she initiate a baby aspirin 81 mg.  Prior testing demonstrated a normal myocardial perfusion scan and echo Doppler study did show normal systolic function with grade 1 diastolic dysfunction. A cardiopulmonary met test showed fairly  normal maximum oxygen consumption. However, the test was somewhat indeterminate due to to her inability to achieve a maximum heart rate. She was only to able to achieve a heart rate of 103 which was submaximal. Her heart rate response was somewhat blunted. I suspected much of her shortness of breath most likely contributed by reduced aerobic capacity.  Pulmonary function studies were normal.  Presently, her blood pressure is stable on her current regimen consisting of amlodipine 2.5 mg daily and Bystolic 5 mg as well as spironolactone 25 mg.  She has had issues with ankle edema and has not been taking furosemide.  I have suggested that she try taking furosemide 20 mg on days where there is some mild edema.  I also strongly encouraged support stockings.  She is not having any chest pain symptomatology.  She denies any PND orthopnea.  She continues to be on rosuvastatin for hyperlipidemia.  LDL cholesterol in June was 108.  She will be establishing with a new primary MD he will be rechecking laboratory.  If cholesterol remains greater than 100 I would suggest further titration of rosuvastatin to at least 10 mg.  Her asthma is well controlled presently and there is no wheezing on exam.  As long as he remains stable I will see her in 1 year for reevaluation..  Time spent: 25 minutes  Troy Sine, MD, [facility] 04/30/2018 3:44 PM

## 2018-04-28 NOTE — Patient Instructions (Addendum)
Medication Instructions:  Take furosemide (Lasix) 20 mg AS NEEDED for swelling  If you need a refill on your cardiac medications before your next appointment, please call your pharmacy.   Follow-Up: At [facility], you and your health needs are our priority.  As part of our continuing mission to provide you with exceptional heart care, we have created designated Provider Care Teams.  These Care Teams include your primary Cardiologist (physician) and Advanced Practice Providers (APPs -  Physician Assistants and Nurse Practitioners) who all work together to provide you with the care you need, when you need it. You will need a follow up appointment in 12 months.  Please call our office 2 months in advance to schedule this appointment.  You may see Shelva Majestic, MD or one of the following Advanced Practice Providers on your designated Care Team: [place], [place] . Fabian Sharp, PA-C    How to Use Compression Stockings Compression stockings are elastic socks that squeeze the legs. They help increase blood flow (circulation) to the legs, decrease swelling in the legs, and reduce the chance of developing blood clots in the lower legs. Compression stockings are often used by people who:  Are recovering from surgery.  Have poor circulation in their legs.  Tend to get blood clots in their legs.  Have bulging (varicose) veins.  Sit or stay in bed for long periods of time. Follow instructions from your health care provider about how and when to wear your compression stockings. How to wear compression stockings Before you put on your compression stockings:  Make sure that they are the correct size and degree of compression. If you do not know your size or required grade of compression, ask your health care provider and follow the manufacturer's instructions that come with the stockings.  Make sure that they are clean, dry, and in good condition.  Check them for rips and tears. Do not put them on  if they are ripped or torn. Put your stockings on first thing in the morning, before you get out of bed. Keep them on for as long as your health care provider advises. When you are wearing your stockings:  Keep them as smooth as possible. Do not allow them to bunch up. It is especially important to prevent the stockings from bunching up around your toes or behind your knees.  Do not roll the stockings downward and leave them rolled down. This can decrease blood flow to your leg.  Change them right away if they become wet or dirty. When you take off your stockings, inspect your legs and feet. Check for:  Open sores.  Red spots.  Swelling. General tips  Do not stop wearing compression stockings without talking to your health care provider first.  Wash your stockings every day with mild detergent in cold or warm water. Do not use bleach. Air-dry your stockings or dry them in a clothes dryer on low heat. It may be helpful to have two pairs so that you have a pair to wear while the other is being washed.  Replace your stockings every 3-6 months.  If skin moisturizing is part of your treatment plan, apply lotion or cream at night so that your skin will be dry when you put on the stockings in the morning. It is harder to put the stockings on when you have lotion on your legs or feet.  Wear nonskid shoes or slip-resistant socks when walking while wearing compression stockings. Contact a health care provider  and remove your stockings if you have:  A feeling of pins and needles in your feet or legs.  Open sores, red spots, or other skin changes on your feet or legs.  Swelling or pain that gets worse. Get help right away if you have:  Numbness or tingling in your lower legs that does not get better right after you take the stockings off.  Toes or feet that are unusually cold or turn a bluish color.  A warm or red area on your leg.  New swelling or soreness in your leg.  Shortness of  breath.  Chest pain.  A fast or irregular heartbeat.  Light-headedness.  Dizziness. Summary  Compression stockings are elastic socks that squeeze the legs.  They help increase blood flow (circulation) to the legs, decrease swelling in the legs, and reduce the chance of developing blood clots in the lower legs.  Follow instructions from your health care provider about how and when to wear your compression stockings.  Do not stop wearing your compression stockings without talking to your health care provider first. This information is not intended to replace advice given to you by your health care provider. Make sure you discuss any questions you have with your health care provider. Document Released: 02/23/2009 Document Revised: 04/30/2017 Document Reviewed: 04/30/2017 Elsevier Interactive Patient Education  2019 [employer].

## 2018-04-30 ENCOUNTER — Encounter: Payer: Self-pay | Admitting: Cardiovascular Disease

## 2018-05-09 ENCOUNTER — Other Ambulatory Visit: Payer: Self-pay | Admitting: Cardiovascular Disease

## 2018-05-26 DIAGNOSIS — R300 Dysuria: Secondary | ICD-10-CM | POA: Diagnosis not present

## 2018-07-05 DIAGNOSIS — R918 Other nonspecific abnormal finding of lung field: Secondary | ICD-10-CM | POA: Diagnosis not present

## 2018-07-05 DIAGNOSIS — R911 Solitary pulmonary nodule: Secondary | ICD-10-CM | POA: Diagnosis not present

## 2018-07-19 DIAGNOSIS — J4540 Moderate persistent asthma, uncomplicated: Secondary | ICD-10-CM | POA: Diagnosis not present

## 2018-07-19 DIAGNOSIS — K219 Gastro-esophageal reflux disease without esophagitis: Secondary | ICD-10-CM | POA: Diagnosis not present

## 2018-07-19 DIAGNOSIS — N183 Chronic kidney disease, stage 3 (moderate): Secondary | ICD-10-CM | POA: Diagnosis not present

## 2018-07-25 ENCOUNTER — Other Ambulatory Visit: Payer: Self-pay | Admitting: Family Medicine

## 2018-07-25 ENCOUNTER — Other Ambulatory Visit: Payer: Self-pay | Admitting: Cardiovascular Disease

## 2018-07-26 ENCOUNTER — Other Ambulatory Visit: Payer: Self-pay | Admitting: Family Medicine

## 2018-08-12 ENCOUNTER — Other Ambulatory Visit: Payer: Self-pay | Admitting: Cardiovascular Disease

## 2018-08-12 MED ORDER — NEBIVOLOL HCL 5 MG PO TABS: 5.0000 mg | ORAL_TABLET | Freq: Every day | ORAL | 2 refills | Status: DC

## 2018-08-12 NOTE — Telephone Encounter (Signed)
Pt's medication was sent to pt's pharmacy as requested. Confirmation received.  °

## 2018-08-24 ENCOUNTER — Other Ambulatory Visit: Payer: Self-pay | Admitting: Cardiovascular Disease

## 2018-08-25 ENCOUNTER — Other Ambulatory Visit: Payer: Self-pay | Admitting: Family Medicine

## 2018-08-25 NOTE — Telephone Encounter (Signed)
Aldactone 25 mg refilled.

## 2018-09-28 ENCOUNTER — Other Ambulatory Visit: Payer: Self-pay | Admitting: Family Medicine

## 2018-10-21 IMAGING — DX DG SHOULDER 2+V*L*
3 series · 3 of 3 positions shown · non-contrast
Comparison: None.

CLINICAL DATA: Status post fall 1 hour ago

EXAM:
LEFT SHOULDER - 2+ VIEW

[shoulder grashey]
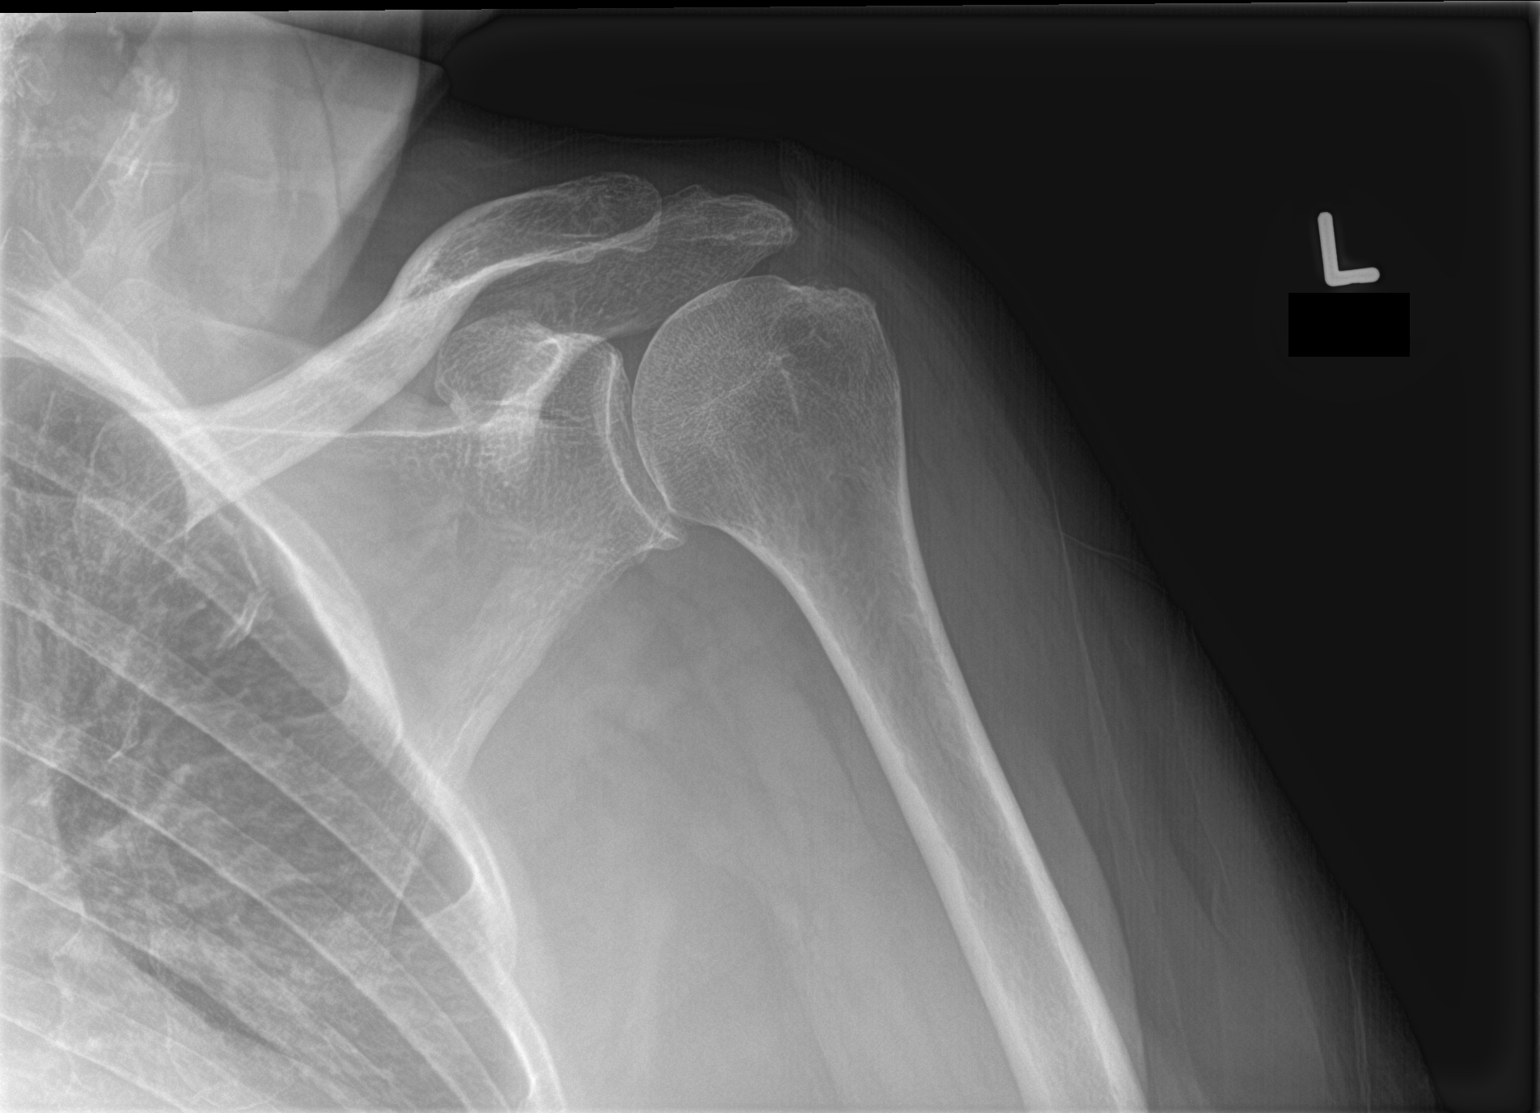

[shoulder ap neutral]
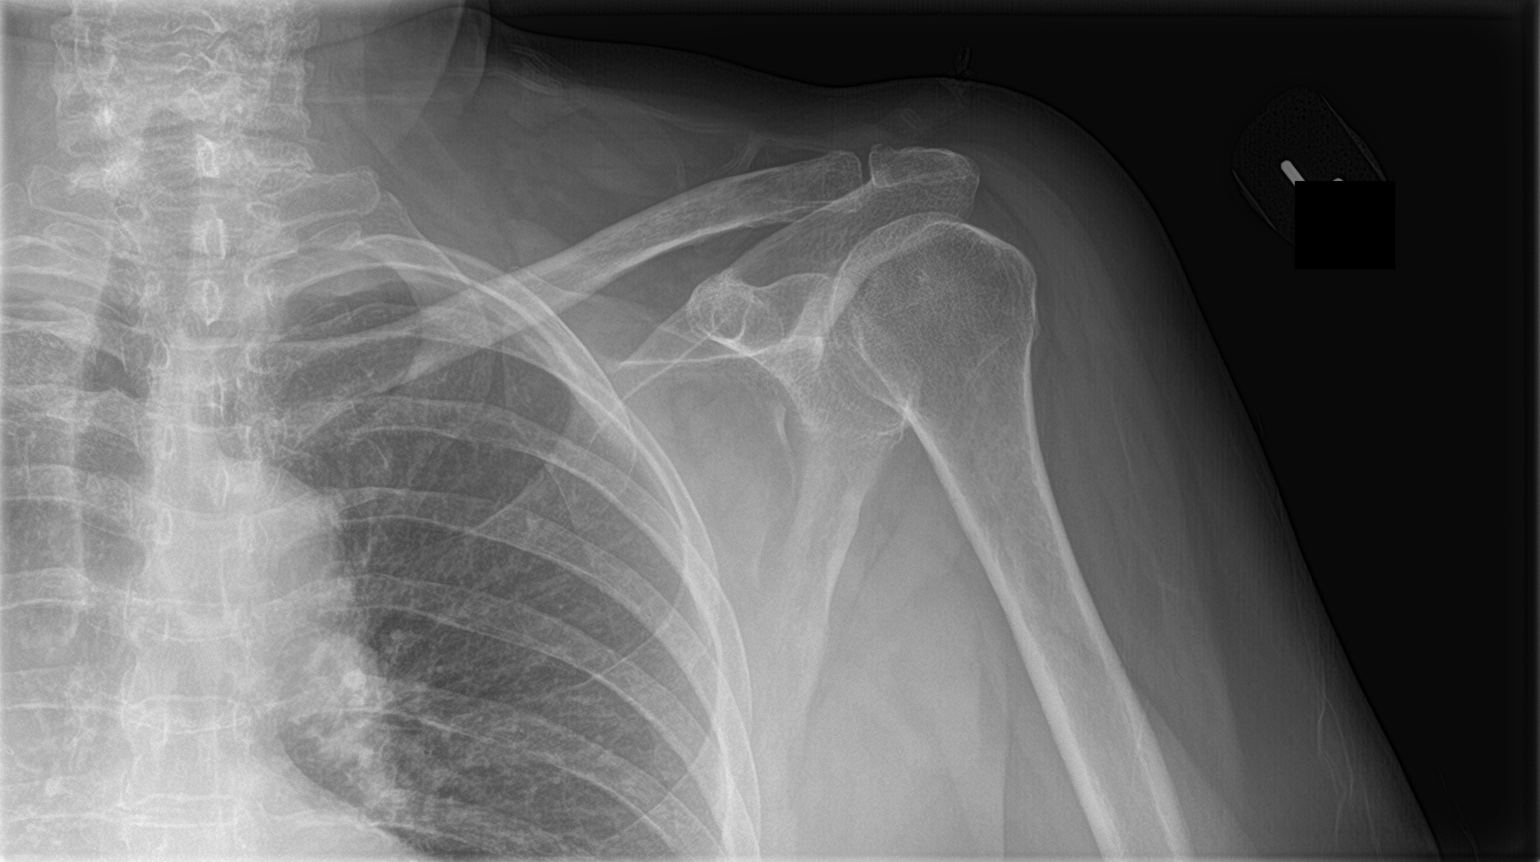

[shoulder y view]
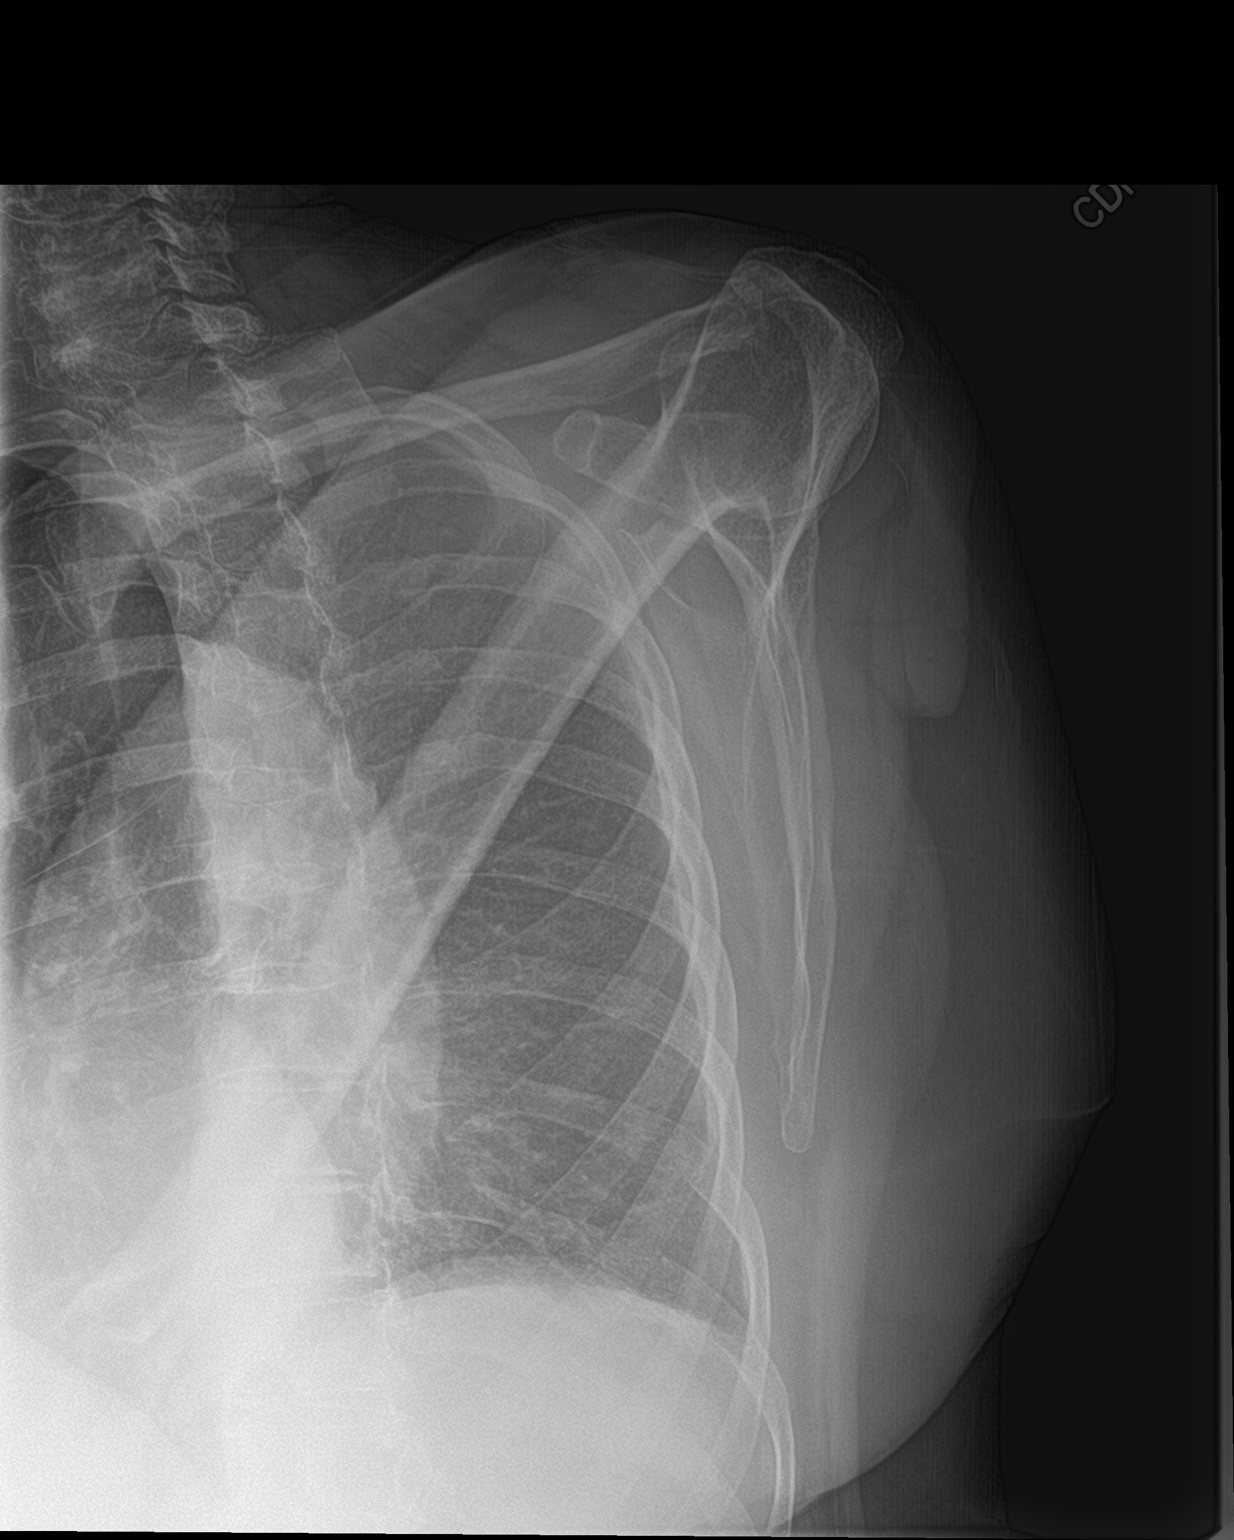

[3 of 3 positions shown; findings below may reference images not displayed]

FINDINGS: There is no evidence of fracture or dislocation. There is no
evidence of arthropathy or other focal bone abnormality. Soft
tissues are unremarkable.
IMPRESSION: Negative.

## 2018-10-25 DIAGNOSIS — M9903 Segmental and somatic dysfunction of lumbar region: Secondary | ICD-10-CM | POA: Diagnosis not present

## 2018-10-25 DIAGNOSIS — M545 Low back pain: Secondary | ICD-10-CM | POA: Diagnosis not present

## 2018-10-25 DIAGNOSIS — M9902 Segmental and somatic dysfunction of thoracic region: Secondary | ICD-10-CM | POA: Diagnosis not present

## 2018-10-25 DIAGNOSIS — M9904 Segmental and somatic dysfunction of sacral region: Secondary | ICD-10-CM | POA: Diagnosis not present

## 2018-10-28 DIAGNOSIS — M545 Low back pain: Secondary | ICD-10-CM | POA: Diagnosis not present

## 2018-10-28 DIAGNOSIS — M9902 Segmental and somatic dysfunction of thoracic region: Secondary | ICD-10-CM | POA: Diagnosis not present

## 2018-10-28 DIAGNOSIS — M9903 Segmental and somatic dysfunction of lumbar region: Secondary | ICD-10-CM | POA: Diagnosis not present

## 2018-10-28 DIAGNOSIS — M9904 Segmental and somatic dysfunction of sacral region: Secondary | ICD-10-CM | POA: Diagnosis not present

## 2018-11-01 ENCOUNTER — Other Ambulatory Visit: Payer: Self-pay | Admitting: Family Medicine

## 2018-11-01 DIAGNOSIS — M9904 Segmental and somatic dysfunction of sacral region: Secondary | ICD-10-CM | POA: Diagnosis not present

## 2018-11-01 DIAGNOSIS — M9903 Segmental and somatic dysfunction of lumbar region: Secondary | ICD-10-CM | POA: Diagnosis not present

## 2018-11-01 DIAGNOSIS — M9902 Segmental and somatic dysfunction of thoracic region: Secondary | ICD-10-CM | POA: Diagnosis not present

## 2018-11-01 DIAGNOSIS — M545 Low back pain: Secondary | ICD-10-CM | POA: Diagnosis not present

## 2018-11-03 DIAGNOSIS — M9902 Segmental and somatic dysfunction of thoracic region: Secondary | ICD-10-CM | POA: Diagnosis not present

## 2018-11-03 DIAGNOSIS — M545 Low back pain: Secondary | ICD-10-CM | POA: Diagnosis not present

## 2018-11-03 DIAGNOSIS — M9904 Segmental and somatic dysfunction of sacral region: Secondary | ICD-10-CM | POA: Diagnosis not present

## 2018-11-03 DIAGNOSIS — M9903 Segmental and somatic dysfunction of lumbar region: Secondary | ICD-10-CM | POA: Diagnosis not present

## 2018-11-08 DIAGNOSIS — M9902 Segmental and somatic dysfunction of thoracic region: Secondary | ICD-10-CM | POA: Diagnosis not present

## 2018-11-08 DIAGNOSIS — M9903 Segmental and somatic dysfunction of lumbar region: Secondary | ICD-10-CM | POA: Diagnosis not present

## 2018-11-08 DIAGNOSIS — M9904 Segmental and somatic dysfunction of sacral region: Secondary | ICD-10-CM | POA: Diagnosis not present

## 2018-11-08 DIAGNOSIS — M545 Low back pain: Secondary | ICD-10-CM | POA: Diagnosis not present

## 2018-11-11 DIAGNOSIS — M9904 Segmental and somatic dysfunction of sacral region: Secondary | ICD-10-CM | POA: Diagnosis not present

## 2018-11-11 DIAGNOSIS — M9903 Segmental and somatic dysfunction of lumbar region: Secondary | ICD-10-CM | POA: Diagnosis not present

## 2018-11-11 DIAGNOSIS — M9902 Segmental and somatic dysfunction of thoracic region: Secondary | ICD-10-CM | POA: Diagnosis not present

## 2018-11-11 DIAGNOSIS — M545 Low back pain: Secondary | ICD-10-CM | POA: Diagnosis not present

## 2018-11-15 DIAGNOSIS — M9904 Segmental and somatic dysfunction of sacral region: Secondary | ICD-10-CM | POA: Diagnosis not present

## 2018-11-15 DIAGNOSIS — M545 Low back pain: Secondary | ICD-10-CM | POA: Diagnosis not present

## 2018-11-15 DIAGNOSIS — M9902 Segmental and somatic dysfunction of thoracic region: Secondary | ICD-10-CM | POA: Diagnosis not present

## 2018-11-15 DIAGNOSIS — M9903 Segmental and somatic dysfunction of lumbar region: Secondary | ICD-10-CM | POA: Diagnosis not present

## 2018-11-18 DIAGNOSIS — M9903 Segmental and somatic dysfunction of lumbar region: Secondary | ICD-10-CM | POA: Diagnosis not present

## 2018-11-18 DIAGNOSIS — M9902 Segmental and somatic dysfunction of thoracic region: Secondary | ICD-10-CM | POA: Diagnosis not present

## 2018-11-18 DIAGNOSIS — M545 Low back pain: Secondary | ICD-10-CM | POA: Diagnosis not present

## 2018-11-18 DIAGNOSIS — M9904 Segmental and somatic dysfunction of sacral region: Secondary | ICD-10-CM | POA: Diagnosis not present

## 2018-11-22 DIAGNOSIS — M9902 Segmental and somatic dysfunction of thoracic region: Secondary | ICD-10-CM | POA: Diagnosis not present

## 2018-11-22 DIAGNOSIS — M9904 Segmental and somatic dysfunction of sacral region: Secondary | ICD-10-CM | POA: Diagnosis not present

## 2018-11-22 DIAGNOSIS — M545 Low back pain: Secondary | ICD-10-CM | POA: Diagnosis not present

## 2018-11-22 DIAGNOSIS — M9903 Segmental and somatic dysfunction of lumbar region: Secondary | ICD-10-CM | POA: Diagnosis not present

## 2018-11-24 DIAGNOSIS — E039 Hypothyroidism, unspecified: Secondary | ICD-10-CM | POA: Diagnosis not present

## 2018-11-24 DIAGNOSIS — N183 Chronic kidney disease, stage 3 (moderate): Secondary | ICD-10-CM | POA: Diagnosis not present

## 2018-11-24 DIAGNOSIS — K219 Gastro-esophageal reflux disease without esophagitis: Secondary | ICD-10-CM | POA: Diagnosis not present

## 2018-11-24 DIAGNOSIS — J4540 Moderate persistent asthma, uncomplicated: Secondary | ICD-10-CM | POA: Diagnosis not present

## 2018-11-24 DIAGNOSIS — I129 Hypertensive chronic kidney disease with stage 1 through stage 4 chronic kidney disease, or unspecified chronic kidney disease: Secondary | ICD-10-CM | POA: Diagnosis not present

## 2018-11-24 DIAGNOSIS — Z79899 Other long term (current) drug therapy: Secondary | ICD-10-CM | POA: Diagnosis not present

## 2018-11-24 DIAGNOSIS — M47817 Spondylosis without myelopathy or radiculopathy, lumbosacral region: Secondary | ICD-10-CM | POA: Diagnosis not present

## 2018-11-24 DIAGNOSIS — M47896 Other spondylosis, lumbar region: Secondary | ICD-10-CM | POA: Diagnosis not present

## 2018-11-24 DIAGNOSIS — E782 Mixed hyperlipidemia: Secondary | ICD-10-CM | POA: Diagnosis not present

## 2018-11-24 DIAGNOSIS — M47898 Other spondylosis, sacral and sacrococcygeal region: Secondary | ICD-10-CM | POA: Diagnosis not present

## 2018-11-29 DIAGNOSIS — M9902 Segmental and somatic dysfunction of thoracic region: Secondary | ICD-10-CM | POA: Diagnosis not present

## 2018-11-29 DIAGNOSIS — M9904 Segmental and somatic dysfunction of sacral region: Secondary | ICD-10-CM | POA: Diagnosis not present

## 2018-11-29 DIAGNOSIS — M545 Low back pain: Secondary | ICD-10-CM | POA: Diagnosis not present

## 2018-11-29 DIAGNOSIS — M9903 Segmental and somatic dysfunction of lumbar region: Secondary | ICD-10-CM | POA: Diagnosis not present

## 2018-12-01 DIAGNOSIS — M545 Low back pain: Secondary | ICD-10-CM | POA: Diagnosis not present

## 2018-12-01 DIAGNOSIS — M9903 Segmental and somatic dysfunction of lumbar region: Secondary | ICD-10-CM | POA: Diagnosis not present

## 2018-12-01 DIAGNOSIS — M9904 Segmental and somatic dysfunction of sacral region: Secondary | ICD-10-CM | POA: Diagnosis not present

## 2018-12-01 DIAGNOSIS — M9902 Segmental and somatic dysfunction of thoracic region: Secondary | ICD-10-CM | POA: Diagnosis not present

## 2018-12-06 DIAGNOSIS — M9904 Segmental and somatic dysfunction of sacral region: Secondary | ICD-10-CM | POA: Diagnosis not present

## 2018-12-06 DIAGNOSIS — M9903 Segmental and somatic dysfunction of lumbar region: Secondary | ICD-10-CM | POA: Diagnosis not present

## 2018-12-06 DIAGNOSIS — M9902 Segmental and somatic dysfunction of thoracic region: Secondary | ICD-10-CM | POA: Diagnosis not present

## 2018-12-06 DIAGNOSIS — M545 Low back pain: Secondary | ICD-10-CM | POA: Diagnosis not present

## 2018-12-13 ENCOUNTER — Telehealth: Payer: Self-pay | Admitting: Cardiovascular Disease

## 2018-12-13 NOTE — Telephone Encounter (Signed)
New message   STAT if patient feels like he/she is going to faint   1) Are you dizzy now? No   2) Do you feel faint or have you passed out?no   3) Do you have any other symptoms? Dizziness, can't focus, have problems being able to steady self   4) Have you checked your HR and BP (record if available)?no

## 2018-12-13 NOTE — Telephone Encounter (Signed)
Patient feels better, feels drained. Not as steady on her feet, but getting better. She did not wish to schedule visit now but will call back end of this week if no improvement. She does not want to see her PCP

## 2018-12-13 NOTE — Telephone Encounter (Signed)
Spoke with patient of Dr. Claiborne Billings who reports that it felt like there was a tight rubber band around her left leg that started Saturday night and woke her up. When she tried to get up to go to bathroom, she was off balance. She reports dizziness, not being able to focus and a little speech difficulty. She has no BP/HR readings to provide. Explained could be concerning for TIA and suggested PCP or urgent care follow up. She has a new PCP so she does not want to follow up there.   Will route to Dr. Claiborne Billings for any suggestions.

## 2018-12-13 NOTE — Telephone Encounter (Signed)
Agree with office visit with Medicare.  However if patient not feel comfortable with this okay to arrange for office visit with APP next week

## 2018-12-14 NOTE — Telephone Encounter (Signed)
Noted. Thanks.

## 2018-12-15 DIAGNOSIS — M9904 Segmental and somatic dysfunction of sacral region: Secondary | ICD-10-CM | POA: Diagnosis not present

## 2018-12-15 DIAGNOSIS — M9902 Segmental and somatic dysfunction of thoracic region: Secondary | ICD-10-CM | POA: Diagnosis not present

## 2018-12-15 DIAGNOSIS — M9903 Segmental and somatic dysfunction of lumbar region: Secondary | ICD-10-CM | POA: Diagnosis not present

## 2018-12-15 DIAGNOSIS — M545 Low back pain: Secondary | ICD-10-CM | POA: Diagnosis not present

## 2018-12-20 DIAGNOSIS — M9904 Segmental and somatic dysfunction of sacral region: Secondary | ICD-10-CM | POA: Diagnosis not present

## 2018-12-20 DIAGNOSIS — M9902 Segmental and somatic dysfunction of thoracic region: Secondary | ICD-10-CM | POA: Diagnosis not present

## 2018-12-20 DIAGNOSIS — M9903 Segmental and somatic dysfunction of lumbar region: Secondary | ICD-10-CM | POA: Diagnosis not present

## 2018-12-20 DIAGNOSIS — M545 Low back pain: Secondary | ICD-10-CM | POA: Diagnosis not present

## 2018-12-23 DIAGNOSIS — M545 Low back pain: Secondary | ICD-10-CM | POA: Diagnosis not present

## 2018-12-23 DIAGNOSIS — M9902 Segmental and somatic dysfunction of thoracic region: Secondary | ICD-10-CM | POA: Diagnosis not present

## 2018-12-23 DIAGNOSIS — M9903 Segmental and somatic dysfunction of lumbar region: Secondary | ICD-10-CM | POA: Diagnosis not present

## 2018-12-23 DIAGNOSIS — M9904 Segmental and somatic dysfunction of sacral region: Secondary | ICD-10-CM | POA: Diagnosis not present

## 2018-12-29 DIAGNOSIS — M545 Low back pain: Secondary | ICD-10-CM | POA: Diagnosis not present

## 2018-12-29 DIAGNOSIS — M9902 Segmental and somatic dysfunction of thoracic region: Secondary | ICD-10-CM | POA: Diagnosis not present

## 2018-12-29 DIAGNOSIS — M9903 Segmental and somatic dysfunction of lumbar region: Secondary | ICD-10-CM | POA: Diagnosis not present

## 2018-12-29 DIAGNOSIS — M9904 Segmental and somatic dysfunction of sacral region: Secondary | ICD-10-CM | POA: Diagnosis not present

## 2019-01-05 DIAGNOSIS — M9904 Segmental and somatic dysfunction of sacral region: Secondary | ICD-10-CM | POA: Diagnosis not present

## 2019-01-05 DIAGNOSIS — M545 Low back pain: Secondary | ICD-10-CM | POA: Diagnosis not present

## 2019-01-05 DIAGNOSIS — M9902 Segmental and somatic dysfunction of thoracic region: Secondary | ICD-10-CM | POA: Diagnosis not present

## 2019-01-05 DIAGNOSIS — M9903 Segmental and somatic dysfunction of lumbar region: Secondary | ICD-10-CM | POA: Diagnosis not present

## 2019-01-13 DIAGNOSIS — M9902 Segmental and somatic dysfunction of thoracic region: Secondary | ICD-10-CM | POA: Diagnosis not present

## 2019-01-13 DIAGNOSIS — M9903 Segmental and somatic dysfunction of lumbar region: Secondary | ICD-10-CM | POA: Diagnosis not present

## 2019-01-13 DIAGNOSIS — M545 Low back pain: Secondary | ICD-10-CM | POA: Diagnosis not present

## 2019-01-13 DIAGNOSIS — M9904 Segmental and somatic dysfunction of sacral region: Secondary | ICD-10-CM | POA: Diagnosis not present

## 2019-01-26 DIAGNOSIS — M545 Low back pain: Secondary | ICD-10-CM | POA: Diagnosis not present

## 2019-01-26 DIAGNOSIS — M9902 Segmental and somatic dysfunction of thoracic region: Secondary | ICD-10-CM | POA: Diagnosis not present

## 2019-01-26 DIAGNOSIS — M9903 Segmental and somatic dysfunction of lumbar region: Secondary | ICD-10-CM | POA: Diagnosis not present

## 2019-01-26 DIAGNOSIS — M9904 Segmental and somatic dysfunction of sacral region: Secondary | ICD-10-CM | POA: Diagnosis not present

## 2019-03-14 ENCOUNTER — Other Ambulatory Visit: Payer: Self-pay | Admitting: Women's Health

## 2019-03-14 DIAGNOSIS — Z1231 Encounter for screening mammogram for malignant neoplasm of breast: Secondary | ICD-10-CM

## 2019-03-21 ENCOUNTER — Other Ambulatory Visit: Payer: Self-pay | Admitting: Cardiovascular Disease

## 2019-04-13 ENCOUNTER — Other Ambulatory Visit: Payer: Self-pay | Admitting: Cardiovascular Disease

## 2019-04-25 ENCOUNTER — Other Ambulatory Visit: Payer: Self-pay

## 2019-04-25 ENCOUNTER — Encounter: Payer: Self-pay | Admitting: Family Medicine

## 2019-04-25 ENCOUNTER — Ambulatory Visit (INDEPENDENT_AMBULATORY_CARE_PROVIDER_SITE_OTHER): Payer: Medicare Other | Admitting: Family Medicine

## 2019-04-25 DIAGNOSIS — T7800XD Anaphylactic reaction due to unspecified food, subsequent encounter: Secondary | ICD-10-CM

## 2019-04-25 DIAGNOSIS — J4540 Moderate persistent asthma, uncomplicated: Secondary | ICD-10-CM

## 2019-04-25 DIAGNOSIS — J3089 Other allergic rhinitis: Secondary | ICD-10-CM | POA: Diagnosis not present

## 2019-04-25 MED ORDER — FLUTICASONE PROPIONATE 50 MCG/ACT NA SUSP: 1.0000 | Freq: Every day | NASAL | 5 refills | Status: AC | PRN

## 2019-04-25 MED ORDER — ALBUTEROL SULFATE HFA 108 (90 BASE) MCG/ACT IN AERS: 2.0000 | INHALATION_SPRAY | RESPIRATORY_TRACT | 1 refills | Status: AC | PRN

## 2019-04-25 MED ORDER — MONTELUKAST SODIUM 10 MG PO TABS: 10.0000 mg | ORAL_TABLET | Freq: Every day | ORAL | 5 refills | Status: AC

## 2019-04-25 MED ORDER — BREO ELLIPTA 100-25 MCG/INH IN AEPB: INHALATION_SPRAY | RESPIRATORY_TRACT | 5 refills | Status: AC

## 2019-04-25 NOTE — Progress Notes (Deleted)
RE: Mary Roth MRN: [id number] DOB: [birth year] Date of Telemedicine Visit: 04/25/2019  Referring provider: Margaretmary Bayley, MD Primary care provider: Margaretmary Bayley, MD  Chief Complaint: Asthma   Telemedicine Follow Up Visit via Telephone: I connected with Mary Roth for a follow up on 04/25/19 by telephone and verified that I am speaking with the correct person using two identifiers.   I discussed the limitations, risks, security and privacy concerns of performing an evaluation and management service by telephone and the availability of in person appointments. I also discussed with the patient that there may be a patient responsible charge related to this service. The patient expressed understanding and agreed to proceed.  Patient is at *** accompanied by *** who provided/contributed to the history.  Provider is at the office.  Visit start time: *** Visit end time: *** Insurance consent/check in by: *** Medical consent and medical assistant/nurse: ***  History of Present Illness: She is a 85 y.o. female, who is being followed for ***. Her previous allergy office visit was on *** with {Blank single:19197::"Dr. Kim","Dr. Kozlow","Dr. Jamesrobert Ohanesian","Dr. Bobbitt","Dr. Gallagher","Dr. Padgett","Anne Ambs, FNP"}.   ***  Assessment and Plan: Ronnell is a 85 y.o. female with: No problem-specific Assessment & Plan notes found for this encounter.  Return in about 6 months (around 10/24/2019), or if symptoms worsen or fail to improve.  Meds ordered this encounter  Medications  . montelukast (SINGULAIR) 10 MG tablet    Sig: Take 1 tablet (10 mg total) by mouth at bedtime.    Dispense:  30 tablet    Refill:  5  . fluticasone furoate-vilanterol (BREO ELLIPTA) 100-25 MCG/INH AEPB    Sig: INHALE 1 PUFF INTO THE LUNGS BY MOUTH ONCE A DAY    Dispense:  1 each    Refill:  5  . fluticasone (FLONASE) 50 MCG/ACT nasal spray    Sig: Place 1 spray into both nostrils daily as needed.   Dispense:  16 g    Refill:  5  . albuterol (PROAIR HFA) 108 (90 Base) MCG/ACT inhaler    Sig: Inhale 2 puffs into the lungs every 4 (four) hours as needed for wheezing or shortness of breath.    Dispense:  18 g    Refill:  1    Lab Orders     Alpha-Gal Panel  Diagnostics: None.  Medication List:  Current Outpatient Medications  Medication Sig Dispense Refill  . albuterol (PROAIR HFA) 108 (90 Base) MCG/ACT inhaler Inhale 2 puffs into the lungs every 4 (four) hours as needed for wheezing or shortness of breath. 18 g 1  . albuterol (PROVENTIL) (2.5 MG/3ML) 0.083% nebulizer solution One ampule in nebulizer every 4 hours if needed for cough or wheeze. 75 mL 1  . amLODipine (NORVASC) 2.5 MG tablet TAKE ONE TABLET BY MOUTH ONE TIME DAILY  30 tablet 8  . cetirizine (ZYRTEC ALLERGY) 10 MG tablet Take 10 mg by mouth daily.    [name] EPINEPHrine 0.3 mg/0.3 mL IJ SOAJ injection Use as directed for severe allergic reaction. 2 Device 2  . esomeprazole (NEXIUM) 40 MG capsule Take by mouth.    . fexofenadine (ALLEGRA) 180 MG tablet Take 180 mg by mouth daily as needed (asthma).     . fluticasone (FLONASE) 50 MCG/ACT nasal spray Place 1 spray into both nostrils daily as needed. 16 g 5  . fluticasone furoate-vilanterol (BREO ELLIPTA) 100-25 MCG/INH AEPB INHALE 1 PUFF INTO THE LUNGS BY MOUTH ONCE A DAY 1 each 5  .  Fluticasone-Salmeterol (ADVAIR) 250-50 MCG/DOSE AEPB Inhale 1 puff into the lungs daily as needed (if asthma flare).     [name] levothyroxine (SYNTHROID, LEVOTHROID) 88 MCG tablet Take by mouth.    . montelukast (SINGULAIR) 10 MG tablet Take 1 tablet (10 mg total) by mouth at bedtime. 30 tablet 5  . nebivolol (BYSTOLIC) 5 MG tablet Take 1 tablet (5 mg total) by mouth daily. PLEASE CALL TO SCHEDULE APPOINTMENT FOR FURTHER REFILLS. 30 tablet 0  . rOPINIRole (REQUIP) 0.25 MG tablet Take 1 tablet (0.25 mg total) by mouth at bedtime. 1 pill 2 hrs pre bedtime (Patient taking differently: Take 0.25 mg by mouth  as needed. 1 pill 2 hrs pre bedtime) 30 tablet 2  . rosuvastatin (CRESTOR) 5 MG tablet Take 1 tablet (5 mg total) by mouth at bedtime. NEED OV. 90 tablet 0  . spironolactone (ALDACTONE) 25 MG tablet Take 1 tablet (25 mg total) by mouth daily. NEED OV. 90 tablet 0  . furosemide (LASIX) 20 MG tablet Take 1 tablet (20 mg total) by mouth as needed for edema. (Patient not taking: Reported on 04/25/2019) 30 tablet 3  . nitroGLYCERIN (NITROSTAT) 0.4 MG SL tablet Place 1 tablet (0.4 mg total) under the tongue every 15 (fifteen) minutes as needed for chest pain. 90 tablet 3   No current facility-administered medications for this visit.   Allergies: Allergies  Allergen Reactions  . Beef (Bovine) Protein Anaphylaxis    History of Alpha Gal : Hoff meat allergy.  . Goat's Rue  [Galega] Anaphylaxis  . Lambs Quarters Anaphylaxis  . Other Anaphylaxis  . Poractant Alfa Anaphylaxis  . Pork Allergy Anaphylaxis  . Pork-Derived Products Anaphylaxis  . Phenergan [Promethazine Hcl] Nausea And Vomiting   I reviewed her past medical history, social history, family history, and environmental history and no significant changes have been reported from previous visit on ***.  Review of Systems Objective: Physical Exam Not obtained as encounter was done via telephone.   Previous notes and tests were reviewed.  I discussed the assessment and treatment plan with the patient. The patient was provided an opportunity to ask questions and all were answered. The patient agreed with the plan and demonstrated an understanding of the instructions.   The patient was advised to call back or seek an in-person evaluation if the symptoms worsen or if the condition fails to improve as anticipated.  I provided *** minutes of non-face-to-face time during this encounter.  It was my pleasure to participate in Mary Roth's care today. Please feel free to contact me with any questions or concerns.   Sincerely,  Carlena Bjornstad, MD

## 2019-04-25 NOTE — Patient Instructions (Signed)
Asthma Continue Breo Ellipta 100- 1 puff once a day to prevent cough or wheeze Continue montelukast 10 mg once a dat to prevent cough or wheeze ProAir 2 puffs as needed for cough or wheeze.  You may use ProAir 2 puffs 5-15 minutes before exercise to prevent cough or wheeze  Allergic rhinitis Continue Flonase 1-2 sprays in each nostril once a day as needed for a stuffy nose  Alpha gal allergy Continue to avoid beef, lamb, pork, . In case of an allergic reaction, give Benadryl 50 mg every 4 hours, and if life-threatening symptoms occur, inject with EpiPen 0.3 mg. I have placed orders for lab work to further evaluate your alpha gal allergy. We will call you as soon as results are received.   Continue the medications as listed in your chart  Call us if this treatment plan is not working well for you  Follow up in 6 months or sooner if needed

## 2019-04-25 NOTE — Progress Notes (Signed)
RE: Mary Roth MRN: [id number] DOB: Apr 04, 1929 Date of Telemedicine Visit: 04/25/2019  Referring provider: Margaretmary Bayley, MD Primary care provider: Margaretmary Bayley, MD  Chief Complaint: Asthma   Telemedicine Follow Up Visit via Telephone: I connected with Mary Roth for a follow up on 04/25/19 by telephone and verified that I am speaking with the correct person using two identifiers.   I discussed the limitations, risks, security and privacy concerns of performing an evaluation and management service by telephone and the availability of in person appointments. I also discussed with the patient that there may be a patient responsible charge related to this service. The patient expressed understanding and agreed to proceed.  Patient is at home  Provider is at the office.  Visit start time: 1:30 Visit end time: 1:58 Insurance consent/check in by: Caroline consent and medical assistant/nurse: Damita G  History of Present Illness: She is a 85 y.o. female, who is being followed for asthma, allergic rhinitis, and alpha gal allergy. Her previous allergy office visit was on 01/26/2018 with Gareth Morgan, [place]. At today's visit, she reports her asthma has been well controlled with no shortness of breath, cough, or wheeze with activity or rest. She continues Breo Ellipta 100-1 puff once a day, montelukast 10 mg once a day, and albuterol about 1-2 times a month with relief of symptoms. Allergic rhinitis is reported as well controlled with Flonase as needed. She denies accidental ingestion of mammalian products over the last year and has not needed to use her EpiPen. Her current medications are listed in the chart.  Assessment and Plan: Mary Roth is a 85 y.o. female with: Patient Instructions  Asthma Continue Breo Ellipta 100- 1 puff once a day to prevent cough or wheeze Continue montelukast 10 mg once a dat to prevent cough or wheeze ProAir 2 puffs as needed for cough or wheeze.  You  may use ProAir 2 puffs 5-15 minutes before exercise to prevent cough or wheeze  Allergic rhinitis Continue Flonase 1-2 sprays in each nostril once a day as needed for a stuffy nose  Alpha gal allergy Continue to avoid beef, lamb, pork, . In case of an allergic reaction, give Benadryl 50 mg every 4 hours, and if life-threatening symptoms occur, inject with EpiPen 0.3 mg. I have placed orders for lab work to further evaluate your alpha gal allergy. We will call you as soon as results are received.   Continue the medications as listed in your chart  Call us if this treatment plan is not working well for you  Follow up in 6 months or sooner if needed    Return in about 6 months (around 10/24/2019), or if symptoms worsen or fail to improve.  Meds ordered this encounter  Medications  . montelukast (SINGULAIR) 10 MG tablet    Sig: Take 1 tablet (10 mg total) by mouth at bedtime.    Dispense:  30 tablet    Refill:  5  . fluticasone furoate-vilanterol (BREO ELLIPTA) 100-25 MCG/INH AEPB    Sig: INHALE 1 PUFF INTO THE LUNGS BY MOUTH ONCE A DAY    Dispense:  1 each    Refill:  5  . fluticasone (FLONASE) 50 MCG/ACT nasal spray    Sig: Place 1 spray into both nostrils daily as needed.    Dispense:  16 g    Refill:  5  . albuterol (PROAIR HFA) 108 (90 Base) MCG/ACT inhaler    Sig: Inhale 2 puffs into the  lungs every 4 (four) hours as needed for wheezing or shortness of breath.    Dispense:  18 g    Refill:  1    Lab Orders     Alpha-Gal Panel   Medication List:  Current Outpatient Medications  Medication Sig Dispense Refill  . albuterol (PROAIR HFA) 108 (90 Base) MCG/ACT inhaler Inhale 2 puffs into the lungs every 4 (four) hours as needed for wheezing or shortness of breath. 18 g 1  . albuterol (PROVENTIL) (2.5 MG/3ML) 0.083% nebulizer solution One ampule in nebulizer every 4 hours if needed for cough or wheeze. 75 mL 1  . amLODipine (NORVASC) 2.5 MG tablet TAKE ONE TABLET BY MOUTH ONE  TIME DAILY  30 tablet 8  . cetirizine (ZYRTEC ALLERGY) 10 MG tablet Take 10 mg by mouth daily.    [name] EPINEPHrine 0.3 mg/0.3 mL IJ SOAJ injection Use as directed for severe allergic reaction. 2 Device 2  . esomeprazole (NEXIUM) 40 MG capsule Take by mouth.    . fexofenadine (ALLEGRA) 180 MG tablet Take 180 mg by mouth daily as needed (asthma).     . fluticasone (FLONASE) 50 MCG/ACT nasal spray Place 1 spray into both nostrils daily as needed. 16 g 5  . fluticasone furoate-vilanterol (BREO ELLIPTA) 100-25 MCG/INH AEPB INHALE 1 PUFF INTO THE LUNGS BY MOUTH ONCE A DAY 1 each 5  . Fluticasone-Salmeterol (ADVAIR) 250-50 MCG/DOSE AEPB Inhale 1 puff into the lungs daily as needed (if asthma flare).     [name] levothyroxine (SYNTHROID, LEVOTHROID) 88 MCG tablet Take by mouth.    . montelukast (SINGULAIR) 10 MG tablet Take 1 tablet (10 mg total) by mouth at bedtime. 30 tablet 5  . nebivolol (BYSTOLIC) 5 MG tablet Take 1 tablet (5 mg total) by mouth daily. PLEASE CALL TO SCHEDULE APPOINTMENT FOR FURTHER REFILLS. 30 tablet 0  . rOPINIRole (REQUIP) 0.25 MG tablet Take 1 tablet (0.25 mg total) by mouth at bedtime. 1 pill 2 hrs pre bedtime (Patient taking differently: Take 0.25 mg by mouth as needed. 1 pill 2 hrs pre bedtime) 30 tablet 2  . rosuvastatin (CRESTOR) 5 MG tablet Take 1 tablet (5 mg total) by mouth at bedtime. NEED OV. 90 tablet 0  . spironolactone (ALDACTONE) 25 MG tablet Take 1 tablet (25 mg total) by mouth daily. NEED OV. 90 tablet 0  . furosemide (LASIX) 20 MG tablet Take 1 tablet (20 mg total) by mouth as needed for edema. (Patient not taking: Reported on 04/25/2019) 30 tablet 3  . nitroGLYCERIN (NITROSTAT) 0.4 MG SL tablet Place 1 tablet (0.4 mg total) under the tongue every 15 (fifteen) minutes as needed for chest pain. 90 tablet 3   No current facility-administered medications for this visit.   Allergies: Allergies  Allergen Reactions  . Beef (Bovine) Protein Anaphylaxis    History of Alpha Gal  : Hoff meat allergy.  . Goat's Rue  [Galega] Anaphylaxis  . Lambs Quarters Anaphylaxis  . Other Anaphylaxis  . Poractant Alfa Anaphylaxis  . Pork Allergy Anaphylaxis  . Pork-Derived Products Anaphylaxis  . Phenergan [Promethazine Hcl] Nausea And Vomiting   I reviewed her past medical history, social history, family history, and environmental history and no significant changes have been reported from previous visit on 01/26/2018.  Objective: Physical Exam Not obtained as encounter was done via telephone.   Previous notes and tests were reviewed.  I discussed the assessment and treatment plan with the patient. The patient was provided an opportunity to ask questions and  all were answered. The patient agreed with the plan and demonstrated an understanding of the instructions.   The patient was advised to call back or seek an in-person evaluation if the symptoms worsen or if the condition fails to improve as anticipated.  I provided 28 minutes of non-face-to-face time during this encounter.  It was my pleasure to participate in Mary Roth's care today. Please feel free to contact me with any questions or concerns.   Sincerely,  Gareth Morgan, FNP  I have provided oversight concerning Gareth Morgan' evaluation and treatment of this patient's health issues addressed during today's encounter. I agree with the assessment and therapeutic plan as outlined in the note.   Thank you for the opportunity to care for this patient.  Please do not hesitate to contact me with questions.  Penne Lash, M.D.  [facility] [address] [place], [place] [address] [phone]

## 2019-05-02 LAB — ALPHA-GAL PANEL
Alpha Gal IgE*: 0.29 kU/L — ABNORMAL HIGH (ref <0.10)
Beef (Bos spp) IgE: 0.11 kU/L (ref <0.35)
Class Interpretation: 0
Class Interpretation: 0
Lamb/Mutton (Ovis spp) IgE: <0.1 kU/L (ref <0.35)
Pork (Sus spp) IgE: <0.1 kU/L (ref <0.35)

## 2019-05-02 NOTE — Progress Notes (Signed)
Can you please call this patient and let her know that her alpha gal is low enough to do an in office food challenge. Until that time, she should continue to avoid mammalian products and continue to have access to an EpiPen. Thank you

## 2019-05-03 ENCOUNTER — Other Ambulatory Visit: Payer: Self-pay

## 2019-05-03 ENCOUNTER — Ambulatory Visit
Admission: RE | Admit: 2019-05-03 | Discharge: 2019-05-03 | Disposition: A | Discharge status: Home / Self Care | Payer: Medicare Other | Source: Ambulatory Visit | Attending: Women's Health | Admitting: Women's Health

## 2019-05-03 DIAGNOSIS — Z1231 Encounter for screening mammogram for malignant neoplasm of breast: Secondary | ICD-10-CM | POA: Diagnosis not present

## 2019-05-16 ENCOUNTER — Other Ambulatory Visit: Payer: Self-pay | Admitting: Cardiovascular Disease

## 2019-05-17 DIAGNOSIS — I129 Hypertensive chronic kidney disease with stage 1 through stage 4 chronic kidney disease, or unspecified chronic kidney disease: Secondary | ICD-10-CM | POA: Diagnosis not present

## 2019-05-17 DIAGNOSIS — N183 Chronic kidney disease, stage 3 unspecified: Secondary | ICD-10-CM | POA: Diagnosis not present

## 2019-05-17 DIAGNOSIS — J4540 Moderate persistent asthma, uncomplicated: Secondary | ICD-10-CM | POA: Diagnosis not present

## 2019-05-17 DIAGNOSIS — I10 Essential (primary) hypertension: Secondary | ICD-10-CM | POA: Diagnosis not present

## 2019-05-17 DIAGNOSIS — N3000 Acute cystitis without hematuria: Secondary | ICD-10-CM | POA: Diagnosis not present

## 2019-05-17 DIAGNOSIS — E039 Hypothyroidism, unspecified: Secondary | ICD-10-CM | POA: Diagnosis not present

## 2019-05-19 ENCOUNTER — Other Ambulatory Visit: Payer: Self-pay | Admitting: Cardiovascular Disease

## 2019-05-19 DIAGNOSIS — M9902 Segmental and somatic dysfunction of thoracic region: Secondary | ICD-10-CM | POA: Diagnosis not present

## 2019-05-19 DIAGNOSIS — M546 Pain in thoracic spine: Secondary | ICD-10-CM | POA: Diagnosis not present

## 2019-05-19 DIAGNOSIS — M542 Cervicalgia: Secondary | ICD-10-CM | POA: Diagnosis not present

## 2019-05-19 DIAGNOSIS — M9901 Segmental and somatic dysfunction of cervical region: Secondary | ICD-10-CM | POA: Diagnosis not present

## 2019-05-24 ENCOUNTER — Encounter: Payer: Self-pay | Admitting: Physician Assistant

## 2019-05-24 ENCOUNTER — Ambulatory Visit: Payer: Medicare Other | Admitting: Physician Assistant

## 2019-05-24 ENCOUNTER — Other Ambulatory Visit: Payer: Self-pay

## 2019-05-24 VITALS — BP 112/65 | HR 77 | Temp 97.7°F | Ht <= 58 in | Wt 191.4 lb

## 2019-05-24 DIAGNOSIS — E785 Hyperlipidemia, unspecified: Secondary | ICD-10-CM

## 2019-05-24 DIAGNOSIS — R0600 Dyspnea, unspecified: Secondary | ICD-10-CM

## 2019-05-24 DIAGNOSIS — E039 Hypothyroidism, unspecified: Secondary | ICD-10-CM

## 2019-05-24 DIAGNOSIS — I10 Essential (primary) hypertension: Secondary | ICD-10-CM | POA: Diagnosis not present

## 2019-05-24 MED ORDER — ROSUVASTATIN CALCIUM 5 MG PO TABS: 5.0000 mg | ORAL_TABLET | Freq: Every day | ORAL | 3 refills | Status: DC

## 2019-05-24 MED ORDER — SPIRONOLACTONE 25 MG PO TABS: 25.0000 mg | ORAL_TABLET | Freq: Every day | ORAL | 3 refills | Status: DC

## 2019-05-24 MED ORDER — AMLODIPINE BESYLATE 2.5 MG PO TABS: 2.5000 mg | ORAL_TABLET | Freq: Every day | ORAL | 3 refills | Status: DC

## 2019-05-24 MED ORDER — NEBIVOLOL HCL 5 MG PO TABS: 5.0000 mg | ORAL_TABLET | Freq: Every day | ORAL | 3 refills | Status: DC

## 2019-05-24 NOTE — Progress Notes (Signed)
Cardiology Office Note:    Date:  05/26/2019   ID:  Mary Roth, DOB Feb 16, 1929, MRN [id number]  PCP:  Jenel Lucks, PA-C  Cardiologist:  Shelva Majestic, MD  Electrophysiologist:  None   Referring MD: Margaretmary Bayley, MD   Chief Complaint  Patient presents with  . Follow-up    seen for Dr. Claiborne Billings.     History of Present Illness:    Mary Roth is a 86 y.o. female with a hx of alpha galactosidase deficiency, hepatic steatosis, GERD, asthma, hyperlipidemia, hypertension and hypothyroidism.  She is a former patient of Dr. Megan Salon.  Echocardiogram in 2012 showed concentric LVH with normal systolic function, grade 1 DD, mild mitral annular calcification and aortic valve sclerosis.  Due to progressive dyspnea over the years, she had a repeat echocardiogram in February 2015 that showed normal EF 55 to 60%, grade 1 DD, no significant valvular issue.  Myoview obtained on 06/23/2013 was normal without scar or ischemia, EF 70%.  She had a cardiopulmonary stress test in November 2015 which demonstrated normal functional ability, achieved at maximal oxygen consumption of 92% of predicted, cardiovascular response to exercise was felt to be indeterminate due to suboptimal peak cardiovascular stress load.  She had blunted chronotropic response to exercise.  Pulmonary response was normal.  Exercise was limited by effort.  PFT revealed normal spirometry, lung volumes and the diffusion.  Her anaerobic threshold was low.  Patient had repeat Myoview in March 2018 for chest pain which came back normal.  Patient presents today for cardiology office visit.  She lives at [facility].  There was a small outbreak of 3 Covid cases over there however compared to most facility, [facility] enforcing strict rules on exercising outside.  She has not exercised much in the recent month therefore does feel she is a little more short of breath than usual.  She denies any chest pain.  I  did not see any significant lower extremity edema.  She has not used any Lasix recently.  Overall, her blood pressure is quite well controlled on the current therapy.  I recommended continue on the current medication.  She denies any orthopnea or PND.  She is overdue for fasting lipid panel and a liver function test, however she is not fasting today.  She is quite concerned about catching Covid, therefore I will not make her come back in the metabolic pandemic in order to obtain fasting lipid panel.  I recommended she obtain fasting lipid panel in 9 months and follow-up with Dr. Claiborne Billings after that.  Past Medical History:  Diagnosis Date  . Abnormal liver function test   . Alpha galactosidase deficiency   . Arthritis   . Asthma   . Chest pain    a. 06/2013 [facility] MV: no ischemia/infarct;  b. 12/2015 MV ([place]): EF 82%, no ischemia.  . DDD (degenerative disc disease), lumbar   . DDD (degenerative disc disease), lumbosacral   . Esophageal reflux   . Hepatic steatosis   . Mastodynia   . Murmur   . Osteopenia 03/2012   T score -1.4 FRAX 20%/11%; Dr Phineas Real  . Other and unspecified hyperlipidemia   . Personal history of colonic polyps 08/21/2006   ADENOMATOUS POLYP  . Progressive Dyspnea    a. 06/2013 Echo: EF 55-60%, Gr1 DD;  b. 03/2014 CPX testing: indeterminate cv response due to suboptimal cv stress load and blunted chronotropic response. Nl pulm response, nl PFTs;  c. 12/2015 Echo ([place]): EF 55-60%, no significant valve dzs.  [name] Spinal stenosis   . Unspecified asthma(493.90) 2003    no childhood asthma  . Unspecified essential hypertension   . Unspecified hypothyroidism     Past Surgical History:  Procedure Laterality Date  . APPENDECTOMY    . CHOLECYSTECTOMY  1973    for stones  . TONSILLECTOMY    . UTERINE SUSPENSION  [address] , [place]    Current Medications: Current Meds  Medication Sig  . albuterol (PROAIR HFA) 108 (90 Base) MCG/ACT inhaler Inhale 2 puffs into the  lungs every 4 (four) hours as needed for wheezing or shortness of breath.  [name] albuterol (PROVENTIL) (2.5 MG/3ML) 0.083% nebulizer solution One ampule in nebulizer every 4 hours if needed for cough or wheeze.  [name] amLODipine (NORVASC) 2.5 MG tablet Take 1 tablet (2.5 mg total) by mouth daily.  . cetirizine (ZYRTEC ALLERGY) 10 MG tablet Take 10 mg by mouth daily.  [name] EPINEPHrine 0.3 mg/0.3 mL IJ SOAJ injection Use as directed for severe allergic reaction.  [name] esomeprazole (NEXIUM) 40 MG capsule Take by mouth.  . fexofenadine (ALLEGRA) 180 MG tablet Take 180 mg by mouth daily as needed (asthma).   . fluticasone (FLONASE) 50 MCG/ACT nasal spray Place 1 spray into both nostrils daily as needed.  . fluticasone furoate-vilanterol (BREO ELLIPTA) 100-25 MCG/INH AEPB INHALE 1 PUFF INTO THE LUNGS BY MOUTH ONCE A DAY  . Fluticasone-Salmeterol (ADVAIR) 250-50 MCG/DOSE AEPB Inhale 1 puff into the lungs daily as needed (if asthma flare).   . furosemide (LASIX) 20 MG tablet Take 1 tablet (20 mg total) by mouth as needed for edema.  [name] levothyroxine (SYNTHROID, LEVOTHROID) 88 MCG tablet Take by mouth.  . montelukast (SINGULAIR) 10 MG tablet Take 1 tablet (10 mg total) by mouth at bedtime.  . nebivolol (BYSTOLIC) 5 MG tablet Take 1 tablet (5 mg total) by mouth daily.  [name] rOPINIRole (REQUIP) 0.25 MG tablet Take 1 tablet (0.25 mg total) by mouth at bedtime. 1 pill 2 hrs pre bedtime (Patient taking differently: Take 0.25 mg by mouth as needed. 1 pill 2 hrs pre bedtime)  . rosuvastatin (CRESTOR) 5 MG tablet Take 1 tablet (5 mg total) by mouth at bedtime.  [name] spironolactone (ALDACTONE) 25 MG tablet Take 1 tablet (25 mg total) by mouth daily. NEED OV.  . [DISCONTINUED] amLODipine (NORVASC) 2.5 MG tablet TAKE ONE TABLET BY MOUTH ONE TIME DAILY   . [DISCONTINUED] BYSTOLIC 5 MG tablet TAKE ONE TABLET BY MOUTH ONE TIME DAILY   . [DISCONTINUED] rosuvastatin (CRESTOR) 5 MG tablet Take 1 tablet (5 mg total) by mouth at bedtime. NEED  OV.  . [DISCONTINUED] spironolactone (ALDACTONE) 25 MG tablet Take 1 tablet (25 mg total) by mouth daily. NEED OV.     Allergies:   Beef (bovine) protein, Goat's rue  [galega], Lambs quarters, Other, Poractant alfa, Pork allergy, Pork-derived products, and Phenergan [promethazine hcl]   Social History   Socioeconomic History  . Marital status: Married    Spouse name: Not on file  . Number of children: 2  . Years of education: Not on file  . Highest education level: Not on file  Occupational History  . Occupation: homemaker    Employer: RETIRED  Tobacco Use  . Smoking status: Never Smoker  . Smokeless tobacco: Never Used  Substance and Sexual Activity  . Alcohol use: Yes    Alcohol/week: 4.0 standard drinks    Types: 4 Shots of liquor per week  Comment:  socially about 3 times a week.  drinks gin.  . Drug use: No  . Sexual activity: Yes  Other Topics Concern  . Not on file  Social History Narrative   Pt lives at [facility].   Social Determinants of Health   Financial Resource Strain:   . Difficulty of Paying Living Expenses: Not on file  Food Insecurity:   . Worried About Charity fundraiser in the Last Year: Not on file  . Ran Out of Food in the Last Year: Not on file  Transportation Needs:   . Lack of Transportation (Medical): Not on file  . Lack of Transportation (Non-Medical): Not on file  Physical Activity:   . Days of Exercise per Week: Not on file  . Minutes of Exercise per Session: Not on file  Stress:   . Feeling of Stress : Not on file  Social Connections:   . Frequency of Communication with Friends and Family: Not on file  . Frequency of Social Gatherings with Friends and Family: Not on file  . Attends Religious Services: Not on file  . Active Member of Clubs or Organizations: Not on file  . Attends Archivist Meetings: Not on file  . Marital Status: Not on file     Family History: The patient's family history includes  Alcohol abuse in her brother; Asthma in her sister; Breast cancer in her sister; Eczema in her mother; Food Allergy in her sister; Heart attack (age of onset: 94) in her father; Ovarian cancer in her mother; Pancreatic cancer in her brother; Prostate cancer in her brother and father; Uterine cancer in her mother. There is no history of Diabetes, Stroke, Allergic rhinitis, or Urticaria.  ROS:   Please see the history of present illness.     All other systems reviewed and are negative.  EKGs/Labs/Other Studies Reviewed:    The following studies were reviewed today:  Echo 11/19/2016 LV EF: 50% -   55% Study Conclusions  - Left ventricle: The cavity size was normal. Systolic function was   normal. The estimated ejection fraction was in the range of 50%   to 55%. Wall motion was normal; there were no regional wall   motion abnormalities. Doppler parameters are consistent with   abnormal left ventricular relaxation (grade 1 diastolic   dysfunction). Doppler parameters are consistent with   indeterminate ventricular filling pressure. - Aortic valve: Transvalvular velocity was within the normal range.   There was no stenosis. There was trivial regurgitation. - Mitral valve: Transvalvular velocity was within the normal range.   There was no evidence for stenosis. There was trivial   regurgitation. - Right ventricle: The cavity size was normal. Wall thickness was   normal. Systolic function was normal. - Atrial septum: No defect or patent foramen ovale was identified   by color flow Doppler. - Tricuspid valve: There was trivial regurgitation. - Pulmonary arteries: Systolic pressure was within the normal   range. PA peak pressure: 23 mm Hg (S).   Myoview 07/14/2017  The left ventricular ejection fraction is normal (55-65%).  Nuclear stress EF: 59%.  There was no ST segment deviation noted during stress.  The study is normal.  This is a low risk study.   Normal pharmacologic nuclear  stress test with no evidence for prior infarct or ischemia   EKG:  EKG is ordered today.  The ekg ordered today demonstrates normal sinus rhythm, no significant ST-T wave changes  Recent Labs: No  results found for requested labs within last 8760 hours.  Recent Lipid Panel    Component Value Date/Time   CHOL 196 11/05/2017 0857   TRIG 131 11/05/2017 0857   HDL 62 11/05/2017 0857   CHOLHDL 3.2 11/05/2017 0857   CHOLHDL 4 05/17/2014 0834   VLDL 26.6 05/17/2014 0834   LDLCALC 108 (H) 11/05/2017 0857   LDLDIRECT 106.8 01/10/2011 1218    Physical Exam:    VS:  BP 112/65   Pulse 77   Temp 97.7 F (36.5 C)   Ht 4\' 10"  (1.473 m)   Wt 191 lb 6.4 oz (86.8 kg)   SpO2 95%   BMI 40.00 kg/m     Wt Readings from Last 3 Encounters:  05/24/19 191 lb 6.4 oz (86.8 kg)  04/28/18 190 lb (86.2 kg)  01/26/18 188 lb (85.3 kg)     GEN:  Well nourished, well developed in no acute distress HEENT: Normal NECK: No JVD; No carotid bruits LYMPHATICS: No lymphadenopathy CARDIAC: RRR, no murmurs, rubs, gallops RESPIRATORY:  Clear to auscultation without rales, wheezing or rhonchi  ABDOMEN: Soft, non-tender, non-distended MUSCULOSKELETAL:  No edema; No deformity  SKIN: Warm and dry NEUROLOGIC:  Alert and oriented x 3 PSYCHIATRIC:  Normal affect   ASSESSMENT:    1. Dyspnea, unspecified type   2. Essential hypertension   3. Hyperlipidemia LDL goal <100   4. Hypothyroidism, unspecified type    PLAN:    In order of problems listed above:  1. Dyspnea: Previous cardiopulmonary stress test suggest patient has normal pulmonary function, her dyspnea is likely related to the activity  2. Hypertension: Blood pressure stable on current therapy  3. Hyperlipidemia: She is overdue for fasting lipid panel and LFT.  She can obtain these lab works prior to the next office visit  4. Hypothyroidism: Managed by primary care provider.   Medication Adjustments/Labs and Tests Ordered: Current medicines  are reviewed at length with the patient today.  Concerns regarding medicines are outlined above.  Orders Placed This Encounter  Procedures  . Lipid panel  . Hepatic function panel  . EKG 12-Lead   Meds ordered this encounter  Medications  . amLODipine (NORVASC) 2.5 MG tablet    Sig: Take 1 tablet (2.5 mg total) by mouth daily.    Dispense:  90 tablet    Refill:  3  . rosuvastatin (CRESTOR) 5 MG tablet    Sig: Take 1 tablet (5 mg total) by mouth at bedtime.    Dispense:  90 tablet    Refill:  3  . nebivolol (BYSTOLIC) 5 MG tablet    Sig: Take 1 tablet (5 mg total) by mouth daily.    Dispense:  90 tablet    Refill:  3    Must keep upcoming appointment for continuation refills  . spironolactone (ALDACTONE) 25 MG tablet    Sig: Take 1 tablet (25 mg total) by mouth daily. NEED OV.    Dispense:  90 tablet    Refill:  3    Patient Instructions  Medication Instructions:   Your physician recommends that you continue on your current medications as directed. Please refer to the Current Medication list given to you today.  *If you need a refill on your cardiac medications before your next appointment, please call your pharmacy*  Lab Work: Your physician recommends that you return for lab work in: 9 months   [facility] ([place])  HEPATIC (LIVER)  TEST  If you have labs (blood work) drawn today and your tests are completely normal, you will receive your results only by: [name] MyChart Message (if you have MyChart) OR . A paper copy in the mail If you have any lab test that is abnormal or we need to change your treatment, we will call you to review the results.  Testing/Procedures:  NONE ordered at this time of appointment   Follow-Up: At [facility], you and your health needs are our priority.  As part of our continuing mission to provide you with exceptional heart care, we have created designated Provider Care Teams.  These Care  Teams include your primary Cardiologist (physician) and Advanced Practice Providers (APPs -  Physician Assistants and Nurse Practitioners) who all work together to provide you with the care you need, when you need it.  Your next appointment:   12 month(s)  The format for your next appointment:   In Person  Provider:   Shelva Majestic, MD  Other Instructions      Signed, Almyra Deforest, [place]  05/26/2019 11:52 PM    [facility]

## 2019-05-24 NOTE — Patient Instructions (Signed)
Medication Instructions:   Your physician recommends that you continue on your current medications as directed. Please refer to the Current Medication list given to you today.  *If you need a refill on your cardiac medications before your next appointment, please call your pharmacy*  Lab Work: Your physician recommends that you return for lab work in: 9 months   [place] ([place])  HEPATIC (LIVER) TEST  If you have labs (blood work) drawn today and your tests are completely normal, you will receive your results only by: [name] MyChart Message (if you have MyChart) OR . A paper copy in the mail If you have any lab test that is abnormal or we need to change your treatment, we will call you to review the results.  Testing/Procedures:  NONE ordered at this time of appointment   Follow-Up: At [facility], you and your health needs are our priority.  As part of our continuing mission to provide you with exceptional heart care, we have created designated Provider Care Teams.  These Care Teams include your primary Cardiologist (physician) and Advanced Practice Providers (APPs -  Physician Assistants and Nurse Practitioners) who all work together to provide you with the care you need, when you need it.  Your next appointment:   12 month(s)  The format for your next appointment:   In Person  Provider:   Shelva Majestic, MD  Other Instructions

## 2019-05-26 ENCOUNTER — Encounter: Payer: Self-pay | Admitting: Physician Assistant

## 2019-06-02 NOTE — Progress Notes (Deleted)
   Mary Roth  [id number] Dept: [phone]  FOLLOW UP NOTE  Patient ID: Mary Roth, female    DOB: 1929/03/12  Age: 86 y.o. MRN: [id number] Date of Office Visit: 06/03/2019  Assessment  Chief Complaint: No chief complaint on file.  HPI Mary Roth is an 86 year old female who presents to the clinic for an in clinic food challenge. She was lase seen by this clinic via televisit on 04/25/2019 for evaluation of asthma, allergic rhintiis, and alpha gal allergy.    Drug Allergies:  Allergies  Allergen Reactions  . Beef (Bovine) Protein Anaphylaxis    History of Alpha Gal : Hoff meat allergy.  . Goat's Rue  [Galega] Anaphylaxis  . Lambs Quarters Anaphylaxis  . Other Anaphylaxis  . Poractant Alfa Anaphylaxis  . Pork Allergy Anaphylaxis  . Pork-Derived Products Anaphylaxis  . Phenergan [Promethazine Hcl] Nausea And Vomiting    Physical Exam: There were no vitals taken for this visit.   Physical Exam  Diagnostics:    Assessment and Plan: No diagnosis found.  No orders of the defined types were placed in this encounter.   There are no Patient Instructions on file for this visit.  No follow-ups on file.    Thank you for the opportunity to care for this patient.  Please do not hesitate to contact me with questions.  Gareth Morgan, FNP [facility]

## 2019-06-03 ENCOUNTER — Encounter: Payer: Medicare Other | Admitting: Family Medicine

## 2019-06-15 ENCOUNTER — Encounter: Payer: Self-pay | Admitting: Family Medicine

## 2019-06-15 ENCOUNTER — Telehealth: Payer: Self-pay | Admitting: Cardiovascular Disease

## 2019-06-15 ENCOUNTER — Other Ambulatory Visit: Payer: Self-pay

## 2019-06-15 ENCOUNTER — Encounter: Payer: Medicare Other | Admitting: Family Medicine

## 2019-06-15 ENCOUNTER — Ambulatory Visit: Payer: Medicare Other | Admitting: Family Medicine

## 2019-06-15 VITALS — HR 76 | Resp 26

## 2019-06-15 NOTE — Progress Notes (Addendum)
[address] [place] [address] Dept: [phone]  FOLLOW UP NOTE  Patient ID: Mary Roth, female    DOB: 07-31-1928  Age: 86 y.o. MRN: [id number] Date of Office Visit: 06/15/2019  Assessment  Chief Complaint: Food/Drug Challenge  HPI [name] is an 86 year old female who presents to the clinic for a food challenge. She has successfully avoided mammalian products for the last 3 years. She has recently had lab work indicating that her alhpa gal levels have significantly declined and she is interested in a food challenge today. She reports some shortness of breath, however, denies wheeze and cough. She continues Breo Ellipta 100-1 puff most days. She reports that she has had a steady increase in dyspnea which is a little worse with activity. She has recently been seen by cardiology for an evaluation of dyspnea. Part of her treatment plan includes Bistolic. After waiting for 15 minutes, the patient's respiratory rate dropped from 28 to 26. Upon further review of patient's history and the patient's physical exam, it was decided that it was not safe to move forward with the food challenge in the clinic today. Her current medications are listed in the chart.    Drug Allergies:  Allergies  Allergen Reactions  . Beef (Bovine) Protein Anaphylaxis    History of Alpha Gal : Hoff meat allergy.  . Goat's Rue  [Galega] Anaphylaxis  . Lambs Quarters Anaphylaxis  . Other Anaphylaxis  . Poractant Alfa Anaphylaxis  . Pork Allergy Anaphylaxis  . Pork-Derived Products Anaphylaxis  . Cephalexin Other (See Comments)    Nausea, stomach upset, diarrhea  . Phenergan [Promethazine Hcl] Nausea And Vomiting    Physical Exam: Pulse 76   Resp (!) 26   SpO2 95%    Physical Exam Constitutional:      Appearance: Normal appearance.  HENT:     Head: Normocephalic and atraumatic.     Mouth/Throat:     Pharynx: Oropharynx is clear.  Eyes:     Conjunctiva/sclera: Conjunctivae normal.    Cardiovascular:     Rate and Rhythm: Normal rate and regular rhythm.     Heart sounds: No murmur.  Pulmonary:     Comments: Patient dyspneic. Lungs clear to auscultation. Spirometry within normal limits. Skin:    General: Skin is warm and dry.  Neurological:     Mental Status: She is alert and oriented to person, place, and time.  Psychiatric:        Mood and Affect: Mood normal.        Behavior: Behavior normal.        Thought Content: Thought content normal.        Judgment: Judgment normal.     Diagnostics: FVC 1.89, FEV1 1.47. Predicted FVC 1.53, predicted FEV1 1.10. Spirometry indicates normal ventilatory function.   Assessment and Plan: 1. Anaphylactic shock due to food, subsequent encounter   2. Moderate persistent asthma without complication   3. Perennial and seasonal allergic rhinitis      Patient Instructions   Tachypnea Tachypnea while at rest.  Respiratory rate was assessed several times during her visit.  Spirometry today revealed normal ventilatory function and pulmonary examination was clear to auscultation with good air movement and without wheezing, rhonchi or crackles.  The patient has been asked to contact her primary care physician and/or cardiologist for further evaluation regarding this issue.  Asthma Continue Breo Ellipta 100- 1 puff once a day to prevent cough or wheeze Continue montelukast 10 mg  once a dat to prevent cough or wheeze ProAir 2 puffs as needed for cough or wheeze.  You may use ProAir 2 puffs 5-15 minutes before exercise to prevent cough or wheeze  Allergic rhinitis Continue Flonase 1-2 sprays in each nostril once a day as needed for a stuffy nose  Alpha gal allergy Continue to avoid beef, lamb, pork, . In case of an allergic reaction, give Benadryl 50 mg every 4 hours, and if life-threatening symptoms occur, inject with EpiPen 0.3 mg. Return to the clinic at a time when your breathing is better for the food challenge.   Continue  the medications as listed in your chart  Call us if this treatment plan is not working well for you  Follow up in 5 months or sooner if needed    Return in about 5 months (around 11/12/2019), or if symptoms worsen or fail to improve.    Thank you for the opportunity to care for this patient.  Please do not hesitate to contact me with questions.  Gareth Morgan, FNP [facility] [place]   ________________________________________________  I have provided oversight concerning [facility]'s evaluation and treatment of this patient's health issues addressed during today's encounter.  I agree with the assessment and therapeutic plan as outlined in the note.   Signed,   R Edgar Frisk, MD

## 2019-06-15 NOTE — Telephone Encounter (Signed)
Returned call to patient's husband no answer.[place].

## 2019-06-15 NOTE — Progress Notes (Signed)
This encounter was created in error - please disregard.

## 2019-06-15 NOTE — Telephone Encounter (Signed)
Follow up  Attempted to contact triage nurse for patient being sob not available at this time. Sending [facility]. Please call asap.

## 2019-06-15 NOTE — Telephone Encounter (Signed)
Returned call to patient's husband.He stated wife had appointment with allergy Dr.today.He was not able to do any testing due to wife being sob.He said her lungs were clear and wanted her to see Dr.Kelly.Stated wife is resting at present,but when she gets up and moves around she is sob.Appointment scheduled with Dr.Kelly 06/16/19 at 3:20 pm.

## 2019-06-15 NOTE — Telephone Encounter (Signed)
New message       Pt c/o Shortness Of Breath: STAT if SOB developed within the last 24 hours or pt is noticeably SOB on the phone  1. Are you currently SOB (can you hear that pt is SOB on the phone)? Yes   2. How long have you been experiencing SOB?per patient's husband since earlier this morning  3. Are you SOB when sitting or when up moving around?both  4. Are you currently experiencing any other symptoms? No

## 2019-06-15 NOTE — Patient Instructions (Signed)
Asthma Continue Breo Ellipta 100- 1 puff once a day to prevent cough or wheeze Continue montelukast 10 mg once a dat to prevent cough or wheeze ProAir 2 puffs as needed for cough or wheeze.  You may use ProAir 2 puffs 5-15 minutes before exercise to prevent cough or wheeze  Allergic rhinitis Continue Flonase 1-2 sprays in each nostril once a day as needed for a stuffy nose  Alpha gal allergy Continue to avoid beef, lamb, pork, . In case of an allergic reaction, give Benadryl 50 mg every 4 hours, and if life-threatening symptoms occur, inject with EpiPen 0.3 mg. Return to the clinic at a time when your breathing is better for the food challenge.   Continue the medications as listed in your chart  Call us if this treatment plan is not working well for you  Follow up in 5 months or sooner if needed

## 2019-06-16 ENCOUNTER — Ambulatory Visit: Payer: Medicare Other | Admitting: Cardiovascular Disease

## 2019-06-16 ENCOUNTER — Encounter: Payer: Self-pay | Admitting: Cardiovascular Disease

## 2019-06-16 ENCOUNTER — Encounter: Payer: Self-pay | Admitting: Family Medicine

## 2019-06-16 VITALS — BP 126/76 | HR 68 | Ht <= 58 in | Wt 190.6 lb

## 2019-06-16 DIAGNOSIS — R0600 Dyspnea, unspecified: Secondary | ICD-10-CM | POA: Diagnosis not present

## 2019-06-16 DIAGNOSIS — J4520 Mild intermittent asthma, uncomplicated: Secondary | ICD-10-CM

## 2019-06-16 DIAGNOSIS — I10 Essential (primary) hypertension: Secondary | ICD-10-CM

## 2019-06-16 DIAGNOSIS — R0789 Other chest pain: Secondary | ICD-10-CM | POA: Diagnosis not present

## 2019-06-16 DIAGNOSIS — I5032 Chronic diastolic (congestive) heart failure: Secondary | ICD-10-CM

## 2019-06-16 DIAGNOSIS — E039 Hypothyroidism, unspecified: Secondary | ICD-10-CM

## 2019-06-16 DIAGNOSIS — R0682 Tachypnea, not elsewhere classified: Secondary | ICD-10-CM | POA: Insufficient documentation

## 2019-06-16 DIAGNOSIS — E7800 Pure hypercholesterolemia, unspecified: Secondary | ICD-10-CM

## 2019-06-16 DIAGNOSIS — R0609 Other forms of dyspnea: Secondary | ICD-10-CM

## 2019-06-16 MED ORDER — NEBIVOLOL HCL 2.5 MG PO TABS: 2.5000 mg | ORAL_TABLET | Freq: Every day | ORAL | 3 refills | Status: DC

## 2019-06-16 MED ORDER — AMLODIPINE BESYLATE 5 MG PO TABS: 5.0000 mg | ORAL_TABLET | Freq: Every day | ORAL | 3 refills | Status: DC

## 2019-06-16 NOTE — Addendum Note (Signed)
Addended by: Orpah Greek D on: 06/16/2019 11:09 AM   Modules accepted: Orders

## 2019-06-16 NOTE — Assessment & Plan Note (Signed)
Tachypnea while at rest.  Respiratory rate was assessed several times during her visit.  Spirometry today revealed normal ventilatory function and pulmonary examination was clear to auscultation with good air movement and without wheezing, rhonchi or crackles.  The patient has been asked to contact her cardiologist for further evaluation.

## 2019-06-16 NOTE — Patient Instructions (Signed)
Medication Instructions:  INCREASE AMLODIPINE TO 5MG  DAILY (1 TABLET) DECREASE BYSTOLIC TO 2.5MG  DAILY (1 TABLET) *If you need a refill on your cardiac medications before your next appointment, please call your pharmacy*  Lab Work: FASTING LIPID, CMET, TSH, CBC If you have labs (blood work) drawn today and your tests are completely normal, you will receive your results only by: [name] MyChart Message (if you have MyChart) OR . A paper copy in the mail If you have any lab test that is abnormal or we need to change your treatment, we will call you to review the results.  Testing/Procedures: Your physician has requested that you have an echocardiogram. Echocardiography is a painless test that uses sound waves to create images of your heart. It provides your doctor with information about the size and shape of your heart and how well your heart's chambers and valves are working. This procedure takes approximately one hour. There are no restrictions for this procedure.  [place] has requested that you have a lexiscan myoview. For further information please visit [email or web address]. Please follow instruction sheet, as given. DONE AT DR.KELLY'S OFFICE  Follow-Up: At [facility], you and your health needs are our priority.  As part of our continuing mission to provide you with exceptional heart care, we have created designated Provider Care Teams.  These Care Teams include your primary Cardiologist (physician) and Advanced Practice Providers (APPs -  Physician Assistants and Nurse Practitioners) who all work together to provide you with the care you need, when you need it.  Your next appointment:   6 week(s)  The format for your next appointment:   In Person  Provider:   Shelva Majestic, MD

## 2019-06-16 NOTE — Progress Notes (Signed)
Patient ID: Mary Roth, female   DOB: 06/20/1928, 86 y.o.   MRN: [id number]    HPI:  [name] is an 86 year old female who presents for a 27 month cardiology followup evaluation.   [name] has a long history of asthma and previously was under the care  Dr. Ignacia Palma. She takes  Advair PRN as well as albuterol  and also takes Flonase for rhinitis. She is a former patient of Dr. Melvern Banker. An echo Doppler study in 2012 showed concentric left ventricular hypertrophy with normal systolic function and grade 1 diastolic dysfunction. She had mild mitral annular calcification and evidence for aortic valve sclerosis Over the past several months, she had noticed increasing episodes of shortness of breath and also intermittent episodes of chest discomfort. She has noticed recent episodes of shortness of breath with walking. Approximately one month ago [place] experienced chest pressure associated with significant shortness of breath. She has noticed left arm radiation and also took a nitroglycerin which did seem to improve symptoms but this took approximately 20 minutes.   Additional problems include hyperlipidemia, hypothyroidism, and intermittent leg swelling. She was told of having rheumatic fever while in 6 grade.  She underwent an echo Doppler study on 06/20/2013 which showed normal systolic function with an ejection fraction of 55-60% but with grade 1 diastolic dysfunction. She did not have significant valvular pathology and her chamber dimensions were normal. However, she did have left ventricular hypertrophy. A nuclear perfusion study was done on 06/23/2013 and this was felt to be normal without evidence for scar or ischemia. Post stress ejection fraction was 70%.  [name] continued to experience significant shortness of breath with activity and at times she feels this has progressed to the point where she is unable to breathe. Her echo Doppler study showed  normal right ventricular pressure at 11 mm and her peak RV to RA gradient was 8 mm. She denies any calf tenderness, there is no PND or orthopnea or palpitations.    I empirically added low-dose amlodipine 2.5 mg as well as Bystolic 2.5 mg to her medical regimen. Due to her continued shortness of breath with activity I recommended a cardiopulmonary met test to evaluate for potential microvascular angina and endothelial dysfunction. This was done 04/03/2014. She had normal functional status and achieved a peak maximum oxygen consumption 92% of predicted. Her cardiovascular response to exercise was felt to be indeterminate due to suboptimal peak cardiovascular stress load. She had a blunted chronotropic response to exercise. Pulmonary response was normal. Exercise was limited by effort. PFT summary revealed spirometry, lung volumes, and diffusion were within normal limits. Her anaerobic threshold was low at 9.4.  When I  saw her in 2016 she was remaining stable.  However she was under increased stress since her husband was undergoing chemotherapy for lung cancer.  She has seen numerous extenders in our office and last saw Mary Roth on Oct 08, 2017.  In March 2019 she had undergone a nuclear perfusion study for some vague chest pain which was normal.  Retrospect she believes the pain may have been GI in etiology.  She is felt to have chronic diastolic heart failure and had some issues with lower extremity edema and Lasix was reinstituted.  I saw her in June 2019 after  not having seen her in almost 3 years.  She was living in the  [place] [facility].  Dr. Linna Darner had retired.  She had  noticed some intermittent leg swelling and was taking spironolactone in addition to furosemide, low-dose  amlodipine and Bystolic daily.   At her last evaluation with me in December 2019 she denied  any episodes of chest pain or shortness of breath.  She has rarely been taking Lasix but has continued to take  Spironolactone 25 mg daily, Bystolic 5 mg and amlodipine 2.5 mg daily.  She continued to take rosuvastatin 5 mg for hyperlipidemia.  She was on levothyroxine 88 mcg for hypothyroidism.  During that evaluation, she had issues with ankle edema and was not taking furosemide.  I again suggested she try taking furosemide 20 mg on days if there was leg swelling.  She saw Mary Roth, [place] in January 2021.  She lives at [facility].  She has been staying in due to the COVID-19 pandemic.  At the time she did not have edema.    She was to undergo a food challenge to red meat in light of her alpha gal reported allergy.  However this was not done.  Recently, she has noticed some increasing exertional shortness of breath with decreasing activity.  Retrospectively the may have been ongoing for approximately 6 months.  She also states that she may be experiencing some mild chest tightness when she gets short of breath.  She has not had recent laboratory.  She presents for evaluation.  Past Medical History:  Diagnosis Date  . Abnormal liver function test   . Alpha galactosidase deficiency   . Arthritis   . Asthma   . Chest pain    a. 06/2013 Lexi MV: no ischemia/infarct;  b. 12/2015 MV ([place]): EF 82%, no ischemia.  . DDD (degenerative disc disease), lumbar   . DDD (degenerative disc disease), lumbosacral   . Esophageal reflux   . Hepatic steatosis   . Mastodynia   . Murmur   . Osteopenia 03/2012   T score -1.4 FRAX 20%/11%; Dr Phineas Real  . Other and unspecified hyperlipidemia   . Personal history of colonic polyps 08/21/2006   ADENOMATOUS POLYP  . Progressive Dyspnea    a. 06/2013 Echo: EF 55-60%, Gr1 DD;  b. 03/2014 CPX testing: indeterminate cv response due to suboptimal cv stress load and blunted chronotropic response. Nl pulm response, nl PFTs;  c. 12/2015 Echo [facility]): EF 55-60%, no significant valve dzs.  [name] Spinal stenosis   . Unspecified asthma(493.90) 2003    no  childhood asthma  . Unspecified essential hypertension   . Unspecified hypothyroidism     Past Surgical History:  Procedure Laterality Date  . APPENDECTOMY    . CHOLECYSTECTOMY  1973    for stones  . TONSILLECTOMY    . UTERINE SUSPENSION  1968   [place] , [place]    Allergies  Allergen Reactions  . Beef (Bovine) Protein Anaphylaxis    History of Alpha Gal : Hoff meat allergy.  . Goat's Rue  [Galega] Anaphylaxis  . Lambs Quarters Anaphylaxis  . Other Anaphylaxis  . Poractant Alfa Anaphylaxis  . Pork Allergy Anaphylaxis  . Pork-Derived Products Anaphylaxis  . Cephalexin Other (See Comments)    Nausea, stomach upset, diarrhea  . Phenergan [Promethazine Hcl] Nausea And Vomiting    Current Outpatient Medications  Medication Sig Dispense Refill  . albuterol (PROAIR HFA) 108 (90 Base) MCG/ACT inhaler Inhale 2 puffs into the lungs every 4 (four) hours as needed for wheezing or shortness of breath. 18 g 1  . albuterol (PROVENTIL) (2.5 MG/3ML) 0.083% nebulizer  solution One ampule in nebulizer every 4 hours if needed for cough or wheeze. 75 mL 1  . amLODipine (NORVASC) 5 MG tablet Take 1 tablet (5 mg total) by mouth daily. 90 tablet 3  . cetirizine (ZYRTEC ALLERGY) 10 MG tablet Take 10 mg by mouth as needed.     [name] EPINEPHrine 0.3 mg/0.3 mL IJ SOAJ injection Use as directed for severe allergic reaction. 2 Device 2  . esomeprazole (NEXIUM) 40 MG capsule Take by mouth.    . fluticasone (FLONASE) 50 MCG/ACT nasal spray Place 1 spray into both nostrils daily as needed. 16 g 5  . fluticasone furoate-vilanterol (BREO ELLIPTA) 100-25 MCG/INH AEPB INHALE 1 PUFF INTO THE LUNGS BY MOUTH ONCE A DAY (Patient taking differently: Inhale 1 puff into the lungs as needed. INHALE 1 PUFF INTO THE LUNGS BY MOUTH ONCE A DAY) 1 each 5  . Fluticasone-Salmeterol (ADVAIR) 250-50 MCG/DOSE AEPB Inhale 1 puff into the lungs daily as needed (if asthma flare).     . furosemide (LASIX) 20 MG tablet Take 1 tablet (20 mg  total) by mouth as needed for edema. 30 tablet 3  . levothyroxine (SYNTHROID, LEVOTHROID) 88 MCG tablet Take by mouth.    . montelukast (SINGULAIR) 10 MG tablet Take 1 tablet (10 mg total) by mouth at bedtime. 30 tablet 5  . nebivolol (BYSTOLIC) 2.5 MG tablet Take 1 tablet (2.5 mg total) by mouth daily. 90 tablet 3  . rOPINIRole (REQUIP) 0.25 MG tablet Take 1 tablet (0.25 mg total) by mouth at bedtime. 1 pill 2 hrs pre bedtime (Patient taking differently: Take 0.25 mg by mouth as needed. 1 pill 2 hrs pre bedtime) 30 tablet 2  . rosuvastatin (CRESTOR) 5 MG tablet Take 1 tablet (5 mg total) by mouth at bedtime. (Patient taking differently: Take 5 mg by mouth as needed. ) 90 tablet 3  . spironolactone (ALDACTONE) 25 MG tablet Take 1 tablet (25 mg total) by mouth daily. NEED OV. 90 tablet 3  . nitroGLYCERIN (NITROSTAT) 0.4 MG SL tablet Place 1 tablet (0.4 mg total) under the tongue every 15 (fifteen) minutes as needed for chest pain. 90 tablet 3   No current facility-administered medications for this visit.    Socially she is married has 2 children one grandchild. There is no tobacco history. She does drink alcohol occasionally.  Family History  Problem Relation Age of Onset  . Uterine cancer Mother   . Ovarian cancer Mother   . Eczema Mother   . Heart attack Father 83  . Prostate cancer Father   . Breast cancer Sister        x 3 sisters  . Asthma Sister   . Food Allergy Sister   . Alcohol abuse Brother        x 3  . Prostate cancer Brother        x 3 brothers  . Pancreatic cancer Brother        x 2  . Diabetes Neg Hx   . Stroke Neg Hx   . Allergic rhinitis Neg Hx   . Urticaria Neg Hx    ROS General: Negative; No fevers, chills, or night sweats;  HEENT: Negative; No changes in vision or hearing, sinus congestion, difficulty swallowing Pulmonary: Positive for asthma Cardiovascular: Negative; No chest pain, presyncope, syncope, palpitations GI: Positive for GERD GU: Negative; No  dysuria, hematuria, or difficulty voiding Musculoskeletal: Negative; no myalgias, joint pain, or weakness Hematologic/Oncology: Negative; no easy bruising, bleeding Endocrine: Positive for hypothyroidism  Neuro: Positive for peripheral neuropathy Skin: Negative; No rashes or skin lesions Psychiatric: Negative; No behavioral problems, depression Sleep: Negative; No snoring, daytime sleepiness, hypersomnolence, bruxism, restless legs, hypnogognic hallucinations, no cataplexy   PE BP 126/76   Pulse 68   Ht _0  (1.473 m)   Wt 190 lb 9.6 oz (86.5 kg)   BMI 39.84 kg/m    Repeat blood pressure by me was 150/78  Wt Readings from Last 3 Encounters:  06/16/19 190 lb 9.6 oz (86.5 kg)  05/24/19 191 lb 6.4 oz (86.8 kg)  04/28/18 190 lb (86.2 kg)   General: Alert, oriented, no distress.  Skin: normal turgor, no rashes, warm and dry HEENT: Normocephalic, atraumatic. Pupils equal round and reactive to light; sclera anicteric; extraocular muscles intact;  Nose without nasal septal hypertrophy Mouth/Parynx benign; Mallinpatti scale 2 Neck: No JVD, no carotid bruits; normal carotid upstroke Lungs: clear to ausculatation and percussion; no wheezing or rales Chest wall: without tenderness to palpitation Heart: PMI not displaced, RRR, s1 s2 normal, 1/6 systolic murmur, no diastolic murmur, no rubs, gallops, thrills, or heaves Abdomen: soft, nontender; no hepatosplenomehaly, BS+; abdominal aorta nontender and not dilated by palpation. Back: no CVA tenderness Pulses 2+ Musculoskeletal: full range of motion, normal strength, no joint deformities Extremities: no clubbing cyanosis or edema, Homan's sign negative  Neurologic: grossly nonfocal; Cranial nerves grossly wnl Psychologic: Normal mood and affect   ECG (independently read by me): Sinus rhythm at 68 bpm.  Mild first-degree block. No ectopy  December 2019 ECG (independently read by me): Normal sinus rhythm at 71 bpm.  Borderline  first-degree block with appeared over 204 ms.  Nonspecific ST changes.  Ectopy.  June 2019 ECG (independently read by me): Sinus rhythm with first-degree AV block.  Heart rate 64 bpm.  PR interval 212 ms.  October 2016 ECG (independently read by me): Normal  inus rhythm at 80 bpm with one isolated PVC.  QTc interval 435 ms.  PR duration 192 ms  March 2015 Last ECG (independently read by me): Normal sinus rhythm at 64 beats per minute. Normal intervals. No evidence for either an S wave in I or Q in 3.  Prior ECG of 06/08/2013 (independently read by me): Sinus rhythm at 78 beats per minute. No significant ST changes. Normal intervals.  LABS:. BMP Latest Ref Rng & Units 11/05/2017 11/07/2016 09/25/2016  Glucose 65 - 99 mg/dL 91 85 145(H)  BUN 8 - 27 mg/dL 18 16 24(H)  Creatinine 0.57 - 1.00 mg/dL 0.76 0.94 1.03(H)  BUN/Creat Ratio 12 - _1 -  Sodium 134 - 144 mmol/L 144 143 139  Potassium 3.5 - 5.2 mmol/L 5.2 5.5(H) 4.8  Chloride 96 - 106 mmol/L 105 101 102  CO2 20 - 29 mmol/L _2 Calcium 8.7 - 10.3 mg/dL 9.6 10.0 9.2   Hepatic Function Latest Ref Rng & Units 11/05/2017 09/25/2016 12/07/2015  Total Protein 6.0 - 8.5 g/dL 6.3 6.7 6.1  Albumin 3.5 - 4.7 g/dL 4.0 3.5 3.8  AST 0 - 40 IU/L 25 42(H) 20  ALT 0 - 32 IU/L _3 Alk Phosphatase 39 - 117 IU/L 81 90 87  Total Bilirubin 0.0 - 1.2 mg/dL 0.3 0.5 0.4  Bilirubin, Direct 0.0 - 0.3 mg/dL - - -   CBC Latest Ref Rng & Units 11/05/2017 07/08/2017 09/25/2016  WBC 3.4 - 10.8 x10E3/uL 7.0 8.9 14.8(H)  Hemoglobin 11.1 - 15.9 g/dL 13.5 14.0 13.2  Hematocrit 34.0 - 46.6 %  40.7 40.5 39.3  Platelets 150 - 450 x10E3/uL 246 248 196   Lab Results  Component Value Date   MCV 94 11/05/2017   MCV 92 07/08/2017   MCV 95.9 09/25/2016   Lab Results  Component Value Date   TSH 0.633 11/05/2017   Lab Results  Component Value Date   HGBA1C 6.1 04/27/2012   Lipid Panel     Component Value Date/Time   CHOL 196 11/05/2017 0857   TRIG  131 11/05/2017 0857   HDL 62 11/05/2017 0857   CHOLHDL 3.2 11/05/2017 0857   CHOLHDL 4 05/17/2014 0834   VLDL 26.6 05/17/2014 0834   LDLCALC 108 (H) 11/05/2017 0857   LDLDIRECT 106.8 01/10/2011 1218      RADIOLOGY: No results found.  IMPRESSION: 1. Dyspnea on exertion   2. Chest tightness   3. Essential hypertension   4. Chronic diastolic CHF (congestive heart failure), NYHA class 2 ([place])   5. Mild intermittent asthma without complication   6. Hypercholesterolemia   7. Hypothyroidism, unspecified type     ASSESSMENT AND PLAN: Ms. Marchant is an 86 year-old female who has a long-standing history of asthma and in the past developed increasing shortness of breath. She has noticed several episodes of significant chest pressure and on one instance associated with left arm radiation. When I initially saw her I added low-dose amlodipine at 2.5 mg in light of this chest pressure to induced vasodilation and also added a very low dose cardioselective beta-blockade with bystolic 2.5 mg and suggested that she initiate a baby aspirin 81 mg.  Prior testing demonstrated a normal myocardial perfusion scan and echo Doppler study did show normal systolic function with grade 1 diastolic dysfunction. A cardiopulmonary met test showed fairly normal maximum oxygen consumption. However, the test was somewhat indeterminate due to to her inability to achieve a maximum heart rate. She was only to able to achieve a heart rate of 103 which was submaximal. Her heart rate response was somewhat blunted. I suspected much of her shortness of breath most likely contributed by reduced aerobic capacity.  Pulmonary function studies were normal.  Today she admits that perhaps over the past 6 months he has noticed some increasing exertional dyspnea with less activity.  There has been some vague sensation of chest tightness and pressure.  She denies any recent wheezing exacerbation.  Her blood pressure initially was good but on repeat  by me was elevated.  With her blood pressure lability I have recommended further titration of amlodipine to 5 mg.  I will slightly decrease Bystolic to 2.5 mg since when she had her allergy assessment the issue was raised about potential beta-blocker reducing the effect of an epi- pen if she ever required use.  With her increasing exertional dyspnea, I am recommending a 2D echo Doppler study as well as a follow-up Lexiscan Myoview study to make certain there has not been any significant change from prior evaluations.  I am also checking complete set of fasting laboratory.  She continues to be on rosuvastatin for hyperlipidemia.  She is on levothyroxine for hypothyroidism.  I will see her in 6 weeks for reevaluation or sooner as necessary.   Troy Sine, MD, [facility] 06/18/2019 11:08 AM

## 2019-06-18 ENCOUNTER — Encounter: Payer: Self-pay | Admitting: Cardiovascular Disease

## 2019-06-21 ENCOUNTER — Telehealth (HOSPITAL_COMMUNITY): Payer: Self-pay

## 2019-06-21 NOTE — Telephone Encounter (Signed)
Encounter complete. 

## 2019-06-23 ENCOUNTER — Ambulatory Visit (HOSPITAL_COMMUNITY)
Admission: RE | Admit: 2019-06-23 | Discharge: 2019-06-23 | Disposition: A | Discharge status: Home / Self Care | Payer: Medicare Other | Source: Ambulatory Visit | Attending: Cardiovascular Disease | Admitting: Cardiovascular Disease

## 2019-06-23 ENCOUNTER — Other Ambulatory Visit: Payer: Self-pay

## 2019-06-23 DIAGNOSIS — R0600 Dyspnea, unspecified: Secondary | ICD-10-CM | POA: Diagnosis not present

## 2019-06-23 DIAGNOSIS — E7800 Pure hypercholesterolemia, unspecified: Secondary | ICD-10-CM | POA: Diagnosis not present

## 2019-06-23 DIAGNOSIS — R0609 Other forms of dyspnea: Secondary | ICD-10-CM

## 2019-06-23 DIAGNOSIS — J4520 Mild intermittent asthma, uncomplicated: Secondary | ICD-10-CM

## 2019-06-23 DIAGNOSIS — R0789 Other chest pain: Secondary | ICD-10-CM | POA: Diagnosis not present

## 2019-06-23 DIAGNOSIS — I10 Essential (primary) hypertension: Secondary | ICD-10-CM

## 2019-06-23 DIAGNOSIS — I5032 Chronic diastolic (congestive) heart failure: Secondary | ICD-10-CM

## 2019-06-23 LAB — LIPID PANEL
Chol/HDL Ratio: 2.6 ratio (ref 0.0–4.4)
Cholesterol, Total: 183 mg/dL (ref 100–199)
HDL: 71 mg/dL (ref >39)
LDL Chol Calc (NIH): 88 mg/dL (ref 0–99)
Triglycerides: 140 mg/dL (ref 0–149)
VLDL Cholesterol Cal: 24 mg/dL (ref 5–40)

## 2019-06-23 LAB — COMPREHENSIVE METABOLIC PANEL
ALT: 10 IU/L (ref 0–32)
AST: 18 IU/L (ref 0–40)
Albumin/Globulin Ratio: 1.7 (ref 1.2–2.2)
Albumin: 4.1 g/dL (ref 3.6–4.6)
Alkaline Phosphatase: 80 IU/L (ref 39–117)
BUN/Creatinine Ratio: 16 (ref 12–28)
BUN: 14 mg/dL (ref 8–27)
Bilirubin Total: 0.4 mg/dL (ref 0.0–1.2)
CO2: 22 mmol/L (ref 20–29)
Calcium: 9.3 mg/dL (ref 8.7–10.3)
Chloride: 104 mmol/L (ref 96–106)
Creatinine, Ser: 0.89 mg/dL (ref 0.57–1.00)
GFR calc Af Amer: 67 mL/min/{1.73_m2} (ref >59)
GFR calc non Af Amer: 58 mL/min/{1.73_m2} — ABNORMAL LOW (ref >59)
Globulin, Total: 2.4 g/dL (ref 1.5–4.5)
Glucose: 95 mg/dL (ref 65–99)
Potassium: 4.5 mmol/L (ref 3.5–5.2)
Sodium: 143 mmol/L (ref 134–144)
Total Protein: 6.5 g/dL (ref 6.0–8.5)

## 2019-06-23 LAB — CBC
Hematocrit: 40.1 % (ref 34.0–46.6)
Hemoglobin: 13.8 g/dL (ref 11.1–15.9)
MCH: 31.9 pg (ref 26.6–33.0)
MCHC: 34.4 g/dL (ref 31.5–35.7)
MCV: 93 fL (ref 79–97)
Platelets: 211 10*3/uL (ref 150–450)
RBC: 4.33 x10E6/uL (ref 3.77–5.28)
RDW: 12.4 % (ref 11.7–15.4)
WBC: 7.7 10*3/uL (ref 3.4–10.8)

## 2019-06-23 LAB — MYOCARDIAL PERFUSION IMAGING
LV dias vol: 90 mL (ref 46–106)
LV sys vol: 43 mL
Peak HR: 88 {beats}/min
Rest HR: 60 {beats}/min
SDS: 4
SRS: 8
SSS: 12
TID: 1

## 2019-06-23 LAB — TSH: TSH: 1.21 u[IU]/mL (ref 0.450–4.500)

## 2019-06-23 MED ORDER — REGADENOSON 0.4 MG/5ML IV SOLN
0.4000 mg | Freq: Once | INTRAVENOUS | Status: AC
  Administered 2019-06-23: 0.4 mg via INTRAVENOUS

## 2019-06-23 MED ORDER — TECHNETIUM TC 99M TETROFOSMIN IV KIT
32.1000 | PACK | Freq: Once | INTRAVENOUS | Status: AC | PRN
  Administered 2019-06-23: 32.1 via INTRAVENOUS
  Filled 2019-06-23: qty 33

## 2019-06-23 MED ORDER — TECHNETIUM TC 99M TETROFOSMIN IV KIT
10.8000 | PACK | Freq: Once | INTRAVENOUS | Status: AC | PRN
  Administered 2019-06-23: 10.8 via INTRAVENOUS
  Filled 2019-06-23: qty 11

## 2019-06-29 ENCOUNTER — Other Ambulatory Visit: Payer: Self-pay

## 2019-06-29 ENCOUNTER — Ambulatory Visit (HOSPITAL_COMMUNITY): Payer: Medicare Other | Attending: Cardiology

## 2019-06-29 DIAGNOSIS — I5032 Chronic diastolic (congestive) heart failure: Secondary | ICD-10-CM

## 2019-06-29 DIAGNOSIS — R0789 Other chest pain: Secondary | ICD-10-CM | POA: Diagnosis not present

## 2019-06-29 DIAGNOSIS — I10 Essential (primary) hypertension: Secondary | ICD-10-CM | POA: Diagnosis not present

## 2019-06-29 DIAGNOSIS — R0600 Dyspnea, unspecified: Secondary | ICD-10-CM | POA: Insufficient documentation

## 2019-06-29 DIAGNOSIS — R0609 Other forms of dyspnea: Secondary | ICD-10-CM

## 2019-06-29 DIAGNOSIS — J4520 Mild intermittent asthma, uncomplicated: Secondary | ICD-10-CM | POA: Diagnosis not present

## 2019-06-29 DIAGNOSIS — E7800 Pure hypercholesterolemia, unspecified: Secondary | ICD-10-CM | POA: Diagnosis not present

## 2019-07-04 DIAGNOSIS — Z85828 Personal history of other malignant neoplasm of skin: Secondary | ICD-10-CM | POA: Diagnosis not present

## 2019-07-04 DIAGNOSIS — L821 Other seborrheic keratosis: Secondary | ICD-10-CM | POA: Diagnosis not present

## 2019-07-04 DIAGNOSIS — L814 Other melanin hyperpigmentation: Secondary | ICD-10-CM | POA: Diagnosis not present

## 2019-07-04 DIAGNOSIS — Q809 Congenital ichthyosis, unspecified: Secondary | ICD-10-CM | POA: Diagnosis not present

## 2019-07-04 DIAGNOSIS — D1801 Hemangioma of skin and subcutaneous tissue: Secondary | ICD-10-CM | POA: Diagnosis not present

## 2019-07-20 ENCOUNTER — Telehealth: Payer: Self-pay | Admitting: Cardiovascular Disease

## 2019-07-20 NOTE — Telephone Encounter (Signed)
Per pt for the last 2 days has noted slight h/a (pulsating pain ) that comes and goes and lasts for about a minute pt has had several over the last 2 days  B/P 114/64 HR 60 No other symptoms Will forward to Dr Claiborne Billings for review and recommendations .cy

## 2019-07-20 NOTE — Telephone Encounter (Signed)
Patient's states she has been experiencing a severe headache on the left side of her head. The headache has been coming and going for 2 days, however she states she has not experienced any other symptoms. Please advise.

## 2019-07-21 DIAGNOSIS — G500 Trigeminal neuralgia: Secondary | ICD-10-CM | POA: Diagnosis not present

## 2019-07-21 NOTE — Telephone Encounter (Signed)
Patient following up. She would like to speak with someone who can give her answers. She is aware that Dr. Claiborne Billings is not in the office this week but would be willing to speak with an APP if possible.

## 2019-07-21 NOTE — Telephone Encounter (Signed)
I spoke with patient.  She reports she has pulses of brief pain from her left eye to her ear.  Happens several times during the day.  No headache. BP OK.  No sinus issues.  I told her she could take tylenol to see if this would help. She would like to know cause of this pain.  I asked her to contact her PCP to see about scheduling an office visit.  She is agreeable with this plan

## 2019-07-22 NOTE — Telephone Encounter (Signed)
Agree with recommendations.  

## 2019-07-28 ENCOUNTER — Ambulatory Visit: Payer: Medicare Other | Admitting: Cardiovascular Disease

## 2019-07-28 ENCOUNTER — Other Ambulatory Visit: Payer: Self-pay

## 2019-07-28 ENCOUNTER — Encounter: Payer: Self-pay | Admitting: Cardiovascular Disease

## 2019-07-28 VITALS — BP 114/72 | HR 70 | Ht <= 58 in | Wt 191.0 lb

## 2019-07-28 DIAGNOSIS — E039 Hypothyroidism, unspecified: Secondary | ICD-10-CM | POA: Diagnosis not present

## 2019-07-28 DIAGNOSIS — J4520 Mild intermittent asthma, uncomplicated: Secondary | ICD-10-CM

## 2019-07-28 DIAGNOSIS — E785 Hyperlipidemia, unspecified: Secondary | ICD-10-CM

## 2019-07-28 DIAGNOSIS — I519 Heart disease, unspecified: Secondary | ICD-10-CM | POA: Diagnosis not present

## 2019-07-28 DIAGNOSIS — R0600 Dyspnea, unspecified: Secondary | ICD-10-CM | POA: Diagnosis not present

## 2019-07-28 DIAGNOSIS — R600 Localized edema: Secondary | ICD-10-CM

## 2019-07-28 DIAGNOSIS — I10 Essential (primary) hypertension: Secondary | ICD-10-CM | POA: Diagnosis not present

## 2019-07-28 DIAGNOSIS — R0609 Other forms of dyspnea: Secondary | ICD-10-CM

## 2019-07-28 DIAGNOSIS — I5189 Other ill-defined heart diseases: Secondary | ICD-10-CM

## 2019-07-28 NOTE — Progress Notes (Signed)
Patient ID: Mary Roth, female   DOB: [birth year], 86 y.o.   MRN: [id number]    HPI:  [name] is an 86 year old female who presents for a 6 week cardiology followup evaluation.   [name] has a long history of asthma and previously was under the care  Dr. Ignacia Roth. She takes  Advair PRN as well as albuterol  and also takes Flonase for rhinitis. She is a former patient of Dr. Melvern Roth. An echo Doppler study in 2012 showed concentric left ventricular hypertrophy with normal systolic function and grade 1 diastolic dysfunction. She had mild mitral annular calcification and evidence for aortic valve sclerosis Over the past several months, she had noticed increasing episodes of shortness of breath and also intermittent episodes of chest discomfort. She has noticed recent episodes of shortness of breath with walking. Approximately one month ago [place] experienced chest pressure associated with significant shortness of breath. She has noticed left arm radiation and also took a nitroglycerin which did seem to improve symptoms but this took approximately 20 minutes.   Additional problems include hyperlipidemia, hypothyroidism, and intermittent leg swelling. She was told of having rheumatic fever while in 6 grade.  She underwent an echo Doppler study on 06/20/2013 which showed normal systolic function with an ejection fraction of 55-60% but with grade 1 diastolic dysfunction. She did not have significant valvular pathology and her chamber dimensions were normal. However, she did have left ventricular hypertrophy. A nuclear perfusion study was done on 06/23/2013 and this was felt to be normal without evidence for scar or ischemia. Post stress ejection fraction was 70%.  [name] continued to experience significant shortness of breath with activity and at times she feels this has progressed to the point where she is unable to breathe. Her echo Doppler study showed  normal right ventricular pressure at 11 mm and her peak RV to RA gradient was 8 mm. She denies any calf tenderness, there is no PND or orthopnea or palpitations.   I empirically added low-dose amlodipine 2.5 mg as well as Bystolic 2.5 mg to her medical regimen. Due to her continued shortness of breath with activity I recommended a cardiopulmonary met test to evaluate for potential microvascular angina and endothelial dysfunction. This was done 04/03/2014. She had normal functional status and achieved a peak maximum oxygen consumption 92% of predicted. Her cardiovascular response to exercise was felt to be indeterminate due to suboptimal peak cardiovascular stress load. She had a blunted chronotropic response to exercise. Pulmonary response was normal. Exercise was limited by effort. PFT summary revealed spirometry, lung volumes, and diffusion were within normal limits. Her anaerobic threshold was low at 9.4.  When I  saw her in 2016 she was remaining stable.  However she was under increased stress since her husband was undergoing chemotherapy for lung cancer.  She has seen numerous extenders in our office and last saw Mary Roth on Oct 08, 2017.  In March 2019 she had undergone a nuclear perfusion study for some vague chest pain which was normal.  Retrospect she believes the pain may have been GI in etiology.  She is felt to have chronic diastolic heart failure and had some issues with lower extremity edema and Lasix was reinstituted.  I saw her in June 2019 after  not having seen her in almost 3 years.  She was living in the  [facility].  Dr. Linna Roth had retired.  She had noticed  some intermittent leg swelling and was taking spironolactone in addition to furosemide, low-dose  amlodipine and Bystolic daily.   At her  evaluation with me in December 2019 she denied  any episodes of chest pain or shortness of breath.  She has rarely been taking Lasix but has continued to take  Spironolactone 25 mg daily, Bystolic 5 mg and amlodipine 2.5 mg daily.  She continued to take rosuvastatin 5 mg for hyperlipidemia.  She was on levothyroxine 88 mcg for hypothyroidism.  During that evaluation, she had issues with ankle edema and was not taking furosemide.  I again suggested she try taking furosemide 20 mg on days if there was leg swelling.  She saw Mary Roth, Mary Roth in January 2021.  [facility] lives at [facility].  She has been staying in due to the COVID-19 pandemic.  At the time she did not have edema.    I last saw her on June 16, 2019.  She was to undergo a food challenge to red meat in light of her alpha gal reported allergy; however, this was not done.  Recently, she had noticed some increasing exertional shortness of breath with decreasing activity.  Retrospectively the may have been ongoing for approximately 6 months.  She also states that she may be experiencing some mild chest tightness when she gets short of breath.  She has not had recent laboratory.  During that evaluation, her blood pressure was labile and I recommended further titration of amlodipine to 5 mg and slightly decrease Bystolic to 2.5 mg since when she had her allergy assessment the issue was raised about potential beta-blocker reducing the effect of EpiPen if she ever required use.  With her exertional dyspnea, I recommended a 2D echo Doppler study as well as a follow-up Lexiscan Myoview study to make certain these have not significantly changed from prior evaluation.  She underwent her [place] study on June 23, 2019.  This was a low risk study with nuclear stress EF at 53%.  There was no ischemia or infarction.  There was mild anterior attenuation artifact.  A 2D echo Doppler study was done on June 29, 2019 which showed an EF of 55 to 60% with grade 1 diastolic dysfunction.  There was mildly elevated pulmonary artery systolic pressure at 01.6.  There was no significant valvular  pathology.  Since I last saw her, she has felt improved with reference to her breathing with the medication adjustment.  She had experienced several days of a transient sharp headache which ultimately resolved.  She denies chest pain PND orthopnea.  She is unaware of any arrhythmia.  She presents for evaluation.  Past Medical History:  Diagnosis Date  . Abnormal liver function test   . Alpha galactosidase deficiency   . Arthritis   . Asthma   . Chest pain    a. 06/2013 Lexi MV: no ischemia/infarct;  b. 12/2015 MV ([place]): EF 82%, no ischemia.  . DDD (degenerative disc disease), lumbar   . DDD (degenerative disc disease), lumbosacral   . Esophageal reflux   . Hepatic steatosis   . Mastodynia   . Murmur   . Osteopenia 03/2012   T score -1.4 FRAX 20%/11%; Dr Phineas Real  . Other and unspecified hyperlipidemia   . Personal history of colonic polyps 08/21/2006   ADENOMATOUS POLYP  . Progressive Dyspnea    a. 06/2013 Echo: EF 55-60%, Gr1 DD;  b. 03/2014 CPX testing: indeterminate cv response due to suboptimal cv stress  load and blunted chronotropic response. Nl pulm response, nl PFTs;  c. 12/2015 Echo [facility]): EF 55-60%, no significant valve dzs.  [name] Spinal stenosis   . Unspecified asthma(493.90) 2003    no childhood asthma  . Unspecified essential hypertension   . Unspecified hypothyroidism     Past Surgical History:  Procedure Laterality Date  . APPENDECTOMY    . CHOLECYSTECTOMY  1973    for stones  . TONSILLECTOMY    . UTERINE SUSPENSION  1968   [place] , [place]    Allergies  Allergen Reactions  . Beef (Bovine) Protein Anaphylaxis    History of Alpha Gal : Hoff meat allergy.  . Goat's Rue  [Galega] Anaphylaxis  . Lambs Quarters Anaphylaxis  . Other Anaphylaxis  . Poractant Alfa Anaphylaxis  . Pork Allergy Anaphylaxis  . Pork-Derived Products Anaphylaxis  . Cephalexin Other (See Comments)    Nausea, stomach upset, diarrhea  . Phenergan [Promethazine Hcl] Nausea And  Vomiting    Current Outpatient Medications  Medication Sig Dispense Refill  . albuterol (PROAIR HFA) 108 (90 Base) MCG/ACT inhaler Inhale 2 puffs into the lungs every 4 (four) hours as needed for wheezing or shortness of breath. 18 g 1  . albuterol (PROVENTIL) (2.5 MG/3ML) 0.083% nebulizer solution One ampule in nebulizer every 4 hours if needed for cough or wheeze. 75 mL 1  . amLODipine (NORVASC) 5 MG tablet Take 1 tablet (5 mg total) by mouth daily. 90 tablet 3  . cetirizine (ZYRTEC ALLERGY) 10 MG tablet Take 10 mg by mouth as needed.     [name] EPINEPHrine 0.3 mg/0.3 mL IJ SOAJ injection Use as directed for severe allergic reaction. 2 Device 2  . esomeprazole (NEXIUM) 40 MG capsule Take by mouth.    . fluticasone (FLONASE) 50 MCG/ACT nasal spray Place 1 spray into both nostrils daily as needed. 16 g 5  . fluticasone furoate-vilanterol (BREO ELLIPTA) 100-25 MCG/INH AEPB INHALE 1 PUFF INTO THE LUNGS BY MOUTH ONCE A DAY (Patient taking differently: Inhale 1 puff into the lungs as needed. INHALE 1 PUFF INTO THE LUNGS BY MOUTH ONCE A DAY) 1 each 5  . Fluticasone-Salmeterol (ADVAIR) 250-50 MCG/DOSE AEPB Inhale 1 puff into the lungs daily as needed (if asthma flare).     . furosemide (LASIX) 20 MG tablet Take 1 tablet (20 mg total) by mouth as needed for edema. 30 tablet 3  . levothyroxine (SYNTHROID, LEVOTHROID) 88 MCG tablet Take by mouth.    . montelukast (SINGULAIR) 10 MG tablet Take 1 tablet (10 mg total) by mouth at bedtime. 30 tablet 5  . nebivolol (BYSTOLIC) 2.5 MG tablet Take 1 tablet (2.5 mg total) by mouth daily. 90 tablet 3  . rOPINIRole (REQUIP) 0.25 MG tablet Take 1 tablet (0.25 mg total) by mouth at bedtime. 1 pill 2 hrs pre bedtime (Patient taking differently: Take 0.25 mg by mouth as needed. 1 pill 2 hrs pre bedtime) 30 tablet 2  . rosuvastatin (CRESTOR) 5 MG tablet Take 1 tablet (5 mg total) by mouth at bedtime. (Patient taking differently: Take 5 mg by mouth as needed. ) 90 tablet 3  .  spironolactone (ALDACTONE) 25 MG tablet Take 1 tablet (25 mg total) by mouth daily. NEED OV. 90 tablet 3  . nitroGLYCERIN (NITROSTAT) 0.4 MG SL tablet Place 1 tablet (0.4 mg total) under the tongue every 15 (fifteen) minutes as needed for chest pain. 90 tablet 3   No current facility-administered medications for this visit.    Socially  she is married has 2 children one grandchild. There is no tobacco history. She does drink alcohol occasionally.  Family History  Problem Relation Age of Onset  . Uterine cancer Mother   . Ovarian cancer Mother   . Eczema Mother   . Heart attack Father 67  . Prostate cancer Father   . Breast cancer Sister        x 3 sisters  . Asthma Sister   . Food Allergy Sister   . Alcohol abuse Brother        x 3  . Prostate cancer Brother        x 3 brothers  . Pancreatic cancer Brother        x 2  . Diabetes Neg Hx   . Stroke Neg Hx   . Allergic rhinitis Neg Hx   . Urticaria Neg Hx    ROS General: Negative; No fevers, chills, or night sweats;  HEENT: Negative; No changes in vision or hearing, sinus congestion, difficulty swallowing Pulmonary: Positive for asthma Cardiovascular: Negative; No chest pain, presyncope, syncope, palpitations GI: Positive for GERD GU: Negative; No dysuria, hematuria, or difficulty voiding Musculoskeletal: Negative; no myalgias, joint pain, or weakness Hematologic/Oncology: Negative; no easy bruising, bleeding Endocrine: Positive for hypothyroidism Neuro: Positive for peripheral neuropathy Skin: Negative; No rashes or skin lesions Psychiatric: Negative; No behavioral problems, depression Sleep: Negative; No snoring, daytime sleepiness, hypersomnolence, bruxism, restless legs, hypnogognic hallucinations, no cataplexy   PE BP 114/72   Pulse 70   Ht 4' 10"  (1.473 m)   Wt 191 lb (86.6 kg)   SpO2 97%   BMI 39.92 kg/m    Repeat blood pressure by me was 150/78  Wt Readings from Last 3 Encounters:  07/28/19 191 lb (86.6  kg)  06/23/19 190 lb (86.2 kg)  06/16/19 190 lb 9.6 oz (86.5 kg)   General: Alert, oriented, no distress.  Skin: normal turgor, no rashes, warm and dry HEENT: Normocephalic, atraumatic. Pupils equal round and reactive to light; sclera anicteric; extraocular muscles intact;  Nose without nasal septal hypertrophy Mouth/Parynx benign; Mallinpatti scale 3 Neck: No JVD, no carotid bruits; normal carotid upstroke Lungs: Mild intermittent faint upper airway wheezing Chest wall: without tenderness to palpitation Heart: PMI not displaced, RRR, s1 s2 normal, 1/6 systolic murmur, no diastolic murmur, no rubs, gallops, thrills, or heaves Abdomen: soft, nontender; no hepatosplenomehaly, BS+; abdominal aorta nontender and not dilated by palpation. Back: no CVA tenderness Pulses 2+ Musculoskeletal: full range of motion, normal strength, no joint deformities Extremities: Trace left greater than right lower leg/ankle edema; no clubbing cyanosis, Homan's sign negative  Neurologic: grossly nonfocal; Cranial nerves grossly wnl Psychologic: Normal mood and affect  ECG (independently read by me): Normal sinus rhythm at 70 bpm.  First-degree AV block with a parable of 216 ms.  No ectopy.  June 16, 2019 ECG (independently read by me): Sinus rhythm at 68 bpm.  Mild first-degree block. No ectopy  December 2019 ECG (independently read by me): Normal sinus rhythm at 71 bpm.  Borderline first-degree block with appeared over 204 ms.  Nonspecific ST changes.  Ectopy.  June 2019 ECG (independently read by me): Sinus rhythm with first-degree AV block.  Heart rate 64 bpm.  PR interval 212 ms.  October 2016 ECG (independently read by me): Normal  inus rhythm at 80 bpm with one isolated PVC.  QTc interval 435 ms.  PR duration 192 ms  March 2015 Last ECG (independently read by me): Normal sinus rhythm at  64 beats per minute. Normal intervals. No evidence for either an S wave in I or Q in 3.  Prior ECG of 06/08/2013  (independently read by me): Sinus rhythm at 78 beats per minute. No significant ST changes. Normal intervals.  LABS:. BMP Latest Ref Rng & Units 06/23/2019 11/05/2017 11/07/2016  Glucose 65 - 99 mg/dL 95 91 85  BUN 8 - 27 mg/dL 14 18 16   Creatinine 0.57 - 1.00 mg/dL 0.89 0.76 0.94  BUN/Creat Ratio 12 - 28 16 24 17   Sodium 134 - 144 mmol/L 143 144 143  Potassium 3.5 - 5.2 mmol/L 4.5 5.2 5.5(H)  Chloride 96 - 106 mmol/L 104 105 101  CO2 20 - 29 mmol/L 22 24 25   Calcium 8.7 - 10.3 mg/dL 9.3 9.6 10.0   Hepatic Function Latest Ref Rng & Units 06/23/2019 11/05/2017 09/25/2016  Total Protein 6.0 - 8.5 g/dL 6.5 6.3 6.7  Albumin 3.6 - 4.6 g/dL 4.1 4.0 3.5  AST 0 - 40 IU/L 18 25 42(H)  ALT 0 - 32 IU/L 10 14 27   Alk Phosphatase 39 - 117 IU/L 80 81 90  Total Bilirubin 0.0 - 1.2 mg/dL 0.4 0.3 0.5  Bilirubin, Direct 0.0 - 0.3 mg/dL - - -   CBC Latest Ref Rng & Units 06/23/2019 11/05/2017 07/08/2017  WBC 3.4 - 10.8 x10E3/uL 7.7 7.0 8.9  Hemoglobin 11.1 - 15.9 g/dL 13.8 13.5 14.0  Hematocrit 34.0 - 46.6 % 40.1 40.7 40.5  Platelets 150 - 450 x10E3/uL 211 246 248   Lab Results  Component Value Date   MCV 93 06/23/2019   MCV 94 11/05/2017   MCV 92 07/08/2017   Lab Results  Component Value Date   TSH 1.210 06/23/2019   Lab Results  Component Value Date   HGBA1C 6.1 04/27/2012   Lipid Panel     Component Value Date/Time   CHOL 183 06/23/2019 0821   TRIG 140 06/23/2019 0821   HDL 71 06/23/2019 0821   CHOLHDL 2.6 06/23/2019 0821   CHOLHDL 4 05/17/2014 0834   VLDL 26.6 05/17/2014 0834   LDLCALC 88 06/23/2019 0821   LDLDIRECT 106.8 01/10/2011 1218      RADIOLOGY: No results found.  IMPRESSION: 1. Dyspnea on exertion   2. Essential hypertension   3. Hypothyroidism, unspecified type   4. Grade I diastolic dysfunction   5. Bilateral leg edema   6. Hyperlipidemia LDL goal <100   7. Mild intermittent asthma without complication     ASSESSMENT AND PLAN: Ms. Guimont is an 86 year-old  female who has a long-standing history of asthma and in the past developed increasing shortness of breath. She has noticed several episodes of significant chest pressure and on one instance associated with left arm radiation. When I initially saw her I added low-dose amlodipine at 2.5 mg in light of this chest pressure to induced vasodilation and also added a very low dose cardioselective beta-blockade with bystolic 2.5 mg and suggested that she initiate a baby aspirin 81 mg.  Prior testing demonstrated a normal myocardial perfusion scan and echo Doppler study did show normal systolic function with grade 1 diastolic dysfunction. A cardiopulmonary met test showed fairly normal maximum oxygen consumption. However, the test was somewhat indeterminate due to to her inability to achieve a maximum heart rate. She was only to able to achieve a heart rate of 103 which was submaximal. Her heart rate response was somewhat blunted. I suspected much of her shortness of breath most likely contributed by reduced  aerobic capacity.  Pulmonary function studies were normal.  She was last seen in February 2020 she admitted to increasing episodes of exertional shortness of breath less activity.  She also complained of a vague sensation of chest tightness.  He underwent a follow-up Lexiscan Myoview study on June 23, 2019 which was low risk with EF 53% without scar or ischemia and suggestion of mild probable breast attenuation artifact.  An echo Doppler study continues to show normal systolic function.  There was grade 1 diastolic dysfunction.  There was mildly increased RV systolic pressure at 33 mm.  She has felt improved with dose reduction of Bystolic to 2.5 mg and further titration of amlodipine to 5 mg.  Her blood pressure today is excellent and on repeat by me was 120/70.  She does have very mild ankle edema.  She has a prescription for furosemide but has not taken this recently.  I have suggested that she can take this as  needed particularly if there are days with more ankle swelling.  Her lungs are clear but there is some very mild intermittent upper airway wheezing.  She is on albuterol, Breo Ellipta in addition to Advair for her asthma.  She continues to be on levothyroxine 88 mcg for hypothyroidism.  Thyroid function studies are normal from February 2021.  She continues to be on low-dose rosuvastatin and is tolerating this without myalgias.  Most recent cholesterol was 183, triglycerides 140, HDL 71 and LDL 88.  She will follow-up with her primary physician.  I will see her in 6 months for cardiology follow-up.   Troy Sine, MD, [facility] 07/28/2019 4:04 PM

## 2019-07-28 NOTE — Patient Instructions (Signed)

## 2019-07-30 ENCOUNTER — Other Ambulatory Visit: Payer: Self-pay | Admitting: Physician Assistant

## 2019-08-21 ENCOUNTER — Other Ambulatory Visit: Payer: Self-pay

## 2019-08-21 ENCOUNTER — Encounter (HOSPITAL_BASED_OUTPATIENT_CLINIC_OR_DEPARTMENT_OTHER): Payer: Self-pay | Admitting: *Deleted

## 2019-08-21 ENCOUNTER — Emergency Department (HOSPITAL_BASED_OUTPATIENT_CLINIC_OR_DEPARTMENT_OTHER)
Admission: EM | Admit: 2019-08-21 | Discharge: 2019-08-21 | Disposition: A | Discharge status: Home / Self Care | Payer: Medicare Other | Attending: Emergency Medicine | Admitting: Emergency Medicine

## 2019-08-21 DIAGNOSIS — I129 Hypertensive chronic kidney disease with stage 1 through stage 4 chronic kidney disease, or unspecified chronic kidney disease: Secondary | ICD-10-CM | POA: Insufficient documentation

## 2019-08-21 DIAGNOSIS — R2242 Localized swelling, mass and lump, left lower limb: Secondary | ICD-10-CM | POA: Insufficient documentation

## 2019-08-21 DIAGNOSIS — Z881 Allergy status to other antibiotic agents status: Secondary | ICD-10-CM | POA: Diagnosis not present

## 2019-08-21 DIAGNOSIS — E782 Mixed hyperlipidemia: Secondary | ICD-10-CM | POA: Diagnosis not present

## 2019-08-21 DIAGNOSIS — M7989 Other specified soft tissue disorders: Secondary | ICD-10-CM | POA: Diagnosis not present

## 2019-08-21 DIAGNOSIS — Z79899 Other long term (current) drug therapy: Secondary | ICD-10-CM | POA: Insufficient documentation

## 2019-08-21 DIAGNOSIS — Z888 Allergy status to other drugs, medicaments and biological substances status: Secondary | ICD-10-CM | POA: Insufficient documentation

## 2019-08-21 DIAGNOSIS — J45909 Unspecified asthma, uncomplicated: Secondary | ICD-10-CM | POA: Diagnosis not present

## 2019-08-21 DIAGNOSIS — N183 Chronic kidney disease, stage 3 unspecified: Secondary | ICD-10-CM | POA: Diagnosis not present

## 2019-08-21 DIAGNOSIS — Z91018 Allergy to other foods: Secondary | ICD-10-CM | POA: Insufficient documentation

## 2019-08-21 LAB — COMPREHENSIVE METABOLIC PANEL
ALT: 17 U/L (ref 0–44)
AST: 21 U/L (ref 15–41)
Albumin: 4.2 g/dL (ref 3.5–5.0)
Alkaline Phosphatase: 78 U/L (ref 38–126)
Anion gap: 11 (ref 5–15)
BUN: 21 mg/dL (ref 8–23)
CO2: 26 mmol/L (ref 22–32)
Calcium: 9.2 mg/dL (ref 8.9–10.3)
Chloride: 101 mmol/L (ref 98–111)
Creatinine, Ser: 1.09 mg/dL — ABNORMAL HIGH (ref 0.44–1.00)
GFR calc Af Amer: 52 mL/min — ABNORMAL LOW (ref >60)
GFR calc non Af Amer: 45 mL/min — ABNORMAL LOW (ref >60)
Glucose, Bld: 111 mg/dL — ABNORMAL HIGH (ref 70–99)
Potassium: 4.2 mmol/L (ref 3.5–5.1)
Sodium: 138 mmol/L (ref 135–145)
Total Bilirubin: 0.3 mg/dL (ref 0.3–1.2)
Total Protein: 7.7 g/dL (ref 6.5–8.1)

## 2019-08-21 LAB — CBC WITH DIFFERENTIAL/PLATELET
Abs Immature Granulocytes: 0.03 10*3/uL (ref 0.00–0.07)
Basophils Absolute: 0.1 10*3/uL (ref 0.0–0.1)
Basophils Relative: 0 %
Eosinophils Absolute: 0.4 10*3/uL (ref 0.0–0.5)
Eosinophils Relative: 3 %
HCT: 44.4 % (ref 36.0–46.0)
Hemoglobin: 14.6 g/dL (ref 12.0–15.0)
Immature Granulocytes: 0 %
Lymphocytes Relative: 13 %
Lymphs Abs: 1.5 10*3/uL (ref 0.7–4.0)
MCH: 31.9 pg (ref 26.0–34.0)
MCHC: 32.9 g/dL (ref 30.0–36.0)
MCV: 96.9 fL (ref 80.0–100.0)
Monocytes Absolute: 1 10*3/uL (ref 0.1–1.0)
Monocytes Relative: 9 %
Neutro Abs: 8.2 10*3/uL — ABNORMAL HIGH (ref 1.7–7.7)
Neutrophils Relative %: 75 %
Platelets: 220 10*3/uL (ref 150–400)
RBC: 4.58 MIL/uL (ref 3.87–5.11)
RDW: 13.7 % (ref 11.5–15.5)
WBC: 11.1 10*3/uL — ABNORMAL HIGH (ref 4.0–10.5)
nRBC: 0 % (ref 0.0–0.2)

## 2019-08-21 MED ORDER — RIVAROXABAN 15 MG PO TABS
15.0000 mg | ORAL_TABLET | Freq: Once | ORAL | Status: AC
  Administered 2019-08-21: 15 mg via ORAL
  Filled 2019-08-21: qty 1

## 2019-08-21 MED ORDER — CEPHALEXIN 500 MG PO CAPS: 500.0000 mg | ORAL_CAPSULE | Freq: Four times a day (QID) | ORAL | 0 refills | Status: DC

## 2019-08-21 NOTE — ED Triage Notes (Signed)
Pt reports left leg/ankle swollen x 1 week. Today it was red. She is concerned for blood clot

## 2019-08-21 NOTE — ED Provider Notes (Signed)
[place] EMERGENCY DEPARTMENT Provider Note   CSN: [id number] Arrival date & time: 08/21/19  2103     History Chief Complaint  Patient presents with   Leg Swelling    Mary Roth is a 86 y.o. female who presents to the ED today with complaint of gradual onset, constant, worsening, L leg swelling. Pt reports this has been a chronic issue for her however for the past week it has been increasing in swelling. She noticed some redness to her ankle today that concerned her. Pt reports the area of redness felt warm to her earlier today. She is worried she could have a DVT. No hx of DVT. Pt reports she always feels short of breath but does not feel anymore short of breath than normal. No chest pain. Denies fevers or chills.   The history is provided by the patient, the spouse and medical records.       Past Medical History:  Diagnosis Date   Abnormal liver function test    Alpha galactosidase deficiency    Arthritis    Asthma    Chest pain    a. 06/2013 [facility] MV: no ischemia/infarct;  b. 12/2015 MV ([place]): EF 82%, no ischemia.   DDD (degenerative disc disease), lumbar    DDD (degenerative disc disease), lumbosacral    Esophageal reflux    Hepatic steatosis    Mastodynia    Murmur    Osteopenia 03/2012   T score -1.4 FRAX 20%/11%; Dr Phineas Real   Other and unspecified hyperlipidemia    Personal history of colonic polyps 08/21/2006   ADENOMATOUS POLYP   Progressive Dyspnea    a. 06/2013 Echo: EF 55-60%, Gr1 DD;  b. 03/2014 CPX testing: indeterminate cv response due to suboptimal cv stress load and blunted chronotropic response. Nl pulm response, nl PFTs;  c. 12/2015 Echo [facility]): EF 55-60%, no significant valve dzs.   Spinal stenosis    Unspecified asthma(493.90) 2003    no childhood asthma   Unspecified essential hypertension    Unspecified hypothyroidism     Patient Active Problem List   Diagnosis Date Noted   Tachypnea 06/16/2019     Multiple pulmonary nodules determined by computed tomography of lung 06/24/2017   CKD (chronic kidney disease) stage 3, GFR 30-59 ml/min 12/26/2016   Cough, persistent 09/03/2016   Cyst of right ovary 05/30/2016   Acute bronchitis 02/04/2016   Anaphylactic shock due to adverse food reaction 01/01/2016   Perennial and seasonal allergic rhinitis 12/05/2015   Allergic reaction 11/07/2015   Mild intermittent asthma 11/07/2015   History of insect sting allergy 11/07/2015   Angioedema 11/07/2015   Moderate persistent asthma without complication [id number]   Palpitations 02/13/2015   Rotator cuff tear arthropathy of right shoulder 08/14/2014   Lumbar and sacral osteoarthritis 12/20/2013   Spondylolisthesis at L5-S1 level [id number]   Lichen planus [id number]   Benign paroxysmal positional nystagmus 08/20/2013   Class 2 obesity due to excess calories without serious comorbidity with body mass index (BMI) of 39.0 to 39.9 in adult 05/07/2013   Asthma with acute exacerbation 02/07/2013   Bone/cartilage disorder 05/19/2010   MASTODYNIA 05/17/2009   Mixed hyperlipidemia 01/12/2009   Essential (primary) hypertension 01/12/2009   H/O malignant neoplasm of skin 01/12/2009   History of colon polyps 01/12/2009   Gastroesophageal reflux disease [id number]   Uncomplicated asthma 0000000   Accumulation of fluid in tissues 10/07/2007   Acquired hypothyroidism 01/15/2007    Past Surgical History:  Procedure Laterality  Date   APPENDECTOMY     CHOLECYSTECTOMY  1973    for stones   TONSILLECTOMY     UTERINE SUSPENSION  1968   [place] , [place]     OB History    Gravida  2   Para  2   Term  2   Preterm      AB      Living  2     SAB      TAB      Ectopic      Multiple      Live Births              Family History  Problem Relation Age of Onset   Uterine cancer Mother    Ovarian cancer Mother    Eczema Mother    Heart attack  Father 56   Prostate cancer Father    Breast cancer Sister        x 3 sisters   Asthma Sister    Food Allergy Sister    Alcohol abuse Brother        x 3   Prostate cancer Brother        x 3 brothers   Pancreatic cancer Brother        x 2   Diabetes Neg Hx    Stroke Neg Hx    Allergic rhinitis Neg Hx    Urticaria Neg Hx     Social History   Tobacco Use   Smoking status: Never Smoker   Smokeless tobacco: Never Used  Substance Use Topics   Alcohol use: Yes    Alcohol/week: 4.0 standard drinks    Types: 4 Shots of liquor per week    Comment:  socially about 3 times a week.  drinks gin.   Drug use: No    Home Medications Prior to Admission medications   Medication Sig Start Date End Date Taking? Authorizing Provider  albuterol (PROAIR HFA) 108 (90 Base) MCG/ACT inhaler Inhale 2 puffs into the lungs every 4 (four) hours as needed for wheezing or shortness of breath. 04/25/19   Dara Hoyer, FNP  albuterol (PROVENTIL) (2.5 MG/3ML) 0.083% nebulizer solution One ampule in nebulizer every 4 hours if needed for cough or wheeze. 01/26/18   Dara Hoyer, FNP  amLODipine (NORVASC) 5 MG tablet Take 1 tablet (5 mg total) by mouth daily. 06/16/19   Troy Sine, MD  cephALEXin (KEFLEX) 500 MG capsule Take 1 capsule (500 mg total) by mouth 4 (four) times daily. 08/21/19   Eustaquio Maize, PA-C  cetirizine (ZYRTEC ALLERGY) 10 MG tablet Take 10 mg by mouth as needed.     [provider]  EPINEPHrine 0.3 mg/0.3 mL IJ SOAJ injection Use as directed for severe allergic reaction. 01/26/18   Dara Hoyer, FNP  esomeprazole (NEXIUM) 40 MG capsule Take by mouth.    [provider]  fluticasone (FLONASE) 50 MCG/ACT nasal spray Place 1 spray into both nostrils daily as needed. 04/25/19   Ambs, Kathrine Cords, FNP  fluticasone furoate-vilanterol (BREO ELLIPTA) 100-25 MCG/INH AEPB INHALE 1 PUFF INTO THE LUNGS BY MOUTH ONCE A DAY Patient taking differently: Inhale 1 puff into the  lungs as needed. INHALE 1 PUFF INTO THE LUNGS BY MOUTH ONCE A DAY 04/25/19   Ambs, Kathrine Cords, FNP  Fluticasone-Salmeterol (ADVAIR) 250-50 MCG/DOSE AEPB Inhale 1 puff into the lungs daily as needed (if asthma flare).  12/11/16   [provider]  furosemide (LASIX) 20 MG  tablet Take one 20mg  tablet by mouth daily as needed for edema, 08/02/19   Troy Sine, MD  levothyroxine (SYNTHROID, LEVOTHROID) 88 MCG tablet Take by mouth. 07/24/15   [provider]  montelukast (SINGULAIR) 10 MG tablet Take 1 tablet (10 mg total) by mouth at bedtime. 04/25/19   Dara Hoyer, FNP  nebivolol (BYSTOLIC) 2.5 MG tablet Take 1 tablet (2.5 mg total) by mouth daily. 06/16/19   Troy Sine, MD  nitroGLYCERIN (NITROSTAT) 0.4 MG SL tablet Place 1 tablet (0.4 mg total) under the tongue every 15 (fifteen) minutes as needed for chest pain. 09/11/17 04/28/18  Barrett, Evelene Croon, PA-C  rOPINIRole (REQUIP) 0.25 MG tablet Take 1 tablet (0.25 mg total) by mouth at bedtime. 1 pill 2 hrs pre bedtime Patient taking differently: Take 0.25 mg by mouth as needed. 1 pill 2 hrs pre bedtime 05/01/14   Hendricks Limes, MD  rosuvastatin (CRESTOR) 5 MG tablet Take 1 tablet (5 mg total) by mouth at bedtime. Patient taking differently: Take 5 mg by mouth as needed.  05/24/19   Almyra Deforest, PA  spironolactone (ALDACTONE) 25 MG tablet Take 1 tablet (25 mg total) by mouth daily. NEED OV. 05/24/19   Almyra Deforest, PA    Allergies    Beef (bovine) protein, Goat's rue  [galega], Lambs quarters, Other, Poractant alfa, Pork allergy, Pork-derived products, Cephalexin, and Phenergan [promethazine hcl]  Review of Systems   Review of Systems  Constitutional: Negative for chills and fever.  Respiratory: Negative for cough.   Cardiovascular: Positive for leg swelling. Negative for chest pain.  Skin: Positive for color change.  Neurological: Negative for weakness and numbness.  All other systems reviewed and are negative.   Physical  Exam Updated Vital Signs BP (!) 127/53 (BP Location: Right Wrist)    Pulse 90    Temp 98.3 F (36.8 C) (Oral)    Resp 18    Ht 4' 10.5" (1.486 m)    Wt 86.6 kg    SpO2 97%    BMI 39.24 kg/m   Physical Exam Vitals and nursing note reviewed.  Constitutional:      Appearance: She is not ill-appearing or diaphoretic.     Comments: Pleasant elderly female  HENT:     Head: Normocephalic and atraumatic.  Eyes:     Conjunctiva/sclera: Conjunctivae normal.  Cardiovascular:     Rate and Rhythm: Normal rate and regular rhythm.     Pulses: Normal pulses.  Pulmonary:     Effort: Pulmonary effort is normal.     Breath sounds: Normal breath sounds. No wheezing, rhonchi or rales.  Abdominal:     Palpations: Abdomen is soft.     Tenderness: There is no abdominal tenderness. There is no guarding or rebound.  Musculoskeletal:     Cervical back: Neck supple.     Comments: 1+ pitting edema noted to LLE. Small amount of erythema noted to the medial aspect of the ankle; no increased warmth. No TTP with light touch. No tenderness to the calf. 2+ DP pulse. ROM intact to ankle and knee joint.   Skin:    General: Skin is warm and dry.  Neurological:     Mental Status: She is alert.     ED Results / Procedures / Treatments   Labs (all labs ordered are listed, but only abnormal results are displayed) Labs Reviewed  COMPREHENSIVE METABOLIC PANEL - Abnormal; Notable for the following components:      Result Value  Glucose, Bld 111 (*)    Creatinine, Ser 1.09 (*)    GFR calc non Af Amer 45 (*)    GFR calc Af Amer 52 (*)    All other components within normal limits  CBC WITH DIFFERENTIAL/PLATELET - Abnormal; Notable for the following components:   WBC 11.1 (*)    Neutro Abs 8.2 (*)    All other components within normal limits    EKG None  Radiology No results found.  Procedures Procedures (including critical care time)  Medications Ordered in ED Medications  Rivaroxaban (XARELTO) tablet  15 mg (has no administration in time range)    ED Course  I have reviewed the triage vital signs and the nursing notes.  Pertinent labs & imaging results that were available during my care of the patient were reviewed by me and considered in my medical decision making (see chart for details).    MDM Rules/Calculators/A&P                      86 year old female who presents to the ED today with complaint of LLE swelling that has worsened over the past week. Pt reports issues intermittent with leg swelling bilaterally; on Lasix currently. Worsening swelling for the past week with new onset redness to the medial aspect of the ankle that pt reports was "hot to touch earlier." There is no increased warmth at this time. No red streaking. No tenderness to light touch. Good distal pulses. No tenderness to calf itself. Pt without hx of DVT however she does have unilateral leg swelling today. Unfortunately there is no ultrasound tech at this time. Pt will return in the morning for DVT study. Will get labs at this time to ensure no issues with kidney function before prophylactically treating tonight prior to DVT study.   Labwork obtained. Mild leukocytosis of 11.1. Creatinine 1.09. Creatinine clearance 49. Pt has multiple allergies noted to beef and pork derivatives s/2 [place] tick. Discussed case with ED Pharmacist at [facility] who recommends Xarelto 15 mg instead of Lovenox to be safe as he is unsure if she would react to this medication. Will give dose in the ED tonight. Pt scheduled for outpatient ultrasound at noon tomorrow. Given concern for increased warmth earlier today will prescribe abx to cover for cellulitis incase pt's DVT study is negative tomorrow as this could explain her swelling and redness as well. Pt is instructed to not get her abx filled until she has had her ultrasound done. If positive for DVT pt will not pick up abx. If negative for DVT she can pick up abx and take as prescribed with close  PCP follow up. Pt has reiterated instructions to me to show she is in understanding of plan. Pt stable for discharge home.   This note was prepared using Dragon voice recognition software and may include unintentional dictation errors due to the inherent limitations of voice recognition software.   Final Clinical Impression(s) / ED Diagnoses Final diagnoses:  Left leg swelling    Rx / DC Orders ED Discharge Orders         Ordered    US Venous Img Lower Unilateral Left     08/21/19 2235    cephALEXin (KEFLEX) 500 MG capsule  4 times daily     08/21/19 2326           Discharge Instructions     Return to have your ultrasound appointment as scheduled for noon tomorrow If  you have a blood clot in your leg you will be started on a blood thinner tomorrow. I have prescribed an antibiotic incase you do not have a clot in your leg to cover for a possible skin infection. DO NOT PICK UP THE ANTIBIOTIC UNTIL YOU HAVE HAD YOUR ULTRASOUND.   If you have a blood clot do not pick up the antibiotic.  If you DO NOT have a blood clot you can pick up the antibiotic to take.   Follow up with your PCP for further evaluation regarding your ED visit today       Reynaldo Minium 08/21/19 2335    Truddie Hidden, MD 08/22/19 [phone]

## 2019-08-21 NOTE — Discharge Instructions (Addendum)
Return to have your ultrasound appointment as scheduled for noon tomorrow If you have a blood clot in your leg you will be started on a blood thinner tomorrow. I have prescribed an antibiotic incase you do not have a clot in your leg to cover for a possible skin infection. DO NOT PICK UP THE ANTIBIOTIC UNTIL YOU HAVE HAD YOUR ULTRASOUND.   If you have a blood clot do not pick up the antibiotic.  If you DO NOT have a blood clot you can pick up the antibiotic to take.   Follow up with your PCP for further evaluation regarding your ED visit today

## 2019-08-22 ENCOUNTER — Ambulatory Visit (HOSPITAL_BASED_OUTPATIENT_CLINIC_OR_DEPARTMENT_OTHER)
Admission: RE | Admit: 2019-08-22 | Discharge: 2019-08-22 | Disposition: A | Discharge status: Home / Self Care | Payer: Medicare Other | Source: Ambulatory Visit | Attending: Medical | Admitting: Medical

## 2019-08-22 DIAGNOSIS — I82402 Acute embolism and thrombosis of unspecified deep veins of left lower extremity: Secondary | ICD-10-CM | POA: Diagnosis not present

## 2019-08-22 DIAGNOSIS — M7989 Other specified soft tissue disorders: Secondary | ICD-10-CM | POA: Diagnosis not present

## 2019-08-26 DIAGNOSIS — Z0000 Encounter for general adult medical examination without abnormal findings: Secondary | ICD-10-CM | POA: Diagnosis not present

## 2019-08-26 DIAGNOSIS — L03116 Cellulitis of left lower limb: Secondary | ICD-10-CM | POA: Diagnosis not present

## 2019-09-13 ENCOUNTER — Ambulatory Visit: Payer: Medicare Other | Admitting: Cardiovascular Disease

## 2019-09-29 DIAGNOSIS — E039 Hypothyroidism, unspecified: Secondary | ICD-10-CM | POA: Diagnosis not present

## 2019-09-29 DIAGNOSIS — J4540 Moderate persistent asthma, uncomplicated: Secondary | ICD-10-CM | POA: Diagnosis not present

## 2019-09-29 DIAGNOSIS — I5189 Other ill-defined heart diseases: Secondary | ICD-10-CM | POA: Diagnosis not present

## 2019-09-29 DIAGNOSIS — E782 Mixed hyperlipidemia: Secondary | ICD-10-CM | POA: Diagnosis not present

## 2019-09-29 DIAGNOSIS — I110 Hypertensive heart disease with heart failure: Secondary | ICD-10-CM | POA: Diagnosis not present

## 2019-10-20 ENCOUNTER — Telehealth: Payer: Self-pay | Admitting: Gastroenterology

## 2019-10-20 NOTE — Telephone Encounter (Signed)
Spoke with patient's husband, advised to have patient call us back once she returns home.

## 2019-10-20 NOTE — Telephone Encounter (Signed)
Patient is returning your call.  

## 2019-10-20 NOTE — Telephone Encounter (Signed)
Spoke with patient, patient states that her bowel movements are pretty regular but 2 days ago she noticed that she was having black tarry stools, pt denies any use of iron supplements or pepto-bismol. Pt states that she had some polyps removed in the past and was told to keep an eye out. No appts available until July. Pt advised to follow up with PCP or urgent care until scheduled appointment with Alonza Bogus - PA on 11/23/19 at 11 am.

## 2019-11-23 ENCOUNTER — Ambulatory Visit: Payer: Medicare Other | Admitting: Gastroenterology

## 2019-12-30 DIAGNOSIS — Z5181 Encounter for therapeutic drug level monitoring: Secondary | ICD-10-CM | POA: Diagnosis not present

## 2019-12-30 DIAGNOSIS — E039 Hypothyroidism, unspecified: Secondary | ICD-10-CM | POA: Diagnosis not present

## 2019-12-30 DIAGNOSIS — I10 Essential (primary) hypertension: Secondary | ICD-10-CM | POA: Diagnosis not present

## 2019-12-30 DIAGNOSIS — E538 Deficiency of other specified B group vitamins: Secondary | ICD-10-CM | POA: Insufficient documentation

## 2019-12-30 DIAGNOSIS — Z0000 Encounter for general adult medical examination without abnormal findings: Secondary | ICD-10-CM | POA: Diagnosis not present

## 2019-12-30 DIAGNOSIS — R300 Dysuria: Secondary | ICD-10-CM | POA: Diagnosis not present

## 2019-12-30 DIAGNOSIS — E559 Vitamin D deficiency, unspecified: Secondary | ICD-10-CM | POA: Insufficient documentation

## 2020-02-09 ENCOUNTER — Encounter: Payer: Self-pay | Admitting: Physician Assistant

## 2020-02-09 ENCOUNTER — Telehealth: Payer: Self-pay

## 2020-02-09 ENCOUNTER — Telehealth (INDEPENDENT_AMBULATORY_CARE_PROVIDER_SITE_OTHER): Payer: Medicare Other | Admitting: Physician Assistant

## 2020-02-09 VITALS — BP 122/64 | Ht <= 58 in | Wt 185.0 lb

## 2020-02-09 DIAGNOSIS — M7989 Other specified soft tissue disorders: Secondary | ICD-10-CM | POA: Diagnosis not present

## 2020-02-09 DIAGNOSIS — E785 Hyperlipidemia, unspecified: Secondary | ICD-10-CM

## 2020-02-09 DIAGNOSIS — E039 Hypothyroidism, unspecified: Secondary | ICD-10-CM | POA: Diagnosis not present

## 2020-02-09 DIAGNOSIS — I10 Essential (primary) hypertension: Secondary | ICD-10-CM | POA: Diagnosis not present

## 2020-02-09 MED ORDER — SPIRONOLACTONE 25 MG PO TABS
25.0000 mg | ORAL_TABLET | Freq: Every day | ORAL | 3 refills | Status: DC
Start: 2020-02-09 — End: 2020-09-14

## 2020-02-09 MED ORDER — FUROSEMIDE 20 MG PO TABS: ORAL_TABLET | ORAL | 1 refills | Status: DC

## 2020-02-09 MED ORDER — ROSUVASTATIN CALCIUM 5 MG PO TABS
5.0000 mg | ORAL_TABLET | Freq: Every day | ORAL | 3 refills | Status: DC
Start: 2020-02-09 — End: 2020-09-14

## 2020-02-09 MED ORDER — AMLODIPINE BESYLATE 5 MG PO TABS
5.0000 mg | ORAL_TABLET | Freq: Every day | ORAL | 3 refills | Status: DC
Start: 2020-02-09 — End: 2020-08-09

## 2020-02-09 MED ORDER — NEBIVOLOL HCL 2.5 MG PO TABS
2.5000 mg | ORAL_TABLET | Freq: Every day | ORAL | 3 refills | Status: DC
Start: 2020-02-09 — End: 2020-09-14

## 2020-02-09 NOTE — Telephone Encounter (Signed)
Called patient to discuss AVS instructions gave Hao Meng's recommendations and patient voiced understanding. AVS summary mailed to patient.    

## 2020-02-09 NOTE — Progress Notes (Signed)
Virtual Visit via Telephone Note   This visit type was conducted due to national recommendations for restrictions regarding the COVID-19 Pandemic (e.g. social distancing) in an effort to limit this patient's exposure and mitigate transmission in our community.  Due to her co-morbid illnesses, this patient is at least at moderate risk for complications without adequate follow up.  This format is felt to be most appropriate for this patient at this time.  The patient did not have access to video technology/had technical difficulties with video requiring transitioning to audio format only (telephone).  All issues noted in this document were discussed and addressed.  No physical exam could be performed with this format.  Please refer to the patient's chart for her  consent to telehealth for [facility].    Date:  02/11/2020   ID:  Mary Roth, DOB 02-Oct-1928, MRN [id number] The patient was identified using 2 identifiers.  Patient Location: Home Provider Location: Office/Clinic  PCP:  Jenel Lucks, PA-C  Cardiologist:  Shelva Majestic, MD  Electrophysiologist:  None   Evaluation Performed:  Follow-Up Visit  Chief Complaint:  Follow up  History of Present Illness:    Mary Roth is a 86 y.o. female with hx of hypertension, hyperlipidemia, hypothyroidism, history of rheumatic fever as a child, asthma, and history of heart murmur.  She was previously a patient of Dr. Melvern Banker.  Echocardiogram obtained in 2012 showed concentric LVH with normal EF, grade 1 DD.  Myoview in February 2015 was normal without scar or ischemia, EF 70%.  Echocardiogram obtained around the same time showed EF 55 to 60%, grade 1 DD, no significant valve issue.  Due to persistent dyspnea on exertion, a cardiopulmonary stress test was performed in November 2015 which showed normal functional ability, she achieved a peak of maximum O2 consumption of 92% predicted, cardiovascular response to exercise was felt  to be indeterminate due to suboptimal peak cardiovascular stress load, she had blunted chronotropic response to exercise, pulmonary response was normal, exercise was limited by effort.  PFT revealed normal lung volumes and the diffusion.  Repeat Myoview in March 2019 was normal.  She does have some intermittent leg swelling and takes Lasix and spironolactone.  Echocardiogram performed in February 2021 showed EF 55 to 60%, grade 1 DD, mildly elevated PSVT at 32.8 mmHg.  Myoview obtained in February 2021 was low risk with EF 53%.  Over the past 6 months, she has been doing quite well.  She did go to the ED in April for leg swelling, however her leg swelling has since been very well controlled.  She is on spironolactone daily and only use the Lasix as needed.  In the past 2 weeks, she has not needed any Lasix dosage.  She denies any recent chest pain.  She says her asthma is also improving and he has not needed to use her inhaler recently.  Her shortness of breath with exertion was felt to be related to the previous asthma and that has been improving as well.  She is waiting to get her COVID-19 booster shot when available to [place] residents.  Otherwise, she is doing well from cardiac perspective and can follow-up with Dr. Claiborne Billings in 6 months.  The patient does not have symptoms concerning for COVID-19 infection (fever, chills, cough, or new shortness of breath).    Past Medical History:  Diagnosis Date  . Abnormal liver function test   . Alpha galactosidase deficiency   . Arthritis   . Asthma   .  Chest pain    a. 06/2013 [facility] MV: no ischemia/infarct;  b. 12/2015 MV ([place]): EF 82%, no ischemia.  . DDD (degenerative disc disease), lumbar   . DDD (degenerative disc disease), lumbosacral   . Esophageal reflux   . Hepatic steatosis   . Mastodynia   . Murmur   . Osteopenia 03/2012   T score -1.4 FRAX 20%/11%; Dr Phineas Real  . Other and unspecified hyperlipidemia   . Personal history of colonic  polyps 08/21/2006   ADENOMATOUS POLYP  . Progressive Dyspnea    a. 06/2013 Echo: EF 55-60%, Gr1 DD;  b. 03/2014 CPX testing: indeterminate cv response due to suboptimal cv stress load and blunted chronotropic response. Nl pulm response, nl PFTs;  c. 12/2015 Echo [facility]): EF 55-60%, no significant valve dzs.  [name] Spinal stenosis   . Unspecified asthma(493.90) 2003    no childhood asthma  . Unspecified essential hypertension   . Unspecified hypothyroidism    Past Surgical History:  Procedure Laterality Date  . APPENDECTOMY    . CHOLECYSTECTOMY  1973    for stones  . TONSILLECTOMY    . UTERINE SUSPENSION  1968   [place] , [place]     Current Meds  Medication Sig  . albuterol (PROAIR HFA) 108 (90 Base) MCG/ACT inhaler Inhale 2 puffs into the lungs every 4 (four) hours as needed for wheezing or shortness of breath.  [name] albuterol (PROVENTIL) (2.5 MG/3ML) 0.083% nebulizer solution One ampule in nebulizer every 4 hours if needed for cough or wheeze.  [name] amLODipine (NORVASC) 5 MG tablet Take 1 tablet (5 mg total) by mouth daily.  . cephALEXin (KEFLEX) 500 MG capsule Take 1 capsule (500 mg total) by mouth 4 (four) times daily.  . cetirizine (ZYRTEC ALLERGY) 10 MG tablet Take 10 mg by mouth as needed.   [name] EPINEPHrine 0.3 mg/0.3 mL IJ SOAJ injection Use as directed for severe allergic reaction.  [name] esomeprazole (NEXIUM) 40 MG capsule Take by mouth.  . fluticasone (FLONASE) 50 MCG/ACT nasal spray Place 1 spray into both nostrils daily as needed.  . fluticasone furoate-vilanterol (BREO ELLIPTA) 100-25 MCG/INH AEPB INHALE 1 PUFF INTO THE LUNGS BY MOUTH ONCE A DAY (Patient taking differently: Inhale 1 puff into the lungs as needed. INHALE 1 PUFF INTO THE LUNGS BY MOUTH ONCE A DAY)  . Fluticasone-Salmeterol (ADVAIR) 250-50 MCG/DOSE AEPB Inhale 1 puff into the lungs daily as needed (if asthma flare).   . furosemide (LASIX) 20 MG tablet Take one 20mg  tablet by mouth daily as needed for edema,  .  levothyroxine (SYNTHROID, LEVOTHROID) 88 MCG tablet Take by mouth.  . montelukast (SINGULAIR) 10 MG tablet Take 1 tablet (10 mg total) by mouth at bedtime.  . nebivolol (BYSTOLIC) 2.5 MG tablet Take 1 tablet (2.5 mg total) by mouth daily.  [name] rOPINIRole (REQUIP) 0.25 MG tablet Take 1 tablet (0.25 mg total) by mouth at bedtime. 1 pill 2 hrs pre bedtime (Patient taking differently: Take 0.25 mg by mouth as needed. 1 pill 2 hrs pre bedtime)  . rosuvastatin (CRESTOR) 5 MG tablet Take 1 tablet (5 mg total) by mouth at bedtime.  [name] spironolactone (ALDACTONE) 25 MG tablet Take 1 tablet (25 mg total) by mouth daily. NEED OV.  . [DISCONTINUED] amLODipine (NORVASC) 5 MG tablet Take 1 tablet (5 mg total) by mouth daily.  . [DISCONTINUED] furosemide (LASIX) 20 MG tablet Take one 20mg  tablet by mouth daily as needed for edema,  . [DISCONTINUED] nebivolol (BYSTOLIC) 2.5 MG tablet  Take 1 tablet (2.5 mg total) by mouth daily.  . [DISCONTINUED] rosuvastatin (CRESTOR) 5 MG tablet Take 1 tablet (5 mg total) by mouth at bedtime. (Patient taking differently: Take 5 mg by mouth as needed. )  . [DISCONTINUED] spironolactone (ALDACTONE) 25 MG tablet Take 1 tablet (25 mg total) by mouth daily. NEED OV.     Allergies:   Beef (bovine) protein, Goat's rue  [galega], Lambs quarters, Other, Poractant alfa, Pork allergy, Pork-derived products, Cephalexin, and Phenergan [promethazine hcl]   Social History   Tobacco Use  . Smoking status: Never Smoker  . Smokeless tobacco: Never Used  Vaping Use  . Vaping Use: Never used  Substance Use Topics  . Alcohol use: Yes    Alcohol/week: 4.0 standard drinks    Types: 4 Shots of liquor per week    Comment:  socially about 3 times a week.  drinks gin.  . Drug use: No     Family Hx: The patient's family history includes Alcohol abuse in her brother; Asthma in her sister; Breast cancer in her sister; Eczema in her mother; Food Allergy in her sister; Heart attack (age of onset: 50)  in her father; Ovarian cancer in her mother; Pancreatic cancer in her brother; Prostate cancer in her brother and father; Uterine cancer in her mother. There is no history of Diabetes, Stroke, Allergic rhinitis, or Urticaria.  ROS:   Please see the history of present illness.     All other systems reviewed and are negative.   Prior CV studies:   The following studies were reviewed today:  Myoview 06/23/2019  Nuclear stress EF: 53%. No wall motion abnormalities  No perfusion defects suggestive of ischemia or infarct.  Mild anterior defect suggestive of breast attenuation artifact.  This is a low risk study.   Echo 06/29/2019 1. Left ventricular ejection fraction, by estimation, is 55 to 60%. The  left ventricle has normal function. The left ventricle has no regional  wall motion abnormalities. Left ventricular diastolic parameters are  consistent with Grade I diastolic  dysfunction (impaired relaxation).  2. Right ventricular systolic function is normal. The right ventricular  size is normal. There is mildly elevated pulmonary artery systolic  pressure. The estimated right ventricular systolic pressure is 40.9 mmHg.  3. The mitral valve is normal in structure and function. No evidence of  mitral valve regurgitation. No evidence of mitral stenosis.  4. The aortic valve is normal in structure and function. Aortic valve  regurgitation is not visualized. No aortic stenosis is present.  5. The inferior vena cava is normal in size with greater than 50%  respiratory variability, suggesting right atrial pressure of 3 mmHg.    Labs/Other Tests and Data Reviewed:    EKG:  An ECG dated 07/28/2019 was personally reviewed today and demonstrated:  Normal sinus rhythm without significant ST-T wave changes.  Recent Labs: 06/23/2019: TSH 1.210 08/21/2019: ALT 17; BUN 21; Creatinine, Ser 1.09; Hemoglobin 14.6; Platelets 220; Potassium 4.2; Sodium 138   Recent Lipid Panel Lab Results    Component Value Date/Time   CHOL 183 06/23/2019 08:21 AM   TRIG 140 06/23/2019 08:21 AM   HDL 71 06/23/2019 08:21 AM   CHOLHDL 2.6 06/23/2019 08:21 AM   CHOLHDL 4 05/17/2014 08:34 AM   LDLCALC 88 06/23/2019 08:21 AM   LDLDIRECT 106.8 01/10/2011 12:18 PM    Wt Readings from Last 3 Encounters:  02/09/20 185 lb (83.9 kg)  08/21/19 191 lb (86.6 kg)  07/28/19 191 lb (86.6  kg)     Objective:    Vital Signs:  BP 122/64   Ht 4\' 10"  (1.473 m)   Wt 185 lb (83.9 kg)   BMI 38.67 kg/m    VITAL SIGNS:  reviewed  ASSESSMENT & PLAN:    1. Leg swelling: Resolved.  Continue Lasix as needed  2. Hypertension: Blood pressure stable  3. Hyperlipidemia: On Crestor  4. Hypothyroidism: Continue levothyroxine.  COVID-19 Education: The signs and symptoms of COVID-19 were discussed with the patient and how to seek care for testing (follow up with PCP or arrange E-visit).  The importance of social distancing was discussed today.  Time:   Today, I have spent 4 minutes with the patient with telehealth technology discussing the above problems.     Medication Adjustments/Labs and Tests Ordered: Current medicines are reviewed at length with the patient today.  Concerns regarding medicines are outlined above.   Tests Ordered: No orders of the defined types were placed in this encounter.   Medication Changes: Meds ordered this encounter  Medications  . amLODipine (NORVASC) 5 MG tablet    Sig: Take 1 tablet (5 mg total) by mouth daily.    Dispense:  90 tablet    Refill:  3  . furosemide (LASIX) 20 MG tablet    Sig: Take one 20mg  tablet by mouth daily as needed for edema,    Dispense:  90 tablet    Refill:  1  . nebivolol (BYSTOLIC) 2.5 MG tablet    Sig: Take 1 tablet (2.5 mg total) by mouth daily.    Dispense:  90 tablet    Refill:  3    Must keep upcoming appointment for continuation refills  . rosuvastatin (CRESTOR) 5 MG tablet    Sig: Take 1 tablet (5 mg total) by mouth at bedtime.     Dispense:  90 tablet    Refill:  3  . spironolactone (ALDACTONE) 25 MG tablet    Sig: Take 1 tablet (25 mg total) by mouth daily. NEED OV.    Dispense:  90 tablet    Refill:  3    Follow Up:  In Person in 6 month(s)  Signed, Almyra Deforest, [place]  02/11/2020 9:05 PM    [facility]

## 2020-02-09 NOTE — Patient Instructions (Addendum)
Medication Instructions:  °Your physician recommends that you continue on your current medications as directed. Please refer to the Current Medication list given to you today. ° °*If you need a refill on your cardiac medications before your next appointment, please call your pharmacy* ° °Lab Work: °NONE ordered at this time of appointment  ° °If you have labs (blood work) drawn today and your tests are completely normal, you will receive your results only by: °MyChart Message (if you have MyChart) OR °A paper copy in the mail °If you have any lab test that is abnormal or we need to change your treatment, we will call you to review the results. ° °Testing/Procedures: °NONE ordered at this time of appointment  ° °Follow-Up: °At [facility], you and your health needs are our priority.  As part of our continuing mission to provide you with exceptional heart care, we have created designated Provider Care Teams.  These Care Teams include your primary Cardiologist (physician) and Advanced Practice Providers (APPs -  Physician Assistants and Nurse Practitioners) who all work together to provide you with the care you need, when you need it. ° °Your next appointment:   °6 month(s) ° °The format for your next appointment:   °In Person ° °Provider:   °Thomas Kelly, MD   ° °Other Instructions ° ° °

## 2020-02-09 NOTE — Telephone Encounter (Signed)
  Patient Consent for Virtual Visit         Mary Roth has provided verbal consent on 02/09/2020 for a virtual visit (video or telephone).   CONSENT FOR VIRTUAL VISIT FOR:  Mary Roth  By participating in this virtual visit I agree to the following:  I hereby voluntarily request, consent and authorize [facility] and its employed or contracted physicians, physician assistants, nurse practitioners or other licensed health care professionals (the Practitioner), to provide me with telemedicine health care services (the "Services") as deemed necessary by the treating Practitioner. I acknowledge and consent to receive the Services by the Practitioner via telemedicine. I understand that the telemedicine visit will involve communicating with the Practitioner through live audiovisual communication technology and the disclosure of certain medical information by electronic transmission. I acknowledge that I have been given the opportunity to request an in-person assessment or other available alternative prior to the telemedicine visit and am voluntarily participating in the telemedicine visit.  I understand that I have the right to withhold or withdraw my consent to the use of telemedicine in the course of my care at any time, without affecting my right to future care or treatment, and that the Practitioner or I may terminate the telemedicine visit at any time. I understand that I have the right to inspect all information obtained and/or recorded in the course of the telemedicine visit and may receive copies of available information for a reasonable fee.  I understand that some of the potential risks of receiving the Services via telemedicine include:  [name] Delay or interruption in medical evaluation due to technological equipment failure or disruption; . Information transmitted may not be sufficient (e.g. poor resolution of images) to allow for appropriate medical decision making by the Practitioner;  and/or  . In rare instances, security protocols could fail, causing a breach of personal health information.  Furthermore, I acknowledge that it is my responsibility to provide information about my medical history, conditions and care that is complete and accurate to the best of my ability. I acknowledge that Practitioner's advice, recommendations, and/or decision may be based on factors not within their control, such as incomplete or inaccurate data provided by me or distortions of diagnostic images or specimens that may result from electronic transmissions. I understand that the practice of medicine is not an exact science and that Practitioner makes no warranties or guarantees regarding treatment outcomes. I acknowledge that a copy of this consent can be made available to me via my patient portal ([facility]), or I can request a printed copy by calling the office of [facility].    I understand that my insurance will be billed for this visit.   I have read or had this consent read to me. . I understand the contents of this consent, which adequately explains the benefits and risks of the Services being provided via telemedicine.  . I have been provided ample opportunity to ask questions regarding this consent and the Services and have had my questions answered to my satisfaction. . I give my informed consent for the services to be provided through the use of telemedicine in my medical care

## 2020-03-26 DIAGNOSIS — D489 Neoplasm of uncertain behavior, unspecified: Secondary | ICD-10-CM | POA: Diagnosis not present

## 2020-03-27 DIAGNOSIS — D0002 Carcinoma in situ of buccal mucosa: Secondary | ICD-10-CM | POA: Diagnosis not present

## 2020-04-11 DIAGNOSIS — C069 Malignant neoplasm of mouth, unspecified: Secondary | ICD-10-CM | POA: Insufficient documentation

## 2020-04-11 DIAGNOSIS — K1321 Leukoplakia of oral mucosa, including tongue: Secondary | ICD-10-CM | POA: Diagnosis not present

## 2020-04-17 ENCOUNTER — Telehealth: Payer: Self-pay | Admitting: *Deleted

## 2020-04-17 NOTE — Telephone Encounter (Signed)
   [facility] Pre-operative Risk Assessment    HEARTCARE STAFF: - Please ensure there is not already an duplicate clearance open for this procedure. - Under Visit Info/Reason for Call, type in Other and utilize the format Clearance MM/DD/YY or Clearance TBD. Do not use dashes or single digits. - If request is for dental extraction, please clarify the # of teeth to be extracted.  Request for surgical clearance:  1. What type of surgery is being performed? Oral lesion excision and split thickness skin graft leg   2. When is this surgery scheduled? TBD   3. What type of clearance is required (medical clearance vs. Pharmacy clearance to hold med vs. Both)? medical  4. Are there any medications that need to be held prior to surgery and how long?none   5. Practice name and name of physician performing surgery? [facility]   6. What is the office phone number? [phone]   7.   What is the office fax number? [phone]  8.   Anesthesia type (None, local, MAC, general) ? general   Mary Roth 04/17/2020, 7:40 AM  _________________________________________________________________   (provider comments below)

## 2020-04-17 NOTE — Telephone Encounter (Signed)
   Primary Cardiologist: Shelva Majestic, MD  Chart reviewed and patient contacted by phone today as part of pre-operative protocol coverage. Given past medical history and time since last visit, based on ACC/AHA guidelines, ISHANVI MCQUITTY would be at acceptable risk for the planned procedure without further cardiovascular testing.   The patient is also followed for asthmatic COPD by Dr Francena Hanly at [facility]. If general anesthesia is required we would suggest contacting him for his input as well.   The patient was advised that if she develops new symptoms prior to surgery to contact our office to arrange for a follow-up visit, and she verbalized understanding.  I will route this recommendation to the requesting party via Epic fax function and remove from pre-op pool.  Please call with questions.  Kerin Ransom, PA-C 04/17/2020, 9:17 AM

## 2020-04-19 DIAGNOSIS — N3000 Acute cystitis without hematuria: Secondary | ICD-10-CM | POA: Diagnosis not present

## 2020-04-23 DIAGNOSIS — K921 Melena: Secondary | ICD-10-CM | POA: Diagnosis not present

## 2020-04-23 DIAGNOSIS — R531 Weakness: Secondary | ICD-10-CM | POA: Diagnosis not present

## 2020-04-23 DIAGNOSIS — R0602 Shortness of breath: Secondary | ICD-10-CM | POA: Diagnosis not present

## 2020-04-23 DIAGNOSIS — I440 Atrioventricular block, first degree: Secondary | ICD-10-CM | POA: Diagnosis not present

## 2020-04-24 DIAGNOSIS — R531 Weakness: Secondary | ICD-10-CM | POA: Diagnosis not present

## 2020-04-24 DIAGNOSIS — K429 Umbilical hernia without obstruction or gangrene: Secondary | ICD-10-CM | POA: Diagnosis not present

## 2020-04-24 DIAGNOSIS — K449 Diaphragmatic hernia without obstruction or gangrene: Secondary | ICD-10-CM | POA: Diagnosis not present

## 2020-04-24 DIAGNOSIS — K5730 Diverticulosis of large intestine without perforation or abscess without bleeding: Secondary | ICD-10-CM | POA: Diagnosis not present

## 2020-04-24 DIAGNOSIS — I440 Atrioventricular block, first degree: Secondary | ICD-10-CM | POA: Diagnosis not present

## 2020-04-24 DIAGNOSIS — R1084 Generalized abdominal pain: Secondary | ICD-10-CM | POA: Diagnosis not present

## 2020-04-25 DIAGNOSIS — I440 Atrioventricular block, first degree: Secondary | ICD-10-CM | POA: Diagnosis not present

## 2020-04-27 ENCOUNTER — Other Ambulatory Visit: Payer: Self-pay | Admitting: Physician Assistant

## 2020-05-15 DIAGNOSIS — Z7951 Long term (current) use of inhaled steroids: Secondary | ICD-10-CM | POA: Diagnosis not present

## 2020-05-15 DIAGNOSIS — E785 Hyperlipidemia, unspecified: Secondary | ICD-10-CM | POA: Diagnosis not present

## 2020-05-15 DIAGNOSIS — Z8619 Personal history of other infectious and parasitic diseases: Secondary | ICD-10-CM | POA: Diagnosis not present

## 2020-05-15 DIAGNOSIS — E039 Hypothyroidism, unspecified: Secondary | ICD-10-CM | POA: Diagnosis not present

## 2020-05-15 DIAGNOSIS — M7072 Other bursitis of hip, left hip: Secondary | ICD-10-CM | POA: Diagnosis not present

## 2020-05-15 DIAGNOSIS — D0002 Carcinoma in situ of buccal mucosa: Secondary | ICD-10-CM | POA: Diagnosis not present

## 2020-05-15 DIAGNOSIS — K219 Gastro-esophageal reflux disease without esophagitis: Secondary | ICD-10-CM | POA: Diagnosis not present

## 2020-05-15 DIAGNOSIS — M47816 Spondylosis without myelopathy or radiculopathy, lumbar region: Secondary | ICD-10-CM | POA: Diagnosis not present

## 2020-05-15 DIAGNOSIS — R911 Solitary pulmonary nodule: Secondary | ICD-10-CM | POA: Diagnosis not present

## 2020-05-15 DIAGNOSIS — I129 Hypertensive chronic kidney disease with stage 1 through stage 4 chronic kidney disease, or unspecified chronic kidney disease: Secondary | ICD-10-CM | POA: Diagnosis not present

## 2020-05-15 DIAGNOSIS — J45909 Unspecified asthma, uncomplicated: Secondary | ICD-10-CM | POA: Diagnosis not present

## 2020-05-15 DIAGNOSIS — N183 Chronic kidney disease, stage 3 unspecified: Secondary | ICD-10-CM | POA: Diagnosis not present

## 2020-05-15 DIAGNOSIS — C069 Malignant neoplasm of mouth, unspecified: Secondary | ICD-10-CM | POA: Diagnosis not present

## 2020-05-15 DIAGNOSIS — C0689 Malignant neoplasm of overlapping sites of other parts of mouth: Secondary | ICD-10-CM | POA: Diagnosis not present

## 2020-05-15 DIAGNOSIS — R0600 Dyspnea, unspecified: Secondary | ICD-10-CM | POA: Diagnosis not present

## 2020-05-15 DIAGNOSIS — Z79899 Other long term (current) drug therapy: Secondary | ICD-10-CM | POA: Diagnosis not present

## 2020-05-30 DIAGNOSIS — Z483 Aftercare following surgery for neoplasm: Secondary | ICD-10-CM | POA: Diagnosis not present

## 2020-05-30 DIAGNOSIS — D0000 Carcinoma in situ of oral cavity, unspecified site: Secondary | ICD-10-CM | POA: Diagnosis not present

## 2020-06-04 DIAGNOSIS — J4540 Moderate persistent asthma, uncomplicated: Secondary | ICD-10-CM | POA: Diagnosis not present

## 2020-06-04 DIAGNOSIS — N183 Chronic kidney disease, stage 3 unspecified: Secondary | ICD-10-CM | POA: Diagnosis not present

## 2020-06-24 ENCOUNTER — Other Ambulatory Visit: Payer: Self-pay | Admitting: Cardiovascular Disease

## 2020-08-06 ENCOUNTER — Other Ambulatory Visit: Payer: Self-pay | Admitting: Cardiovascular Disease

## 2020-08-08 ENCOUNTER — Other Ambulatory Visit: Payer: Self-pay | Admitting: Cardiovascular Disease

## 2020-08-08 ENCOUNTER — Telehealth: Payer: Self-pay | Admitting: Cardiovascular Disease

## 2020-08-08 NOTE — Telephone Encounter (Signed)
*  STAT* If patient is at the pharmacy, call can be transferred to refill team.   1. Which medications need to be refilled? (please list name of each medication and dose if known) amLODipine (NORVASC) 5 MG tablet  2. Which pharmacy/location (including street and city if local pharmacy) is medication to be sent to? [facility] # [place], [place]  3. Do they need a 30 day or 90 day supply? 90  Patient stated she is down to her last 3 pills

## 2020-08-09 MED ORDER — AMLODIPINE BESYLATE 5 MG PO TABS: 5.0000 mg | ORAL_TABLET | Freq: Every day | ORAL | 3 refills | Status: DC

## 2020-08-19 HISTORY — PX: MOUTH SURGERY: SHX715

## 2020-08-29 DIAGNOSIS — Z9889 Other specified postprocedural states: Secondary | ICD-10-CM | POA: Diagnosis not present

## 2020-08-29 DIAGNOSIS — C069 Malignant neoplasm of mouth, unspecified: Secondary | ICD-10-CM | POA: Diagnosis not present

## 2020-08-29 DIAGNOSIS — Z09 Encounter for follow-up examination after completed treatment for conditions other than malignant neoplasm: Secondary | ICD-10-CM | POA: Diagnosis not present

## 2020-08-29 DIAGNOSIS — Z86008 Personal history of in-situ neoplasm of other site: Secondary | ICD-10-CM | POA: Diagnosis not present

## 2020-09-05 NOTE — Progress Notes (Signed)
Cardiology Office Note:    Date:  09/14/2020   ID:  Mary Roth, DOB May 07, 1929, MRN [id number]  PCP:  Jenel Lucks, PA-C  Cardiologist:  Shelva Majestic, MD   Referring MD: Burna Cash*   Chief Complaint  Patient presents with  . Follow-up  6 mo follow up, dyspnea  History of Present Illness:    Mary Roth is a 87 y.o. female with a hx of HTN, asthma, GERD, palpitations, HLD, obesity, and CKD. She has a history of rheumatic fever as a child and heart murmur. Echo 2012 with conentric LVH, normal EF, grade 1 DD. Myoview Feb 2015 was nonischemic. Echo at that time showed EF 55-60%, grade 1 DD, and no significant valvular disease. Due to persistent DOE, cardiopulmonary stress test Nov 2015 with normal functional ability, she achieved peak max O2 consumption of 92% predicted, cardiovascular response to exercise was felt to be inderminant due to suboptimal peak cardiovascular stress load. She had blunted chronotropic response to exercise and pulmonary response was normal, exercise limited by effort PFT revealed normal lung volumes and diffusion. Repeat myoview 07/2017 normal. Due to intermittent leg swelling, she had another echo Feb 2021 with normal EF, grade 1 DD, mildly elevated PSVT at 32.8 mmHg. Nuclear stress test Feb 2021 low risk. She was last seen 02/09/20 by Almyra Deforest [facility] and was doing well from a cardiac perspective.   She presents today for 6 month follow up. Overall she is doing very well. She was reading a "medical book" for a friend and the book opened to a page that described mouth cancer similar to a lesion she had. 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 This has been slow to heal. She also describes ongoing SOB, but I think this is due to her asthma. She is not taking daily inhalers as instructed. wE reviewed her regimen and she will begin using her breo daily.   Past Medical History:  Diagnosis Date  .  Abnormal liver function test   . Alpha galactosidase deficiency   . Arthritis   . Asthma   . Chest pain    a. 06/2013 [facility] MV: no ischemia/infarct;  b. 12/2015 MV ([place]): EF 82%, no ischemia.  . DDD (degenerative disc disease), lumbar   . DDD (degenerative disc disease), lumbosacral   . Esophageal reflux   . Hepatic steatosis   . Mastodynia   . Murmur   . Osteopenia 03/2012   T score -1.4 FRAX 20%/11%; Dr Phineas Real  . Other and unspecified hyperlipidemia   . Personal history of colonic polyps 08/21/2006   ADENOMATOUS POLYP  . Progressive Dyspnea    a. 06/2013 Echo: EF 55-60%, Gr1 DD;  b. 03/2014 CPX testing: indeterminate cv response due to suboptimal cv stress load and blunted chronotropic response. Nl pulm response, nl PFTs;  c. 12/2015 Echo [facility]): EF 55-60%, no significant valve dzs.  [name] Spinal stenosis   . Unspecified asthma(493.90) 2003    no childhood asthma  . Unspecified essential hypertension   . Unspecified hypothyroidism     Past Surgical History:  Procedure Laterality Date  . APPENDECTOMY    . CHOLECYSTECTOMY  1973    for stones  . TONSILLECTOMY    . UTERINE SUSPENSION  [address] , [place]    Current Medications: Current Meds  Medication Sig  . albuterol (PROAIR HFA) 108 (90 Base) MCG/ACT inhaler Inhale 2 puffs into the lungs every 4 (  four) hours as needed for wheezing or shortness of breath.  [name] albuterol (PROVENTIL) (2.5 MG/3ML) 0.083% nebulizer solution One ampule in nebulizer every 4 hours if needed for cough or wheeze.  . cephALEXin (KEFLEX) 500 MG capsule Take 1 capsule (500 mg total) by mouth 4 (four) times daily.  . cetirizine (ZYRTEC) 10 MG tablet Take 10 mg by mouth as needed.   [name] EPINEPHrine 0.3 mg/0.3 mL IJ SOAJ injection Use as directed for severe allergic reaction.  [name] esomeprazole (NEXIUM) 40 MG capsule Take by mouth.  . fluticasone (FLONASE) 50 MCG/ACT nasal spray Place 1 spray into both nostrils daily as needed.  . fluticasone  furoate-vilanterol (BREO ELLIPTA) 100-25 MCG/INH AEPB INHALE 1 PUFF INTO THE LUNGS BY MOUTH ONCE A DAY (Patient taking differently: Inhale 1 puff into the lungs as needed. INHALE 1 PUFF INTO THE LUNGS BY MOUTH ONCE A DAY)  . Fluticasone-Salmeterol (ADVAIR) 250-50 MCG/DOSE AEPB Inhale 1 puff into the lungs daily as needed (if asthma flare).   [name] levothyroxine (SYNTHROID, LEVOTHROID) 88 MCG tablet Take by mouth.  . montelukast (SINGULAIR) 10 MG tablet Take 1 tablet (10 mg total) by mouth at bedtime.  [name] rOPINIRole (REQUIP) 0.25 MG tablet Take 1 tablet (0.25 mg total) by mouth at bedtime. 1 pill 2 hrs pre bedtime (Patient taking differently: Take 0.25 mg by mouth as needed. 1 pill 2 hrs pre bedtime)  . [DISCONTINUED] amLODipine (NORVASC) 5 MG tablet Take 1 tablet (5 mg total) by mouth daily.  . [DISCONTINUED] furosemide (LASIX) 20 MG tablet Take one 20mg  tablet by mouth daily as needed for edema,  . [DISCONTINUED] nebivolol (BYSTOLIC) 2.5 MG tablet Take 1 tablet (2.5 mg total) by mouth daily.  . [DISCONTINUED] nitroGLYCERIN (NITROSTAT) 0.4 MG SL tablet Place 1 tablet (0.4 mg total) under the tongue every 15 (fifteen) minutes as needed for chest pain.  . [DISCONTINUED] rosuvastatin (CRESTOR) 5 MG tablet Take 1 tablet (5 mg total) by mouth at bedtime.  . [DISCONTINUED] spironolactone (ALDACTONE) 25 MG tablet Take 1 tablet (25 mg total) by mouth daily. NEED OV.     Allergies:   Beef (bovine) protein, Goat's rue  [galega], Lambs quarters, Other, Poractant alfa, Pork allergy, Pork-derived products, Cephalexin, and Phenergan [promethazine hcl]   Social History   Socioeconomic History  . Marital status: Married    Spouse name: Not on file  . Number of children: 2  . Years of education: Not on file  . Highest education level: Not on file  Occupational History  . Occupation: homemaker    Employer: RETIRED  Tobacco Use  . Smoking status: Never Smoker  . Smokeless tobacco: Never Used  Vaping Use  .  Vaping Use: Never used  Substance and Sexual Activity  . Alcohol use: Yes    Alcohol/week: 4.0 standard drinks    Types: 4 Shots of liquor per week    Comment:  socially about 3 times a week.  drinks gin.  . Drug use: No  . Sexual activity: Yes  Other Topics Concern  . Not on file  Social History Narrative   Pt lives at [facility].   Social Determinants of Health   Financial Resource Strain: Not on file  Food Insecurity: Not on file  Transportation Needs: Not on file  Physical Activity: Not on file  Stress: Not on file  Social Connections: Not on file     Family History: The patient's family history includes Alcohol abuse in her brother; Asthma in her sister; Breast  cancer in her sister; Eczema in her mother; Food Allergy in her sister; Heart attack (age of onset: 24) in her father; Ovarian cancer in her mother; Pancreatic cancer in her brother; Prostate cancer in her brother and father; Uterine cancer in her mother. There is no history of Diabetes, Stroke, Allergic rhinitis, or Urticaria.  ROS:   Please see the history of present illness.     All other systems reviewed and are negative.  EKGs/Labs/Other Studies Reviewed:    The following studies were reviewed today:  Echo 06/29/19: 1. Left ventricular ejection fraction, by estimation, is 55 to 60%. The  left ventricle has normal function. The left ventricle has no regional  wall motion abnormalities. Left ventricular diastolic parameters are  consistent with Grade I diastolic  dysfunction (impaired relaxation).  2. Right ventricular systolic function is normal. The right ventricular  size is normal. There is mildly elevated pulmonary artery systolic  pressure. The estimated right ventricular systolic pressure is 69.6 mmHg.  3. The mitral valve is normal in structure and function. No evidence of  mitral valve regurgitation. No evidence of mitral stenosis.  4. The aortic valve is normal in structure and  function. Aortic valve  regurgitation is not visualized. No aortic stenosis is present.  5. The inferior vena cava is normal in size with greater than 50%  respiratory variability, suggesting right atrial pressure of 3 mmHg.   EKG:  EKG is not ordered today.    Recent Labs: No results found for requested labs within last 8760 hours.  Recent Lipid Panel    Component Value Date/Time   CHOL 183 06/23/2019 0821   TRIG 140 06/23/2019 0821   HDL 71 06/23/2019 0821   CHOLHDL 2.6 06/23/2019 0821   CHOLHDL 4 05/17/2014 0834   VLDL 26.6 05/17/2014 0834   LDLCALC 88 06/23/2019 0821   LDLDIRECT 106.8 01/10/2011 1218    Physical Exam:    VS:  BP 128/66   Pulse (!) 58   Ht 4\' 10"  (1.473 m)   Wt 180 lb 12.8 oz (82 kg)   SpO2 95%   BMI 37.79 kg/m     Wt Readings from Last 3 Encounters:  09/14/20 180 lb 12.8 oz (82 kg)  02/09/20 185 lb (83.9 kg)  08/21/19 191 lb (86.6 kg)     GEN: elderly female in NAD HEENT: Normal NECK: No JVD; No carotid bruits LYMPHATICS: No lymphadenopathy CARDIAC: RRR, no murmurs, rubs, gallops RESPIRATORY:  Clear to auscultation without rales, wheezing or rhonchi  ABDOMEN: Soft, non-tender, non-distended MUSCULOSKELETAL:  Mild B LE edema; No deformity  SKIN: Warm and dry NEUROLOGIC:  Alert and oriented x 3 PSYCHIATRIC:  Normal affect   ASSESSMENT:    1. Essential hypertension   2. Hyperlipidemia LDL goal <100   3. Grade I diastolic dysfunction   4. Dyspnea, unspecified type   5. Moderate persistent asthma, unspecified whether complicated    PLAN:    In order of problems listed above:  Hypertension - continue amlodipine, nebivolol, and spironolactone - pressure well controlled   Hyperlipidemia - continue crestor - LDL 88 Feb 2021   Leg swelling - continue PRN lasix   Dyspnea Asthma - she will begin taking daily breo inhaler - denies angina, orthopnea, and PND   Follow up in 6 months   Medication Adjustments/Labs and Tests  Ordered: Current medicines are reviewed at length with the patient today.  Concerns regarding medicines are outlined above.  No orders of the defined types were placed  in this encounter.  Meds ordered this encounter  Medications  . amLODipine (NORVASC) 5 MG tablet    Sig: Take 1 tablet (5 mg total) by mouth daily.    Dispense:  90 tablet    Refill:  3  . furosemide (LASIX) 20 MG tablet    Sig: Take one 20mg  tablet by mouth daily as needed for edema,    Dispense:  90 tablet    Refill:  1  . nitroGLYCERIN (NITROSTAT) 0.4 MG SL tablet    Sig: Place 1 tablet (0.4 mg total) under the tongue every 15 (fifteen) minutes as needed for chest pain.    Dispense:  90 tablet    Refill:  3  . rosuvastatin (CRESTOR) 5 MG tablet    Sig: Take 1 tablet (5 mg total) by mouth at bedtime.    Dispense:  90 tablet    Refill:  3  . spironolactone (ALDACTONE) 25 MG tablet    Sig: Take 1 tablet (25 mg total) by mouth daily. NEED OV.    Dispense:  90 tablet    Refill:  3  . nebivolol (BYSTOLIC) 2.5 MG tablet    Sig: Take 1 tablet (2.5 mg total) by mouth daily.    Dispense:  90 tablet    Refill:  3    Must keep upcoming appointment for continuation refills    Signed, Ledora Bottcher, PA  09/14/2020 12:19 PM    [facility]

## 2020-09-14 ENCOUNTER — Ambulatory Visit: Payer: Medicare Other | Admitting: Physician Assistant

## 2020-09-14 ENCOUNTER — Encounter: Payer: Self-pay | Admitting: Physician Assistant

## 2020-09-14 ENCOUNTER — Other Ambulatory Visit: Payer: Self-pay

## 2020-09-14 VITALS — BP 128/66 | HR 58 | Ht <= 58 in | Wt 180.8 lb

## 2020-09-14 DIAGNOSIS — I5189 Other ill-defined heart diseases: Secondary | ICD-10-CM | POA: Diagnosis not present

## 2020-09-14 DIAGNOSIS — J4540 Moderate persistent asthma, uncomplicated: Secondary | ICD-10-CM | POA: Diagnosis not present

## 2020-09-14 DIAGNOSIS — E785 Hyperlipidemia, unspecified: Secondary | ICD-10-CM

## 2020-09-14 DIAGNOSIS — I10 Essential (primary) hypertension: Secondary | ICD-10-CM | POA: Diagnosis not present

## 2020-09-14 DIAGNOSIS — R0600 Dyspnea, unspecified: Secondary | ICD-10-CM

## 2020-09-14 MED ORDER — NEBIVOLOL HCL 2.5 MG PO TABS: 2.5000 mg | ORAL_TABLET | Freq: Every day | ORAL | 3 refills | Status: DC

## 2020-09-14 MED ORDER — FUROSEMIDE 20 MG PO TABS: ORAL_TABLET | ORAL | 1 refills | Status: DC

## 2020-09-14 MED ORDER — SPIRONOLACTONE 25 MG PO TABS: 25.0000 mg | ORAL_TABLET | Freq: Every day | ORAL | 3 refills | Status: DC

## 2020-09-14 MED ORDER — ROSUVASTATIN CALCIUM 5 MG PO TABS: 5.0000 mg | ORAL_TABLET | Freq: Every day | ORAL | 3 refills | Status: DC

## 2020-09-14 MED ORDER — AMLODIPINE BESYLATE 5 MG PO TABS: 5.0000 mg | ORAL_TABLET | Freq: Every day | ORAL | 3 refills | Status: DC

## 2020-09-14 MED ORDER — NITROGLYCERIN 0.4 MG SL SUBL: 0.4000 mg | SUBLINGUAL_TABLET | SUBLINGUAL | 3 refills | Status: AC | PRN

## 2020-09-14 NOTE — Patient Instructions (Addendum)
Medication Instructions:  No Changes *If you need a refill on your cardiac medications before your next appointment, please call your pharmacy*   Lab Work: No Labs If you have labs (blood work) drawn today and your tests are completely normal, you will receive your results only by: [name] MyChart Message (if you have MyChart) OR . A paper copy in the mail If you have any lab test that is abnormal or we need to change your treatment, we will call you to review the results.   Testing/Procedures: No Testing   Follow-Up: At [facility], you and your health needs are our priority.  As part of our continuing mission to provide you with exceptional heart care, we have created designated Provider Care Teams.  These Care Teams include your primary Cardiologist (physician) and Advanced Practice Providers (APPs -  Physician Assistants and Nurse Practitioners) who all work together to provide you with the care you need, when you need it.  Your next appointment:   6 month(s)  The format for your next appointment:   In Person  Provider:   Shelva Majestic, MD   Other Instructions  Take Breo-Ellipta Daily

## 2020-11-14 ENCOUNTER — Ambulatory Visit: Payer: Medicare Other | Admitting: Family

## 2020-11-14 ENCOUNTER — Other Ambulatory Visit: Payer: Self-pay

## 2020-11-14 ENCOUNTER — Encounter: Payer: Self-pay | Admitting: Family

## 2020-11-14 VITALS — BP 130/56 | HR 67 | Temp 98.0°F | Resp 16 | Ht <= 58 in | Wt 159.4 lb

## 2020-11-14 DIAGNOSIS — J3089 Other allergic rhinitis: Secondary | ICD-10-CM

## 2020-11-14 DIAGNOSIS — J4540 Moderate persistent asthma, uncomplicated: Secondary | ICD-10-CM

## 2020-11-14 DIAGNOSIS — T7800XD Anaphylactic reaction due to unspecified food, subsequent encounter: Secondary | ICD-10-CM

## 2020-11-14 MED ORDER — EPINEPHRINE 0.3 MG/0.3ML IJ SOAJ: INTRAMUSCULAR | 1 refills | Status: AC

## 2020-11-14 NOTE — Patient Instructions (Addendum)
Asthma Continue montelukast 10 mg once a dat to prevent cough or wheeze ProAir 2 puffs as needed for cough or wheeze.  You may use ProAir 2 puffs 5-15 minutes before exercise to prevent cough or wheeze During upper respiratory infections/asthma flares add Breo 100 mcg 1 puff once a day till symptoms return to baseline.  Allergic rhinitis Continue Flonase 1-2 sprays in each nostril once a day as needed for a stuffy nose  Alpha gal allergy Continue to avoid beef, lamb, pork, . In case of an allergic reaction, give Benadryl 50 mg every 4 hours, and if life-threatening symptoms occur, inject with EpiPen 0.3 mg.  We will get lab work to follow-up on her alpha gal allergy.  Please do not get this until 4 weeks later. Start prednisone 10 mg taking 1 tablet twice a day for 4 days, then 1 tablet on day 5 and stop Refill of EpiPen sent  Call us if this treatment plan is not working well for you  Follow up in 2 months or sooner if needed

## 2020-11-14 NOTE — Progress Notes (Signed)
[address] [place] [address] Dept: [phone]  FOLLOW UP NOTE  Patient ID: Mary Roth, female    DOB: 06/18/1928  Age: 87 y.o. MRN: [id number] Date of Office Visit: 11/14/2020  Assessment  Chief Complaint: Follow-up and Angioedema (Swelling of the lips after consuming something yesterday, swelling started last night 8p, )  HPI Mary Roth is a 87 year old female who presents today for an acute visit of lip swelling.  She was last seen on June 15, 2019 by Gareth Morgan, FNP for tachypnea, asthma, allergic rhinitis, and alpha gal allergy.  Her husband is here with her today and helps provide history.  Since we last saw her she reports that she had cancer removed from the inside of her left cheek in May or June of this year.  She reports that yesterday evening around 9:00 PM she had swelling of her left upper lip and tingling of her tongue.  She denied any concomitant cardiorespiratory and gastrointestinal symptoms.  She took a Zyrtec and this did not help.  She reports that she did not take Benadryl because at one point time Dr. Verlin Fester in the past told her not to take Benadryl due to her heart.  She reports that the swelling occurred approximately 2 hours after dinner.  Last night for dinner she had rotisserie chicken from [employer] and made it into a chicken sandwich with mayonnaise.  She also had chocolate ice cream and tea.   For lunch yesterday around noon she had chicken [employer].  When she ordered the chicken [employer] she did not realize it had ham in it.  She took the ham off, but ate the chicken.  She mentions that there was an episode last week where the right side of her lip swelled and she took a Zyrtec and it went away.  She is not able to remember what she had to eat that day, but reports that she went over her diet and nothing was unusual.  The only thing that was different during this event was that she used a new tube of lipstick that she had never used before.   She reports that her EpiPen is expired and that at one point in time when she went to get a refill at [facility] that they did not have any EpiPen's but this has been a while since she is checked on this.  Otherwise she has been avoiding beef, pork, and lamb without any accidental ingestion or use of her EpiPen.  Asthma is reported as controlled with Breo 100 mcg 1 puff once a day as needed, montelukast 10 mg once a day, and albuterol as needed.  She uses her Breo inhaler maybe a couple times a month.  She denies any coughing, wheezing, tightness in chest, shortness of breath, nocturnal awakenings.  Since her last office visit she has not required any systemic steroids or made any trips to the emergency room or urgent care due to breathing problems.  She uses her albuterol inhaler approximately twice a month.  Allergic rhinitis is reported as controlled with Flonase nasal spray as needed.  She denies any rhinorrhea, nasal congestion, and postnasal drip.   Drug Allergies:  Allergies  Allergen Reactions   Beef (Bovine) Protein Anaphylaxis    History of Alpha Gal : Hoff meat allergy.   Goat's Rue  [Galega] Anaphylaxis   Lambs Quarters Anaphylaxis   Other Anaphylaxis   Poractant Alfa Anaphylaxis   Pork Allergy Anaphylaxis   Pork-Derived Products  Anaphylaxis   Cephalexin Other (See Comments)    Nausea, stomach upset, diarrhea   Phenergan [Promethazine Hcl] Nausea And Vomiting    Review of Systems: Review of Systems  Constitutional:  Negative for chills and fever.  HENT:         Denies rhinorrhea, nasal congestion, postnasal drip, and difficulty swallowing  Eyes:        Denies itchy eyes.  Reports yesterday that her left eye was watery   Respiratory:  Negative for cough, shortness of breath and wheezing.   Cardiovascular:  Negative for chest pain and palpitations.  Gastrointestinal:        Denies heartburn and reflux  Genitourinary:  Negative for dysuria.  Skin:  Negative for itching and  rash.  Neurological:  Negative for headaches.  Endo/Heme/Allergies:  Positive for environmental allergies.    Physical Exam: BP (!) 130/56 (BP Location: Right Arm, Patient Position: Sitting, Cuff Size: Large)   Pulse 67   Temp 98 F (36.7 C) (Temporal)   Resp 16   Ht 4\' 10"  (1.473 m)   Wt 159 lb 6.4 oz (72.3 kg)   SpO2 96%   BMI 33.31 kg/m    Physical Exam Constitutional:      Appearance: Normal appearance.  HENT:     Head: Normocephalic and atraumatic.     Comments: Pharynx normal, eyes normal, ears normal, nose normal    Right Ear: Tympanic membrane, ear canal and external ear normal.     Left Ear: Tympanic membrane, ear canal and external ear normal.     Nose: Nose normal.     Mouth/Throat:     Mouth: Mucous membranes are moist.     Pharynx: Oropharynx is clear.  Eyes:     Conjunctiva/sclera: Conjunctivae normal.  Cardiovascular:     Rate and Rhythm: Regular rhythm.     Heart sounds: Normal heart sounds.  Pulmonary:     Effort: Pulmonary effort is normal.     Breath sounds: Normal breath sounds.     Comments: Lungs clear to auscultation Musculoskeletal:     Cervical back: Neck supple.  Skin:    General: Skin is warm.     Comments: Swelling noted of left upper lip  Neurological:     Mental Status: She is alert and oriented to person, place, and time.  Psychiatric:        Mood and Affect: Mood normal.        Behavior: Behavior normal.        Thought Content: Thought content normal.        Judgment: Judgment normal.    Diagnostics: FVC 1.89 L, FEV1 1.45 L.  Predicted FVC 1.83 L, predicted FEV1 1.38 L.  Spirometry indicates normal ventilatory function.  Assessment and Plan: 1. Anaphylactic shock due to food, subsequent encounter   2. Moderate persistent asthma without complication   3. Perennial and seasonal allergic rhinitis     Meds ordered this encounter  Medications   EPINEPHrine 0.3 mg/0.3 mL IJ SOAJ injection    Sig: Use as directed for severe  allergic reaction.    Dispense:  2 each    Refill:  1    Dispense MYLAN     Patient Instructions  Asthma Continue montelukast 10 mg once a dat to prevent cough or wheeze ProAir 2 puffs as needed for cough or wheeze.  You may use ProAir 2 puffs 5-15 minutes before exercise to prevent cough or wheeze During upper respiratory infections/asthma flares add Breo 100  mcg 1 puff once a day till symptoms return to baseline.  Allergic rhinitis Continue Flonase 1-2 sprays in each nostril once a day as needed for a stuffy nose  Alpha gal allergy Continue to avoid beef, lamb, pork, . In case of an allergic reaction, give Benadryl 50 mg every 4 hours, and if life-threatening symptoms occur, inject with EpiPen 0.3 mg.  We will get lab work to follow-up on her alpha gal allergy.  Please do not get this until 4 weeks later. Start prednisone 10 mg taking 1 tablet twice a day for 4 days, then 1 tablet on day 5 and stop Refill of EpiPen sent  Call us if this treatment plan is not working well for you  Follow up in 2 months or sooner if needed  Return in about 2 months (around 01/15/2021), or if symptoms worsen or fail to improve.    Thank you for the opportunity to care for this patient.  Please do not hesitate to contact me with questions.  Althea Charon, FNP [facility] [place] [facility] [place]

## 2020-11-17 DIAGNOSIS — K130 Diseases of lips: Secondary | ICD-10-CM | POA: Diagnosis not present

## 2020-11-17 DIAGNOSIS — T7840XA Allergy, unspecified, initial encounter: Secondary | ICD-10-CM | POA: Diagnosis not present

## 2020-11-23 ENCOUNTER — Other Ambulatory Visit: Payer: Self-pay

## 2020-11-23 MED ORDER — ALBUTEROL SULFATE (2.5 MG/3ML) 0.083% IN NEBU: INHALATION_SOLUTION | RESPIRATORY_TRACT | 1 refills | Status: AC

## 2020-11-29 DIAGNOSIS — J4540 Moderate persistent asthma, uncomplicated: Secondary | ICD-10-CM | POA: Diagnosis not present

## 2020-11-29 DIAGNOSIS — E785 Hyperlipidemia, unspecified: Secondary | ICD-10-CM | POA: Diagnosis not present

## 2020-11-29 DIAGNOSIS — R300 Dysuria: Secondary | ICD-10-CM | POA: Diagnosis not present

## 2020-11-29 DIAGNOSIS — T7800XA Anaphylactic reaction due to unspecified food, initial encounter: Secondary | ICD-10-CM | POA: Diagnosis not present

## 2020-11-29 DIAGNOSIS — E039 Hypothyroidism, unspecified: Secondary | ICD-10-CM | POA: Diagnosis not present

## 2020-12-07 DIAGNOSIS — T7800XD Anaphylactic reaction due to unspecified food, subsequent encounter: Secondary | ICD-10-CM | POA: Diagnosis not present

## 2020-12-10 ENCOUNTER — Encounter: Payer: Self-pay | Admitting: Allergy

## 2020-12-10 ENCOUNTER — Other Ambulatory Visit: Payer: Self-pay

## 2020-12-10 ENCOUNTER — Ambulatory Visit: Payer: Medicare Other | Admitting: Allergy

## 2020-12-10 VITALS — BP 118/66 | HR 81 | Temp 97.7°F | Resp 16

## 2020-12-10 DIAGNOSIS — J3089 Other allergic rhinitis: Secondary | ICD-10-CM

## 2020-12-10 DIAGNOSIS — J4540 Moderate persistent asthma, uncomplicated: Secondary | ICD-10-CM

## 2020-12-10 DIAGNOSIS — T7800XD Anaphylactic reaction due to unspecified food, subsequent encounter: Secondary | ICD-10-CM

## 2020-12-10 DIAGNOSIS — T783XXD Angioneurotic edema, subsequent encounter: Secondary | ICD-10-CM

## 2020-12-10 NOTE — Progress Notes (Signed)
Follow-up Note  RE: Mary Roth MRN: [id number] DOB: [birth year] Date of Office Visit: 12/10/2020   History of present illness: Mary Roth is a 87 y.o. female presenting today for swelling.  She was last seen in the office on 11/14/2020 by our nurse practitioner Quita Skye.  She presents today with her husband.  She has had 4 swelling attacks including her July 9th ED visit.  They live in assisted living and states the first episode she ate french fries fried in bacon grease.  The second swelling episode she ate donut they believe fried in lard.  Both of these episodes involve lip and tongue swelling.  Thus she cut out fried foods.  Last week she her left upper arm was swollen and was a bit itchy and she also reports developed a bruised on her wrist but is not painful or bothersome. She woke up this morning with eye swelling and states was completely swollen shut and the skin of her eyelid was red.  She took Benadryl which did help improve her symptoms. She states she does eat diary products including milk and ice cream about once a week and states did have milk yesterday evening.   She has history of alpha gal allergy and does avoid red meat.  She has access to an epinephrine device.  She had a repeat alpha gal testing drawn last Friday which has not returned. She is not taking any antihistamines on a consistent basis.  She will use Benadryl as needed.  She states the Benadryl is a gelcap.  She has not required use of her ProAir or Breo.  She does take montelukast daily.   Review of systems: Review of Systems  Constitutional: Negative.   HENT: Negative.    Eyes:        See HPI  Respiratory: Negative.    Cardiovascular: Negative.   Gastrointestinal: Negative.   Musculoskeletal: Negative.   Skin:  Positive for itching and rash.  Neurological: Negative.    All other systems negative unless noted above in HPI  Past medical/social/surgical/family history have been reviewed and are  unchanged unless specifically indicated below.  No changes  Medication List: Current Outpatient Medications  Medication Sig Dispense Refill   amLODipine (NORVASC) 5 MG tablet Take 1 tablet (5 mg total) by mouth daily. 90 tablet 3   EPINEPHrine 0.3 mg/0.3 mL IJ SOAJ injection Use as directed for severe allergic reaction. 2 each 1   esomeprazole (NEXIUM) 40 MG capsule Take by mouth.     fluticasone (FLONASE) 50 MCG/ACT nasal spray Place 1 spray into both nostrils daily as needed. 16 g 5   furosemide (LASIX) 20 MG tablet Take one '20mg'$  tablet by mouth daily as needed for edema, 90 tablet 1   levothyroxine (SYNTHROID, LEVOTHROID) 88 MCG tablet Take by mouth.     montelukast (SINGULAIR) 10 MG tablet Take 1 tablet (10 mg total) by mouth at bedtime. 30 tablet 5   nebivolol (BYSTOLIC) 2.5 MG tablet Take 1 tablet (2.5 mg total) by mouth daily. 90 tablet 3   nitroGLYCERIN (NITROSTAT) 0.4 MG SL tablet Place 1 tablet (0.4 mg total) under the tongue every 15 (fifteen) minutes as needed for chest pain. 90 tablet 3   rOPINIRole (REQUIP) 0.25 MG tablet Take 1 tablet (0.25 mg total) by mouth at bedtime. 1 pill 2 hrs pre bedtime 30 tablet 2   rosuvastatin (CRESTOR) 5 MG tablet Take 1 tablet (5 mg total) by mouth at bedtime. 90 tablet 3  spironolactone (ALDACTONE) 25 MG tablet Take 1 tablet (25 mg total) by mouth daily. NEED OV. 90 tablet 3   albuterol (PROAIR HFA) 108 (90 Base) MCG/ACT inhaler Inhale 2 puffs into the lungs every 4 (four) hours as needed for wheezing or shortness of breath. (Patient not taking: Reported on 12/10/2020) 18 g 1   albuterol (PROVENTIL) (2.5 MG/3ML) 0.083% nebulizer solution One ampule in nebulizer every 4 hours if needed for cough or wheeze. (Patient not taking: Reported on 12/10/2020) 75 mL 1   cetirizine (ZYRTEC) 10 MG tablet Take 10 mg by mouth as needed.  (Patient not taking: Reported on 12/10/2020)     fluticasone furoate-vilanterol (BREO ELLIPTA) 100-25 MCG/INH AEPB INHALE 1 PUFF  INTO THE LUNGS BY MOUTH ONCE A DAY (Patient not taking: Reported on 12/10/2020) 1 each 5   Fluticasone-Salmeterol (ADVAIR) 250-50 MCG/DOSE AEPB Inhale 1 puff into the lungs daily as needed (if asthma flare).  (Patient not taking: Reported on 12/10/2020)     No current facility-administered medications for this visit.     Known medication allergies: Allergies  Allergen Reactions   Beef (Bovine) Protein Anaphylaxis    History of Alpha Gal : Hoff meat allergy.   Goat's Rue  [Galega] Anaphylaxis   Lambs Quarters Anaphylaxis   Other Anaphylaxis   Poractant Alfa Anaphylaxis   Pork Allergy Anaphylaxis   Pork-Derived Products Anaphylaxis   Cephalexin Other (See Comments)    Nausea, stomach upset, diarrhea   Phenergan [Promethazine Hcl] Nausea And Vomiting     Physical examination: Blood pressure 118/66, pulse 81, temperature 97.7 F (36.5 C), temperature source Temporal, resp. rate 16, SpO2 98 %.  General: Alert, interactive, in no acute distress. HEENT: PERRLA, TMs pearly gray, turbinates non-edematous without discharge, post-pharynx non erythematous. Neck: Supple without lymphadenopathy. Lungs: Clear to auscultation without wheezing, rhonchi or rales. {no increased work of breathing. CV: Normal S1, S2 without murmurs. Abdomen: Nondistended, nontender. Skin: Slight edema of the upper and lower lid with mild erythema . Extremities:  No clubbing, cyanosis or edema. Neuro:   Grossly intact.  Diagnositics/Labs: None today  Assessment and plan: Swelling (Angioedema) Alpha gal allergy - 4 recent swelling episodes - most likely related to alpha gal allergy - continue avoidance of red meat in diet and would now avoid dairy and gelatin products in the diet - have access to self-injectable epinephrine (Epipen or AuviQ) 0.'3mg'$  at all times - follow emergency action plan in case of allergic reaction - will see if we can add on testing for hereditary angioedema which is an entity were you can  have spontaneous swelling episodes that is non histamine driven - recommend taking Zyrtec '10mg'$  daily at this time to help reduce risk of swelling  Asthma - Continue montelukast 10 mg once a dat to prevent cough or wheeze -have access to albuterol inhaler 2 puffs every 4-6 hours as needed for cough/wheeze/shortness of breath/chest tightness.  May use 15-20 minutes prior to activity.   Monitor frequency of use.   - During upper respiratory infections/asthma flares add Breo 100 mcg 1 puff once a day till symptoms return to baseline.  Allergic rhinitis - Continue Flonase 1-2 sprays in each nostril once a day as needed for a stuffy nose  Call us if this treatment plan is not working well for you  Follow up in 4-6 months or sooner if needed  I appreciate the opportunity to take part in [place] care. Please do not hesitate to contact me with questions.  Sincerely,  Prudy Feeler, MD Allergy/Immunology [facility]

## 2020-12-10 NOTE — Patient Instructions (Addendum)
Swelling (Angioedema) Alpha gal allergy - 4 recent swelling episodes - most likely related to alpha gal allergy - continue avoidance of red meat in diet and would now avoid dairy and gelatin products in the diet - have access to self-injectable epinephrine (Epipen or AuviQ) 0.'3mg'$  at all times - follow emergency action plan in case of allergic reaction - will see if we can add on testing for hereditary angioedema which is an entity were you can have spontaneous swelling episodes that is non histamine driven - recommend taking Zyrtec '10mg'$  daily at this time to help reduce risk of swelling  Asthma - Continue montelukast 10 mg once a dat to prevent cough or wheeze -have access to albuterol inhaler 2 puffs every 4-6 hours as needed for cough/wheeze/shortness of breath/chest tightness.  May use 15-20 minutes prior to activity.   Monitor frequency of use.   - During upper respiratory infections/asthma flares add Breo 100 mcg 1 puff once a day till symptoms return to baseline.  Allergic rhinitis - Continue Flonase 1-2 sprays in each nostril once a day as needed for a stuffy nose  Call us if this treatment plan is not working well for you  Follow up in 4-6 months or sooner if needed

## 2020-12-11 LAB — ALPHA-GAL PANEL
Allergen Lamb IgE: 0.19 kU/L — AB
Beef IgE: 0.32 kU/L — AB
IgE (Immunoglobulin E), Serum: 83 IU/mL (ref 6–495)
O215-IgE Alpha-Gal: 2.12 kU/L — AB
Pork IgE: 0.2 kU/L — AB

## 2020-12-11 NOTE — Addendum Note (Signed)
Addended by: Theresia Lo on: 12/11/2020 09:28 AM   Modules accepted: Orders

## 2020-12-11 NOTE — Progress Notes (Signed)
Please let Mary Roth know that her alpha gal lab work was elevated. She needs to continue to avoid all red meat, dairy, and gelatin products in foods. Also, make sure that she has an updated epinephrine auto injector device.

## 2020-12-18 ENCOUNTER — Ambulatory Visit: Payer: Medicare Other | Admitting: Obstetrics and Gynecology

## 2020-12-18 ENCOUNTER — Other Ambulatory Visit: Payer: Self-pay

## 2020-12-18 ENCOUNTER — Encounter: Payer: Self-pay | Admitting: Obstetrics and Gynecology

## 2020-12-18 VITALS — BP 122/68 | HR 66 | Ht <= 58 in | Wt 180.0 lb

## 2020-12-18 DIAGNOSIS — N762 Acute vulvitis: Secondary | ICD-10-CM | POA: Diagnosis not present

## 2020-12-18 DIAGNOSIS — R350 Frequency of micturition: Secondary | ICD-10-CM | POA: Diagnosis not present

## 2020-12-18 DIAGNOSIS — R109 Unspecified abdominal pain: Secondary | ICD-10-CM | POA: Diagnosis not present

## 2020-12-18 DIAGNOSIS — R300 Dysuria: Secondary | ICD-10-CM

## 2020-12-18 MED ORDER — BETAMETHASONE VALERATE 0.1 % EX OINT
TOPICAL_OINTMENT | CUTANEOUS | 0 refills | Status: DC
Start: 2020-12-18 — End: 2023-08-20

## 2020-12-18 NOTE — Progress Notes (Signed)
87 y.o. G11P2002 Married White or Caucasian Not Hispanic or Latino female here for left lower abdomenal discomfort.  She c/o a several week history of intermittent aching in the LLQ, 3-4/10 in severity. Normal BM every day.  No vaginal bleeding. No vaginal d/c.   She has urinary incontinence and wears pads, she does get irritated with the pads.   She c/o a 2 week history an increase in nocturia. She is currently voiding 6 x a night which is up from 2 x a night. Also having mild dysuria and urinary frequency. No fevers, no nausea, no flank pain.   No LMP recorded. Patient is postmenopausal.          Sexually active: No.  The current method of family planning is post menopausal status.    Exercising: No.  The patient does not participate in regular exercise at present.   Health Maintenance: Pap:  05/22/10 WNL  History of abnormal Pap:  no MMG:  05/03/09 density B Bi-rads 1 neg  BMD:   04/01/12 low bone mass  Colonoscopy: 03/03/12  TDaP:  unsure   Gardasil: n/a   reports that she has never smoked. She has never used smokeless tobacco. She reports current alcohol use of about 4.0 standard drinks of alcohol per week. She reports that she does not use drugs.  Past Medical History:  Diagnosis Date   Abnormal liver function test    Alpha galactosidase deficiency    Arthritis    Asthma    Chest pain    a. 06/2013 [facility] MV: no ischemia/infarct;  b. 12/2015 MV ([place]): EF 82%, no ischemia.   DDD (degenerative disc disease), lumbar    DDD (degenerative disc disease), lumbosacral    Esophageal reflux    Hepatic steatosis    Mastodynia    Murmur    Osteopenia 03/2012   T score -1.4 FRAX 20%/11%; Dr Phineas Real   Other and unspecified hyperlipidemia    Personal history of colonic polyps 08/21/2006   ADENOMATOUS POLYP   Progressive Dyspnea    a. 06/2013 Echo: EF 55-60%, Gr1 DD;  b. 03/2014 CPX testing: indeterminate cv response due to suboptimal cv stress load and blunted chronotropic  response. Nl pulm response, nl PFTs;  c. 12/2015 Echo [facility]): EF 55-60%, no significant valve dzs.   Spinal stenosis    Unspecified asthma(493.90) 2003    no childhood asthma   Unspecified essential hypertension    Unspecified hypothyroidism     Past Surgical History:  Procedure Laterality Date   APPENDECTOMY     CHOLECYSTECTOMY  05/13/1971    for stones   MOUTH SURGERY Left 08/19/2020   done at [facility]  05/12/1966   [place] , [place]    Current Outpatient Medications  Medication Sig Dispense Refill   albuterol (PROAIR HFA) 108 (90 Base) MCG/ACT inhaler Inhale 2 puffs into the lungs every 4 (four) hours as needed for wheezing or shortness of breath. 18 g 1   albuterol (PROVENTIL) (2.5 MG/3ML) 0.083% nebulizer solution One ampule in nebulizer every 4 hours if needed for cough or wheeze. 75 mL 1   amLODipine (NORVASC) 5 MG tablet Take 1 tablet (5 mg total) by mouth daily. 90 tablet 3   cetirizine (ZYRTEC) 10 MG tablet Take 10 mg by mouth as needed.     EPINEPHrine 0.3 mg/0.3 mL IJ SOAJ injection Use as directed for severe allergic reaction. 2 each 1   esomeprazole (NEXIUM) 40  MG capsule Take by mouth.     fluticasone (FLONASE) 50 MCG/ACT nasal spray Place 1 spray into both nostrils daily as needed. 16 g 5   fluticasone furoate-vilanterol (BREO ELLIPTA) 100-25 MCG/INH AEPB INHALE 1 PUFF INTO THE LUNGS BY MOUTH ONCE A DAY 1 each 5   Fluticasone-Salmeterol (ADVAIR) 250-50 MCG/DOSE AEPB Inhale 1 puff into the lungs daily as needed (if asthma flare).     furosemide (LASIX) 20 MG tablet Take one '20mg'$  tablet by mouth daily as needed for edema, 90 tablet 1   levothyroxine (SYNTHROID, LEVOTHROID) 88 MCG tablet Take by mouth.     montelukast (SINGULAIR) 10 MG tablet Take 1 tablet (10 mg total) by mouth at bedtime. 30 tablet 5   nebivolol (BYSTOLIC) 2.5 MG tablet Take 1 tablet (2.5 mg total) by mouth daily. 90 tablet 3   rOPINIRole (REQUIP) 0.25 MG  tablet Take 1 tablet (0.25 mg total) by mouth at bedtime. 1 pill 2 hrs pre bedtime 30 tablet 2   rosuvastatin (CRESTOR) 5 MG tablet Take 1 tablet (5 mg total) by mouth at bedtime. 90 tablet 3   spironolactone (ALDACTONE) 25 MG tablet Take 1 tablet (25 mg total) by mouth daily. NEED OV. 90 tablet 3   nitroGLYCERIN (NITROSTAT) 0.4 MG SL tablet Place 1 tablet (0.4 mg total) under the tongue every 15 (fifteen) minutes as needed for chest pain. 90 tablet 3   No current facility-administered medications for this visit.    Family History  Problem Relation Age of Onset   Uterine cancer Mother    Ovarian cancer Mother    Eczema Mother    Heart attack Father 80   Prostate cancer Father    Breast cancer Sister        x 3 sisters   Asthma Sister    Food Allergy Sister    Alcohol abuse Brother        x 3   Prostate cancer Brother        x 3 brothers   Pancreatic cancer Brother        x 2   Diabetes Neg Hx    Stroke Neg Hx    Allergic rhinitis Neg Hx    Urticaria Neg Hx     Review of Systems  Exam:   BP 122/68   Pulse 66   Ht '4\' 10"'$  (1.473 m)   Wt 180 lb (81.6 kg)   SpO2 100%   BMI 37.62 kg/m   Weight change: '@WEIGHTCHANGE'$ @ Height:   Height: '4\' 10"'$  (147.3 cm)  Ht Readings from Last 3 Encounters:  12/18/20 '4\' 10"'$  (1.473 m)  11/14/20 '4\' 10"'$  (1.473 m)  09/14/20 '4\' 10"'$  (1.473 m)    General appearance: alert, cooperative and appears stated age CVA: not tender Abdomen: soft, mildly tender in the lateral, mid left abdomen; non distended,  no masses,  no organomegaly   Pelvic: External genitalia:  erythematous, covered in a white discharge, able to partially remove. The vulva is erythematous, no clear lesions. There is loss of architecture, the labia have fused in the midline, the clitoris is not seen. The opening to the vagina is too small to admit a pediatric speculum or finger.               Urethra:  normal appearing urethra with no masses, tenderness or lesions               Bartholins and Skenes: normal  Vagina: not seen                            Bimanual Exam via rectal exam:  no masses or tenderness  Gae Dry chaperoned for the exam.  1. Left lateral abdominal pain Not consistent with a gyn source, recommended that she f/u with her primary  2. Dysuria I suspect the dysuria is external - Urinalysis,Complete w/RFL Culture  3. Frequency of urination Increase in nocturia.  - Urinalysis,Complete w/RFL Culture -Will await results prior to treating  4. Acute vulvitis Suspect from chronic incontinence and use of pads - WET PREP FOR TRICH, YEAST, CLUE: negative - betamethasone valerate ointment (VALISONE) 0.1 %; Apply a pea sized amount topically BID for 2 weeks.  Dispense: 30 g; Refill: 0 - SureSwab Bacterial Vaginosis/itis -Discussed vulvar skin care, information given -Use Vaseline when she is done with the steroid ointment -F/U in 2 weeks if she isn't better.

## 2020-12-19 LAB — WET PREP FOR TRICH, YEAST, CLUE

## 2020-12-19 NOTE — Progress Notes (Signed)
Yes, I did not mind. I just wanted to keep you up to date since you wanted them done.

## 2020-12-19 NOTE — Progress Notes (Signed)
Please let the patient know that the labs that Dr. Nelva Bush added on to check for angioedema were normal.  Please have her continue to avoid all red meat, dairy, and gelatin products in foods.  Also, make sure she has access to her epinephrine autoinjector device at all times.

## 2020-12-22 LAB — URINALYSIS, COMPLETE W/RFL CULTURE
Hgb urine dipstick: NEGATIVE
Hyaline Cast: NONE SEEN /LPF
Nitrites, Initial: NEGATIVE
RBC / HPF: NONE SEEN /HPF (ref 0–2)
Specific Gravity, Urine: 1.02 (ref 1.001–1.035)
pH: 5.5 (ref 5.0–8.0)

## 2020-12-22 LAB — URINE CULTURE
MICRO NUMBER:: 12224972
SPECIMEN QUALITY:: ADEQUATE

## 2020-12-22 LAB — CULTURE INDICATED

## 2020-12-23 LAB — SURESWAB BACTERIAL VAGINOSIS/ITIS
Atopobium vaginae: NOT DETECTED Log (cells/mL)
C. albicans, DNA: NOT DETECTED
C. glabrata, DNA: NOT DETECTED
C. parapsilosis, DNA: DETECTED — AB
C. tropicalis, DNA: NOT DETECTED
Gardnerella vaginalis: NOT DETECTED Log (cells/mL)
LACTOBACILLUS SPECIES: NOT DETECTED Log (cells/mL)
MEGASPHAERA SPECIES: NOT DETECTED Log (cells/mL)
Trichomonas vaginalis RNA: NOT DETECTED

## 2020-12-24 ENCOUNTER — Other Ambulatory Visit: Payer: Self-pay

## 2020-12-24 MED ORDER — FLUCONAZOLE 150 MG PO TABS: ORAL_TABLET | ORAL | 0 refills | Status: DC

## 2020-12-24 MED ORDER — SULFAMETHOXAZOLE-TRIMETHOPRIM 800-160 MG PO TABS: 1.0000 | ORAL_TABLET | Freq: Two times a day (BID) | ORAL | 0 refills | Status: DC

## 2020-12-25 ENCOUNTER — Telehealth: Payer: Self-pay

## 2020-12-25 NOTE — Telephone Encounter (Signed)
Patient left voicemail requesting a return call to discuss MyChart results. Patient stated that she is not active on MyChart and does not have a way to see results.

## 2020-12-25 NOTE — Telephone Encounter (Signed)
I spoke with her at 4:49 pm yesterday and informed her of results and reviewed directions. Do you think that might be an old message?

## 2020-12-25 NOTE — Telephone Encounter (Signed)
Yes, message was from yesterday. Thank you.  Encounter closed.

## 2021-01-03 LAB — SPECIMEN STATUS REPORT

## 2021-01-03 LAB — COMPLEMENT COMPONENT C1Q: Complement C1Q: 18.5 mg/dL (ref 10.3–20.5)

## 2021-01-03 LAB — C1 ESTERASE INHIBITOR: C1INH SerPl-mCnc: 36 mg/dL (ref 21–39)

## 2021-01-03 LAB — C4 COMPLEMENT: Complement C4, Serum: 31 mg/dL (ref 12–38)

## 2021-01-24 DIAGNOSIS — M1711 Unilateral primary osteoarthritis, right knee: Secondary | ICD-10-CM | POA: Diagnosis not present

## 2021-01-24 DIAGNOSIS — M25561 Pain in right knee: Secondary | ICD-10-CM | POA: Diagnosis not present

## 2021-02-07 DIAGNOSIS — T783XXA Angioneurotic edema, initial encounter: Secondary | ICD-10-CM | POA: Diagnosis not present

## 2021-02-07 DIAGNOSIS — Z91018 Allergy to other foods: Secondary | ICD-10-CM | POA: Diagnosis not present

## 2021-03-22 DIAGNOSIS — J4540 Moderate persistent asthma, uncomplicated: Secondary | ICD-10-CM | POA: Diagnosis not present

## 2021-03-22 DIAGNOSIS — E039 Hypothyroidism, unspecified: Secondary | ICD-10-CM | POA: Diagnosis not present

## 2021-03-22 DIAGNOSIS — E559 Vitamin D deficiency, unspecified: Secondary | ICD-10-CM | POA: Diagnosis not present

## 2021-03-22 DIAGNOSIS — Z5181 Encounter for therapeutic drug level monitoring: Secondary | ICD-10-CM | POA: Diagnosis not present

## 2021-03-22 DIAGNOSIS — E538 Deficiency of other specified B group vitamins: Secondary | ICD-10-CM | POA: Diagnosis not present

## 2021-03-22 DIAGNOSIS — Z0000 Encounter for general adult medical examination without abnormal findings: Secondary | ICD-10-CM | POA: Diagnosis not present

## 2021-03-22 DIAGNOSIS — I10 Essential (primary) hypertension: Secondary | ICD-10-CM | POA: Diagnosis not present

## 2021-06-04 DIAGNOSIS — J4540 Moderate persistent asthma, uncomplicated: Secondary | ICD-10-CM | POA: Diagnosis not present

## 2021-06-04 DIAGNOSIS — N183 Chronic kidney disease, stage 3 unspecified: Secondary | ICD-10-CM | POA: Diagnosis not present

## 2021-07-09 ENCOUNTER — Other Ambulatory Visit: Payer: Self-pay | Admitting: Cardiovascular Disease

## 2021-07-11 ENCOUNTER — Encounter: Payer: Self-pay | Admitting: Cardiovascular Disease

## 2021-07-11 ENCOUNTER — Encounter: Payer: Self-pay | Admitting: Obstetrics and Gynecology

## 2021-07-12 NOTE — Telephone Encounter (Signed)
Yes, please let the patient know that I'm so sorry she is struggling. I do think that her primary care provider is the correct provider to manage medications for sleep and anxiety.  ?

## 2021-07-12 NOTE — Telephone Encounter (Signed)
Should patient schedule office visit with PCP to manage? ?

## 2021-09-16 ENCOUNTER — Other Ambulatory Visit: Payer: Self-pay

## 2021-09-16 MED ORDER — SPIRONOLACTONE 25 MG PO TABS: 25.0000 mg | ORAL_TABLET | Freq: Every day | ORAL | 1 refills | Status: DC

## 2021-10-15 ENCOUNTER — Ambulatory Visit: Payer: Medicare Other | Admitting: Cardiovascular Disease

## 2021-10-15 ENCOUNTER — Encounter: Payer: Self-pay | Admitting: Cardiovascular Disease

## 2021-10-15 DIAGNOSIS — E785 Hyperlipidemia, unspecified: Secondary | ICD-10-CM

## 2021-10-15 DIAGNOSIS — I10 Essential (primary) hypertension: Secondary | ICD-10-CM | POA: Diagnosis not present

## 2021-10-15 DIAGNOSIS — E039 Hypothyroidism, unspecified: Secondary | ICD-10-CM | POA: Diagnosis not present

## 2021-10-15 DIAGNOSIS — M25472 Effusion, left ankle: Secondary | ICD-10-CM

## 2021-10-15 DIAGNOSIS — J4520 Mild intermittent asthma, uncomplicated: Secondary | ICD-10-CM | POA: Diagnosis not present

## 2021-10-15 NOTE — Patient Instructions (Signed)
Medication Instructions:  The current medical regimen is effective;  continue present plan and medications.  *If you need a refill on your cardiac medications before your next appointment, please call your pharmacy*   Lab Work: Fasting lab work (CBC, CMET, TSH, LIPID) (come back, nothing to eat or drink, no lab appointment needed)  If you have labs (blood work) drawn today and your tests are completely normal, you will receive your results only by: MyChart Message (if you have MyChart) OR A paper copy in the mail If you have any lab test that is abnormal or we need to change your treatment, we will call you to review the results.   Follow-Up: At [facility], you and your health needs are our priority.  As part of our continuing mission to provide you with exceptional heart care, we have created designated Provider Care Teams.  These Care Teams include your primary Cardiologist (physician) and Advanced Practice Providers (APPs -  Physician Assistants and Nurse Practitioners) who all work together to provide you with the care you need, when you need it.  We recommend signing up for the patient portal called "MyChart".  Sign up information is provided on this After Visit Summary.  MyChart is used to connect with patients for Virtual Visits (Telemedicine).  Patients are able to view lab/test results, encounter notes, upcoming appointments, etc.  Non-urgent messages can be sent to your provider as well.   To learn more about what you can do with MyChart, go to [email or web address].    Your next appointment:   12 month(s)  The format for your next appointment:   In Person  Provider:   Shelva Majestic, MD

## 2021-10-15 NOTE — Progress Notes (Signed)
Patient ID: ASHELEY HELLBERG, female   DOB: Sep 18, 1928, 88 y.o.   MRN: [id number]    HPI:  Ms. Kyleen Villatoro is an 53 -year-old female who presents for a 68 month cardiology followup evaluation.   Ms. Stellmach has a long history of asthma and previously was under the care  Dr. Ignacia Palma. She takes  Advair PRN as well as albuterol  and also takes Flonase for rhinitis. She is a former patient of Dr. Melvern Banker. An echo Doppler study in 2012 showed concentric left ventricular hypertrophy with normal systolic function and grade 1 diastolic dysfunction. She had mild mitral annular calcification and evidence for aortic valve sclerosis Over the past several months, she had noticed increasing episodes of shortness of breath and also intermittent episodes of chest discomfort. She has noticed recent episodes of shortness of breath with walking. Approximately one month ago [place] she experienced chest pressure associated with significant shortness of breath. She has noticed left arm radiation and also took a nitroglycerin which did seem to improve symptoms but this took approximately 20 minutes.   Additional problems include hyperlipidemia, hypothyroidism, and intermittent leg swelling. She was told of having rheumatic fever while in 6 grade.  She underwent an echo Doppler study on 06/20/2013 which showed normal systolic function with an ejection fraction of 55-60% but with grade 1 diastolic dysfunction. She did not have significant valvular pathology and her chamber dimensions were normal. However, she did have left ventricular hypertrophy. A nuclear perfusion study was done on 06/23/2013 and this was felt to be normal without evidence for scar or ischemia. Post stress ejection fraction was 70%.  Ms. Hashman continued to experience significant shortness of breath with activity and at times she feels this has progressed to the point where she is unable to breathe. Her echo Doppler study showed  normal right ventricular pressure at 11 mm and her peak RV to RA gradient was 8 mm. She denies any calf tenderness, there is no PND or orthopnea or palpitations.   I empirically added low-dose amlodipine 2.5 mg as well as Bystolic 2.5 mg to her medical regimen. Due to her continued shortness of breath with activity I recommended a cardiopulmonary met test to evaluate for potential microvascular angina and endothelial dysfunction. This was done 04/03/2014. She had normal functional status and achieved a peak maximum oxygen consumption 92% of predicted. Her cardiovascular response to exercise was felt to be indeterminate due to suboptimal peak cardiovascular stress load. She had a blunted chronotropic response to exercise. Pulmonary response was normal. Exercise was limited by effort. PFT summary revealed spirometry, lung volumes, and diffusion were within normal limits. Her anaerobic threshold was low at 9.4.  When I  saw her in 2016 she was remaining stable.  However she was under increased stress since her husband was undergoing chemotherapy for lung cancer.  She has seen numerous extenders in our office and last saw Rosaria Ferries on Oct 08, 2017.  In March 2019 she had undergone a nuclear perfusion study for some vague chest pain which was normal.  Retrospect she believes the pain may have been GI in etiology.  She is felt to have chronic diastolic heart failure and had some issues with lower extremity edema and Lasix was reinstituted.  I saw her in June 2019 after  not having seen her in almost 3 years.  She was living in the  [facility].  Dr. Linna Darner had retired.  She had  noticed some intermittent leg swelling and was taking spironolactone in addition to furosemide, low-dose  amlodipine and Bystolic daily.   At her evaluation with me in December 2019 she denied  any episodes of chest pain or shortness of breath.  She has rarely been taking Lasix but has continued to take  Spironolactone 25 mg daily, Bystolic 5 mg and amlodipine 2.5 mg daily.  She continued to take rosuvastatin 5 mg for hyperlipidemia.  She was on levothyroxine 88 mcg for hypothyroidism.  During that evaluation, she had issues with ankle edema and was not taking furosemide.  I again suggested she try taking furosemide 20 mg on days if there was leg swelling.  She saw Almyra Deforest, [place] in January 2021.  She lives at [facility].  She has been staying in due to the COVID-19 pandemic.  At the time she did not have edema.    I  saw her on June 16, 2019.  She was to undergo a food challenge to red meat in light of her alpha gal reported allergy; however, this was not done.  Recently, she had noticed some increasing exertional shortness of breath with decreasing activity.  Retrospectively the may have been ongoing for approximately 6 months.  She also states that she may be experiencing some mild chest tightness when she gets short of breath.  She has not had recent laboratory.  During that evaluation, her blood pressure was labile and I recommended further titration of amlodipine to 5 mg and slightly decrease Bystolic to 2.5 mg since when she had her allergy assessment the issue was raised about potential beta-blocker reducing the effect of EpiPen if she ever required use.  With her exertional dyspnea, I recommended a 2D echo Doppler study as well as a follow-up Lexiscan Myoview study to make certain these have not significantly changed from prior evaluation.  She underwent her [place] study on June 23, 2019.  This was a low risk study with nuclear stress EF at 53%.  There was no ischemia or infarction.  There was mild anterior attenuation artifact.  A 2D echo Doppler study was done on June 29, 2019 which showed an EF of 55 to 60% with grade 1 diastolic dysfunction.  There was mildly elevated pulmonary artery systolic pressure at 34.3.  There was no significant valvular  pathology.  I last saw her on follow-up on July 28, 2019.  At that time she felt improved with reference to her breathing with the medication adjustment. She had experienced several days of a transient sharp headache which ultimately resolved.  She denies chest pain PND orthopnea.  She was unaware of any arrhythmia.  Her blood pressure was stable and she had mild ankle edema.  She has a prescription for furosemide but had not taken that recently.  Since I last saw her, she is now living in [facility] in independent living.  Her husband has lung cancer and she has been under significant increased stress with him under hospice.  She has mild asthma.  She is followed by Dr. Brayton Mars in [facility].  She has asthma and is on Breo Ellipta Advair and Singulair.  Presently she also is on amlodipine 5 mg furosemide 20 mg as needed and nebivolol 2.5 mg daily in addition to spironolactone 25 mg.  She denies any chest pain.  She continues at times to note ankle swelling left greater than right.  She presents for follow-up evaluation.  Past Medical History:  Diagnosis Date   Abnormal liver function test    Alpha galactosidase deficiency    Arthritis    Asthma    Chest pain    a. 06/2013 Lexi MV: no ischemia/infarct;  b. 12/2015 MV ([place]): EF 82%, no ischemia.   DDD (degenerative disc disease), lumbar    DDD (degenerative disc disease), lumbosacral    Esophageal reflux    Hepatic steatosis    Mastodynia    Murmur    Osteopenia 03/2012   T score -1.4 FRAX 20%/11%; Dr Phineas Real   Other and unspecified hyperlipidemia    Personal history of colonic polyps 08/21/2006   ADENOMATOUS POLYP   Progressive Dyspnea    a. 06/2013 Echo: EF 55-60%, Gr1 DD;  b. 03/2014 CPX testing: indeterminate cv response due to suboptimal cv stress load and blunted chronotropic response. Nl pulm response, nl PFTs;  c. 12/2015 Echo [facility]): EF 55-60%, no significant valve dzs.   Spinal  stenosis    Unspecified asthma(493.90) 2003    no childhood asthma   Unspecified essential hypertension    Unspecified hypothyroidism     Past Surgical History:  Procedure Laterality Date   APPENDECTOMY     CHOLECYSTECTOMY  05/13/1971    for stones   MOUTH SURGERY Left 08/19/2020   done at [facility]  05/12/1966   [place] , [place]    Allergies  Allergen Reactions   Beef (Bovine) Protein Anaphylaxis    History of Alpha Gal : Hoff meat allergy.   Goat's Rue  [Galega] Anaphylaxis   Lambs Quarters Anaphylaxis   Other Anaphylaxis   Poractant Alfa Anaphylaxis   Pork Allergy Anaphylaxis   Pork-Derived Products Anaphylaxis   Cephalexin Other (See Comments)    Nausea, stomach upset, diarrhea   Phenergan [Promethazine Hcl] Nausea And Vomiting    Current Outpatient Medications  Medication Sig Dispense Refill   albuterol (PROAIR HFA) 108 (90 Base) MCG/ACT inhaler Inhale 2 puffs into the lungs every 4 (four) hours as needed for wheezing or shortness of breath. 18 g 1   albuterol (PROVENTIL) (2.5 MG/3ML) 0.083% nebulizer solution One ampule in nebulizer every 4 hours if needed for cough or wheeze. 75 mL 1   amLODipine (NORVASC) 5 MG tablet Take 1 tablet (5 mg total) by mouth daily. 90 tablet 3   betamethasone valerate ointment (VALISONE) 0.1 % Apply a pea sized amount topically BID for 2 weeks. 30 g 0   cetirizine (ZYRTEC) 10 MG tablet Take 10 mg by mouth as needed.     EPINEPHrine 0.3 mg/0.3 mL IJ SOAJ injection Use as directed for severe allergic reaction. 2 each 1   esomeprazole (NEXIUM) 40 MG capsule Take by mouth.     fluconazole (DIFLUCAN) 150 MG tablet Take one tab po and then repeat in 72 hours. 2 tablet 0   fluticasone (FLONASE) 50 MCG/ACT nasal spray Place 1 spray into both nostrils daily as needed. 16 g 5   fluticasone furoate-vilanterol (BREO ELLIPTA) 100-25 MCG/INH AEPB INHALE 1 PUFF INTO THE LUNGS BY MOUTH ONCE A DAY 1 each 5    Fluticasone-Salmeterol (ADVAIR) 250-50 MCG/DOSE AEPB Inhale 1 puff into the lungs daily as needed (if asthma flare).     furosemide (LASIX) 20 MG tablet Take one 76m tablet by mouth daily as needed for edema, 90 tablet 1   hydrOXYzine (ATARAX) 10 MG tablet TAKE 1 TABLET BY MOUTH THREE TIMES DAILY AS NEEDED FOR ANXIETY,  LIMIT USE     levothyroxine (SYNTHROID, LEVOTHROID) 88 MCG tablet Take by mouth.     mirtazapine (REMERON) 15 MG tablet Take 15 mg by mouth at bedtime.     montelukast (SINGULAIR) 10 MG tablet Take 1 tablet (10 mg total) by mouth at bedtime. 30 tablet 5   nebivolol (BYSTOLIC) 2.5 MG tablet Take 1 tablet (2.5 mg total) by mouth daily. 90 tablet 3   nitroGLYCERIN (NITROSTAT) 0.4 MG SL tablet Place 1 tablet (0.4 mg total) under the tongue every 15 (fifteen) minutes as needed for chest pain. 90 tablet 3   rOPINIRole (REQUIP) 0.25 MG tablet Take 1 tablet (0.25 mg total) by mouth at bedtime. 1 pill 2 hrs pre bedtime 30 tablet 2   rosuvastatin (CRESTOR) 5 MG tablet Take 1 tablet (5 mg total) by mouth at bedtime. 90 tablet 3   spironolactone (ALDACTONE) 25 MG tablet Take 1 tablet (25 mg total) by mouth daily. NEED OV. 90 tablet 1   sulfamethoxazole-trimethoprim (BACTRIM DS) 800-160 MG tablet Take 1 tablet by mouth 2 (two) times daily. 6 tablet 0   No current facility-administered medications for this visit.    Socially she is married has 2 children one grandchild. There is no tobacco history. She does drink alcohol occasionally.  Family History  Problem Relation Age of Onset   Uterine cancer Mother    Ovarian cancer Mother    Eczema Mother    Heart attack Father 4   Prostate cancer Father    Breast cancer Sister        x 3 sisters   Asthma Sister    Food Allergy Sister    Alcohol abuse Brother        x 3   Prostate cancer Brother        x 3 brothers   Pancreatic cancer Brother        x 2   Diabetes Neg Hx    Stroke Neg Hx    Allergic rhinitis Neg Hx    Urticaria Neg Hx     ROS General: Negative; No fevers, chills, or night sweats;  HEENT: Negative; No changes in vision or hearing, sinus congestion, difficulty swallowing Pulmonary: Positive for asthma Cardiovascular: Negative; No chest pain, presyncope, syncope, palpitations GI: Positive for GERD GU: Negative; No dysuria, hematuria, or difficulty voiding Musculoskeletal: Negative; no myalgias, joint pain, or weakness Hematologic/Oncology: Negative; no easy bruising, bleeding Endocrine: Positive for hypothyroidism Neuro: Positive for peripheral neuropathy Skin: Negative; No rashes or skin lesions Psychiatric: Negative; No behavioral problems, depression Sleep: Negative; No snoring, daytime sleepiness, hypersomnolence, bruxism, restless legs, hypnogognic hallucinations, no cataplexy   PE BP 130/60   Pulse 72   Ht 4' 10"  (1.473 m)   Wt 173 lb (78.5 kg)   SpO2 97%   BMI 36.16 kg/m    Repeat blood pressure by me was 124/64.  Wt Readings from Last 3 Encounters:  10/15/21 173 lb (78.5 kg)  12/18/20 180 lb (81.6 kg)  11/14/20 159 lb 6.4 oz (72.3 kg)   General: Alert, oriented, no distress.  Skin: normal turgor, no rashes, warm and dry HEENT: Normocephalic, atraumatic. Pupils equal round and reactive to light; sclera anicteric; extraocular muscles intact; Nose without nasal septal hypertrophy Mouth/Parynx benign; Mallinpatti scale 3 Neck: No JVD, no carotid bruits; normal carotid upstroke Lungs: clear to ausculatation and percussion; no wheezing or rales Chest wall: without tenderness to palpitation Heart: PMI not displaced, RRR, s1 s2 normal, 1/6 systolic murmur, no diastolic murmur, no  rubs, gallops, thrills, or heaves Abdomen: soft, nontender; no hepatosplenomehaly, BS+; abdominal aorta nontender and not dilated by palpation. Back: no CVA tenderness Pulses 2+ Musculoskeletal: full range of motion, normal strength, no joint deformities Extremities: Trace left ankle edema greater than right; no  clubbing cyanosis or edema, Homan's sign negative  Neurologic: grossly nonfocal; Cranial nerves grossly wnl Psychologic: Normal mood and affect    October 15, 2021 ECG (independently read by me): NSR at 72; no ectopy  July 28, 2019 ECG (independently read by me): Normal sinus rhythm at 70 bpm.  First-degree AV block with a parable of 216 ms.  No ectopy.  June 16, 2019 ECG (independently read by me): Sinus rhythm at 68 bpm.  Mild first-degree block. No ectopy  December 2019 ECG (independently read by me): Normal sinus rhythm at 71 bpm.  Borderline first-degree block with appeared over 204 ms.  Nonspecific ST changes.  Ectopy.  June 2019 ECG (independently read by me): Sinus rhythm with first-degree AV block.  Heart rate 64 bpm.  PR interval 212 ms.  October 2016 ECG (independently read by me): Normal  inus rhythm at 80 bpm with one isolated PVC.  QTc interval 435 ms.  PR duration 192 ms  March 2015 Last ECG (independently read by me): Normal sinus rhythm at 64 beats per minute. Normal intervals. No evidence for either an S wave in I or Q in 3.  Prior ECG of 06/08/2013 (independently read by me): Sinus rhythm at 78 beats per minute. No significant ST changes. Normal intervals.  LABS:.    Latest Ref Rng & Units 08/21/2019   10:35 PM 06/23/2019    8:21 AM 11/05/2017    8:57 AM  BMP  Glucose 70 - 99 mg/dL 111  95  91   BUN 8 - 23 mg/dL 21  14  18    Creatinine 0.44 - 1.00 mg/dL 1.09  0.89  0.76   BUN/Creat Ratio 12 - 28  16  24    Sodium 135 - 145 mmol/L 138  143  144   Potassium 3.5 - 5.1 mmol/L 4.2  4.5  5.2   Chloride 98 - 111 mmol/L 101  104  105   CO2 22 - 32 mmol/L 26  22  24    Calcium 8.9 - 10.3 mg/dL 9.2  9.3  9.6       Latest Ref Rng & Units 08/21/2019   10:35 PM 06/23/2019    8:21 AM 11/05/2017    8:57 AM  Hepatic Function  Total Protein 6.5 - 8.1 g/dL 7.7  6.5  6.3   Albumin 3.5 - 5.0 g/dL 4.2  4.1  4.0   AST 15 - 41 U/L 21  18  25    ALT 0 - 44 U/L 17  10  14    Alk  Phosphatase 38 - 126 U/L 78  80  81   Total Bilirubin 0.3 - 1.2 mg/dL 0.3  0.4  0.3       Latest Ref Rng & Units 08/21/2019   10:35 PM 06/23/2019    8:21 AM 11/05/2017    8:57 AM  CBC  WBC 4.0 - 10.5 K/uL 11.1  7.7  7.0   Hemoglobin 12.0 - 15.0 g/dL 14.6  13.8  13.5   Hematocrit 36.0 - 46.0 % 44.4  40.1  40.7   Platelets 150 - 400 K/uL 220  211  246    Lab Results  Component Value Date   MCV 96.9 08/21/2019   MCV  93 06/23/2019   MCV 94 11/05/2017   Lab Results  Component Value Date   TSH 1.210 06/23/2019   Lab Results  Component Value Date   HGBA1C 6.1 04/27/2012   Lipid Panel     Component Value Date/Time   CHOL 183 06/23/2019 0821   TRIG 140 06/23/2019 0821   HDL 71 06/23/2019 0821   CHOLHDL 2.6 06/23/2019 0821   CHOLHDL 4 05/17/2014 0834   VLDL 26.6 05/17/2014 0834   LDLCALC 88 06/23/2019 0821   LDLDIRECT 106.8 01/10/2011 1218      RADIOLOGY: No results found.  IMPRESSION: 1. Essential hypertension   2. Hyperlipidemia LDL goal <100   3. Mild intermittent asthma without complication   4. Hypothyroidism, unspecified type   5. Ankle swelling, left     ASSESSMENT AND PLAN: Ms. Coull is an 88 year-old female who has a long-standing history of asthma and in the past developed increasing shortness of breath. She has noticed several episodes of significant chest pressure and on one instance associated with left arm radiation. When I initially saw her I added low-dose amlodipine at 2.5 mg in light of this chest pressure to induced vasodilation and also added a very low dose cardioselective beta-blockade with bystolic 2.5 mg and suggested that she initiate a baby aspirin 81 mg.  Prior testing demonstrated a normal myocardial perfusion scan and echo Doppler study did show normal systolic function with grade 1 diastolic dysfunction. A cardiopulmonary met test showed fairly normal maximum oxygen consumption. However, the test was somewhat indeterminate due to to her inability  to achieve a maximum heart rate. She was only to able to achieve a heart rate of 103 which was submaximal. Her heart rate response was somewhat blunted. I suspected much of her shortness of breath most likely contributed by reduced aerobic capacity.  Pulmonary function studies were normal.  When last seen in February 2020 she was experiencing increased episodes of shortness of breath with less activity and also complained of vague sensation of chest tightness.  A subsequent Lexiscan Myoview study was low risk with a EF at 53% and showed mild breast attenuation without scar or ischemia.  An echo Doppler study continued to show normal LV function.  Presently she is doing well without chest pain or significant exertional dyspnea.  She is on medications for asthma including her Breo Ellipta, montelukast, and Advair.  Presently on exam there is no wheezing.  She does have some ankle swelling left greater than right and has a prescription for furosemide 20 mg as needed.  Her blood pressure is well controlled presently at 124/64 when rechecked by me on amlodipine 5 mg, low-dose nebivolol 2.5 mg daily and spironolactone 25 mg.  She continues to be on low-dose rosuvastatin 5 mg at bedtime.  I am recommending follow-up laboratory with a comprehensive metabolic panel, CBC, TSH and fasting lipid studies.  She is followed by Dr. Camillo Flaming in [facility].  I will contact her regarding her laboratory results and see her back in 1 year for reevaluation.    Troy Sine, MD, [facility] 10/28/2021 2:01 PM

## 2021-10-28 ENCOUNTER — Encounter: Payer: Self-pay | Admitting: Cardiovascular Disease

## 2021-10-28 LAB — COMPREHENSIVE METABOLIC PANEL WITH GFR
ALT: 14 IU/L (ref 0–32)
AST: 23 IU/L (ref 0–40)
Albumin/Globulin Ratio: 1.9 (ref 1.2–2.2)
Albumin: 4 g/dL (ref 3.5–4.6)
Alkaline Phosphatase: 91 IU/L (ref 44–121)
BUN/Creatinine Ratio: 16 (ref 12–28)
BUN: 14 mg/dL (ref 10–36)
Bilirubin Total: 0.3 mg/dL (ref 0.0–1.2)
CO2: 23 mmol/L (ref 20–29)
Calcium: 9.7 mg/dL (ref 8.7–10.3)
Chloride: 104 mmol/L (ref 96–106)
Creatinine, Ser: 0.9 mg/dL (ref 0.57–1.00)
Globulin, Total: 2.1 g/dL (ref 1.5–4.5)
Glucose: 88 mg/dL (ref 70–99)
Potassium: 4.6 mmol/L (ref 3.5–5.2)
Sodium: 140 mmol/L (ref 134–144)
Total Protein: 6.1 g/dL (ref 6.0–8.5)
eGFR: 61 mL/min/1.73 (ref >59)

## 2021-10-28 LAB — LIPID PANEL
Chol/HDL Ratio: 2.8 ratio (ref 0.0–4.4)
Cholesterol, Total: 186 mg/dL (ref 100–199)
HDL: 66 mg/dL (ref >39)
LDL Chol Calc (NIH): 92 mg/dL (ref 0–99)
Triglycerides: 162 mg/dL — ABNORMAL HIGH (ref 0–149)
VLDL Cholesterol Cal: 28 mg/dL (ref 5–40)

## 2021-10-28 LAB — CBC
Hematocrit: 37.5 % (ref 34.0–46.6)
Hemoglobin: 12.6 g/dL (ref 11.1–15.9)
MCH: 30.2 pg (ref 26.6–33.0)
MCHC: 33.6 g/dL (ref 31.5–35.7)
MCV: 90 fL (ref 79–97)
Platelets: 246 10*3/uL (ref 150–450)
RBC: 4.17 x10E6/uL (ref 3.77–5.28)
RDW: 14 % (ref 11.7–15.4)
WBC: 5.9 10*3/uL (ref 3.4–10.8)

## 2021-10-28 LAB — TSH: TSH: 0.712 u[IU]/mL (ref 0.450–4.500)

## 2021-11-04 ENCOUNTER — Other Ambulatory Visit: Payer: Self-pay | Admitting: Physician Assistant

## 2021-11-05 ENCOUNTER — Telehealth: Payer: Self-pay | Admitting: Cardiovascular Disease

## 2021-11-11 ENCOUNTER — Other Ambulatory Visit: Payer: Self-pay

## 2021-11-11 ENCOUNTER — Telehealth: Payer: Self-pay | Admitting: Cardiovascular Disease

## 2021-11-11 MED ORDER — NEBIVOLOL HCL 2.5 MG PO TABS: 2.5000 mg | ORAL_TABLET | Freq: Every day | ORAL | 3 refills | Status: DC

## 2021-11-11 NOTE — Telephone Encounter (Signed)
 *  STAT* If patient is at the pharmacy, call can be transferred to refill team.   1. Which medications need to be refilled? (please list name of each medication and dose if known) nebivolol (BYSTOLIC) 2.5 MG tablet  2. Which pharmacy/location (including street and city if local pharmacy) is medication to be sent to? [facility] # [place], [place]  3. Do they need a 30 day or 90 day supply? 90 days.  Pt been out of meds for 9 days. Needs refill today

## 2021-11-11 NOTE — Telephone Encounter (Signed)
Medication refilled and sent to desired pharmacy ?

## 2021-11-14 ENCOUNTER — Other Ambulatory Visit: Payer: Self-pay

## 2021-11-14 MED ORDER — ROSUVASTATIN CALCIUM 10 MG PO TABS: 10.0000 mg | ORAL_TABLET | Freq: Every day | ORAL | 3 refills | Status: DC

## 2021-11-15 ENCOUNTER — Telehealth: Payer: Self-pay | Admitting: Cardiovascular Disease

## 2021-11-15 NOTE — Telephone Encounter (Signed)
Pt states she is returning a call from our office. I did not see a phone note. Please advise.

## 2021-11-15 NOTE — Telephone Encounter (Signed)
Caprice Beaver, LPN  06/17/886  3:58 AM EDT Back to Top    Called patient, gave results.  Patient verbalized understanding to increase Rosuvastatin- will updated medication and send to pharmacy.    Meryl Crutch, RN  11/11/2021  4:13 PM EDT     Left message to call back.   Troy Sine, MD  11/11/2021 11:31 AM EDT     Lipid studies reveal triglycerides slightly elevated at 162.  LDL 92.  If she is on rosuvastatin 5 mg consider increasing to 10 mg.  Chemistry stable; CBC normal; TSH normal   Patient called and reviewed with her the only new note/change per cardiology.

## 2022-03-10 ENCOUNTER — Other Ambulatory Visit: Payer: Self-pay | Admitting: Cardiovascular Disease

## 2022-10-24 ENCOUNTER — Other Ambulatory Visit: Payer: Self-pay | Admitting: Cardiovascular Disease

## 2022-10-29 ENCOUNTER — Other Ambulatory Visit: Payer: Self-pay | Admitting: Cardiovascular Disease

## 2022-11-25 ENCOUNTER — Other Ambulatory Visit: Payer: Self-pay | Admitting: Cardiovascular Disease

## 2022-12-08 ENCOUNTER — Other Ambulatory Visit: Payer: Self-pay | Admitting: Cardiovascular Disease

## 2022-12-11 ENCOUNTER — Other Ambulatory Visit: Payer: Self-pay | Admitting: Cardiovascular Disease

## 2022-12-20 NOTE — Progress Notes (Unsigned)
Cardiology Clinic Note   Date: 12/22/2022 ID: GLENDI TOTZKE, DOB 05-11-1929, MRN [id number]  Primary Cardiologist:  Nicki Guadalajara, MD  Patient Profile    Mary Roth is a 89 y.o. female who presents to the clinic today for routine follow up.     Past medical history significant for: Chronic dyspnea/chest pain. Nuclear stress test 11/29/2019: Low risk study.  No perfusion defects suggestive of ischemia or infarct.  Mild anterior defect suggestive of breast attenuation artifact. Echo 06/29/2019: EF 55 to 60%.  Grade I DD.  Normal RV function.  Mildly elevated PA pressure.  No significant valvular abnormalities. Palpitations. Hypertension. Hyperlipidemia. Lipid panel 10/28/2021: LDL 92, HDL 66, TG 162.  Total 186. Asthma. GERD. Oral cancer. CKD stage III.     History of Present Illness    Mary Roth is a former patient of Dr. Alanda Amass.  She was first evaluated by Dr. Tresa Endo on 06/08/2013 for chest pain at the request of Dr. Alwyn Ren.  She is followed annually by Dr. Tresa Endo for the above outlined history.  Patient was last seen in the office by Dr. Tresa Endo on 10/15/2021 for routine follow-up.  At that time she was residing in independent living at [facility].  She was under increased stress secondary to her husband having lung cancer and being under hospice care.  She was doing well at that time.  Routine labs were drawn and her lipid panel showed an increase in LDL.  Crestor was increased to 10 mg.  Today, patient is accompanied by her daughter. She reports increased dyspnea with walking over the last month. She states more dyspneic with walking to the dining room at her living facility, walking around the grocery store or when she is tidying up her apartment. She does not feel this dyspnea is related to her asthma. She also reports edema of left ankle (history of breaking her left foot) that is normal in the morning and increases throughout the day. She has been told  to wear compression socks but has not been doing so. It is difficult for her to restrict sodium in her diet, as [place] provides her meals and her daughter feels the food is very salty. She does not add salt usually. She denies chest pain, orthopnea or PND. No palpitations.     ROS: All other systems reviewed and are otherwise negative except as noted in History of Present Illness.  Studies Reviewed    EKG Interpretation Date/Time:  Monday December 22 2022 11:13:38 EDT Ventricular Rate:  66 PR Interval:    QRS Duration:  72 QT Interval:  410 QTC Calculation: 429 R Axis:   0  Text Interpretation: Normal sinus rhythm When compared with ECG of 12-Sep-2003 07:50, No signigicant changes Confirmed by Carlos Levering [phone]) on 12/22/2022 11:30:52 AM           Physical Exam    VS:  BP 126/60   Pulse 69   Ht 4\' 10"  (1.473 m)   Wt 174 lb (78.9 kg)   SpO2 97%   BMI 36.37 kg/m  , BMI Body mass index is 36.37 kg/m.  GEN: Well nourished, well developed, in no acute distress. Neck: No JVD or carotid bruits. Cardiac:  RRR. No murmurs. No rubs or gallops.   Respiratory:  Respirations regular and unlabored. Clear to auscultation without rales, wheezing or rhonchi. GI: Soft, nontender, nondistended. Extremities: Radials/DP/PT 2+ and equal bilaterally. No clubbing or cyanosis. Mild, nonpitting edema left ankle.   Skin:  Warm and dry, no rash. Neuro: Strength intact.  Assessment & Plan     Chronic dyspnea/asthma.  Echo February 2021 showed normal LV/RV function, Grade I DD.  Patient is followed by Dr. Su Monks in [facility] for her asthma. Patient reports a 1 month history of increased dyspnea with exertion. She becomes dyspneic with walking to the dining room at her living facility, walking around the grocery store or tidying up her home. No orthopnea or PND. Will get an echo for further evaluation. Continue inhalers as recommended by pulmonology. Hypertension: BP today 126/60. Patient denies  headaches, dizziness or vision changes. Continue amlodipine, Bystolic, spironolactone. Chronic lower extremity edema. Patient reports left ankle edema that is best in the morning and increases throughout the day. She has a history of breaking her left foot and ankle has always been swollen. No orthopnea or PND. She has not been wearing compression socks. She has mild, nonpitting edema left ankle. Discussed wearing compression socks while around the house if she does not want to wear them when she goes out. She verbalized understanding.  Continue spironolactone and as needed Lasix. Hyperlipidemia.  LDL June 2023 92.  Continue rosuvastatin. Will get labs from PCP.   Disposition: Echo for DOE. Return in 1 year or sooner as needed.          Signed, Etta Grandchild. , DNP, NP-C

## 2022-12-22 ENCOUNTER — Ambulatory Visit: Payer: Medicare Other | Admitting: Student

## 2022-12-22 ENCOUNTER — Encounter: Payer: Self-pay | Admitting: Student

## 2022-12-22 VITALS — BP 126/60 | HR 69 | Ht <= 58 in | Wt 174.0 lb

## 2022-12-22 DIAGNOSIS — R0609 Other forms of dyspnea: Secondary | ICD-10-CM

## 2022-12-22 DIAGNOSIS — R600 Localized edema: Secondary | ICD-10-CM | POA: Diagnosis not present

## 2022-12-22 DIAGNOSIS — E785 Hyperlipidemia, unspecified: Secondary | ICD-10-CM

## 2022-12-22 MED ORDER — AMLODIPINE BESYLATE 5 MG PO TABS: 5.0000 mg | ORAL_TABLET | Freq: Every day | ORAL | 3 refills | Status: AC

## 2022-12-22 MED ORDER — FUROSEMIDE 20 MG PO TABS: ORAL_TABLET | ORAL | 3 refills | Status: AC

## 2022-12-22 MED ORDER — ROSUVASTATIN CALCIUM 10 MG PO TABS: 10.0000 mg | ORAL_TABLET | Freq: Every day | ORAL | 3 refills | Status: AC

## 2022-12-22 MED ORDER — NEBIVOLOL HCL 2.5 MG PO TABS: 2.5000 mg | ORAL_TABLET | Freq: Every day | ORAL | 3 refills | Status: AC

## 2022-12-22 MED ORDER — SPIRONOLACTONE 25 MG PO TABS: 25.0000 mg | ORAL_TABLET | Freq: Every day | ORAL | 3 refills | Status: DC

## 2022-12-22 NOTE — Patient Instructions (Signed)
Medication Instructions:  Your refill request have been sent to your local pharmacy. *If you need a refill on your cardiac medications before your next appointment, please call your pharmacy*   Lab Work: none If you have labs (blood work) drawn today and your tests are completely normal, you will receive your results only by: MyChart Message (if you have MyChart) OR A paper copy in the mail If you have any lab test that is abnormal or we need to change your treatment, we will call you to review the results.   Testing/Procedures: none   Follow-Up: At [facility], you and your health needs are our priority.  As part of our continuing mission to provide you with exceptional heart care, we have created designated Provider Care Teams.  These Care Teams include your primary Cardiologist (physician) and Advanced Practice Providers (APPs -  Physician Assistants and Nurse Practitioners) who all work together to provide you with the care you need, when you need it.  We recommend signing up for the patient portal called "MyChart".  Sign up information is provided on this After Visit Summary.  MyChart is used to connect with patients for Virtual Visits (Telemedicine).  Patients are able to view lab/test results, encounter notes, upcoming appointments, etc.  Non-urgent messages can be sent to your provider as well.   To learn more about what you can do with MyChart, go to [email or web address].    Your next appointment:   1 year(s)  Provider:   Nicki Guadalajara, MD

## 2023-04-01 NOTE — Progress Notes (Unsigned)
Cardiology Office Note    Patient Name: Mary Roth Date of Encounter: 04/01/2023  Primary Care Provider:  Henri Medal, MD Primary Cardiologist:  Nicki Guadalajara, MD Primary Electrophysiologist: None   Past Medical History    Past Medical History:  Diagnosis Date   Abnormal liver function test    Alpha galactosidase deficiency    Arthritis    Asthma    Chest pain    a. 06/2013 Lexi MV: no ischemia/infarct;  b. 12/2015 MV ([place]): EF 82%, no ischemia.   DDD (degenerative disc disease), lumbar    DDD (degenerative disc disease), lumbosacral    Esophageal reflux    Hepatic steatosis    Mastodynia    Murmur    Osteopenia 03/2012   T score -1.4 FRAX 20%/11%; Dr Audie Box   Other and unspecified hyperlipidemia    Personal history of colonic polyps 08/21/2006   ADENOMATOUS POLYP   Progressive Dyspnea    a. 06/2013 Echo: EF 55-60%, Gr1 DD;  b. 03/2014 CPX testing: indeterminate cv response due to suboptimal cv stress load and blunted chronotropic response. Nl pulm response, nl PFTs;  c. 12/2015 Echo [facility]): EF 55-60%, no significant valve dzs.   Spinal stenosis    Unspecified asthma(493.90) 2003    no childhood asthma   Unspecified essential hypertension    Unspecified hypothyroidism     History of Present Illness  Mary Roth is a 89 y.o. female with a PMH of chronic dyspnea, asthma, HLD, HTN, hypothyroidism, GERD, palpitations, childhood rheumatic fever with murmur who presents today for follow-up of chronic dyspnea.  Mary Roth was followed previously by Dr. Alanda Amass and is currently followed by Dr. Tresa Endo.  She has previous complaints of dyspnea secondary to asthma as well as chest pain.  She underwent nuclear perfusion study 06/2013 that was normal without evidence of ischemia.  She however continued to experience shortness of breath underwent 2D echo that showed normal RV systolic function with no elevated RA gradient.  She underwent a repeat Lexiscan Myoview that  was low risk with normal EF on 06/2019.  She was last seen by Dr. Tresa Endo on 10/15/2021 and noted swelling to her left ankle greater than right and blood pressure was well-controlled.  She was advised to use as needed Lasix for swelling.  She was last seen 12/22/2022 by Carlos Levering, NP and reported dyspnea that was unrelated to her asthma.  She underwent a 2D echo for further evaluation and advised to continue inhalers.  She was advised to use compression stockings and elevation for lower extremity swelling.  She completed a 2D echo on 08/2022 that showed EF of 55-60% with no significant valvular abnormalities.  She was seen recently by her PCP and reported ongoing shortness of breath with exertion.  She underwent Lexiscan Myoview for further evaluation on 11/14   Mary Roth presents with ongoing shortness of breath and occasional chest pain. The chest pain is described as minimal and transient, occurring on the right side, and is not associated with physical activity. The patient denies any reproducible chest pain with physical exertion. The patient also reports experiencing shortness of breath after walking long distances, such as going to meals or getting mail in her retirement home. The patient has been managing these symptoms by resting and listening to music. The patient also reports mild swelling in the ankles by nighttime. The patient's recent stress test showed a moderate wall defect, which has prompted further investigation.   Patient denies chest pain, palpitations,  PND, orthopnea, nausea, vomiting, dizziness, syncope, edema, weight gain, or early satiety.  Review of Systems  Please see the history of present illness.    All other systems reviewed and are otherwise negative except as noted above.  Physical Exam    Wt Readings from Last 3 Encounters:  12/22/22 174 lb (78.9 kg)  10/15/21 173 lb (78.5 kg)  12/18/20 180 lb (81.6 kg)   KG:MWNUU were no vitals filed for this visit.,There is no  height or weight on file to calculate BMI. GEN: Well nourished, well developed in no acute distress Neck: No JVD; No carotid bruits Pulmonary: Clear to auscultation without rales, wheezing or rhonchi occasional shortness of breath with exertion Cardiovascular: Normal rate. Regular rhythm. Normal S1. Normal S2.   Murmurs: There is no murmur.  ABDOMEN: Soft, non-tender, non-distended EXTREMITIES:  No edema; No deformity bilateral +1 edema  EKG/LABS/ Recent Cardiac Studies   ECG personally reviewed by me today -none completed today  Risk Assessment/Calculations:          Lab Results  Component Value Date   WBC 5.9 10/28/2021   HGB 12.6 10/28/2021   HCT 37.5 10/28/2021   MCV 90 10/28/2021   PLT 246 10/28/2021   Lab Results  Component Value Date   CREATININE 0.90 10/28/2021   BUN 14 10/28/2021   NA 140 10/28/2021   K 4.6 10/28/2021   CL 104 10/28/2021   CO2 23 10/28/2021   Lab Results  Component Value Date   CHOL 186 10/28/2021   HDL 66 10/28/2021   LDLCALC 92 10/28/2021   LDLDIRECT 106.8 01/10/2011   TRIG 162 (H) 10/28/2021   CHOLHDL 2.8 10/28/2021    Lab Results  Component Value Date   HGBA1C 6.1 04/27/2012   Assessment & Plan    1.  Abnormal Stress Test: Moderate wall defect noted on Lexiscan stress test. Discussed the possibility of artifact and the need for further testing to rule out ischemia. Normal systolic function and ejection fraction noted, suggesting the absence of a significant scar. -Order Coronary CT Angiography (CTA) to assess for coronary artery disease. -Check kidney function prior to CTA due to contrast dye use.  2.  Essential hypertension: -Patient's blood pressure today was well-controlled at 126/60 -Continue Norvasc 5 mg daily, Bystolic 2.5 mg daily, spironolactone 25 mg daily  3.  Hyperlipidemia: -Previous LDL was 92 -Continue Crestor 10 mg daily per PCP  4.  Shortness of Breath: Ongoing symptoms, possibly multifactorial due to cardiac  and pulmonary factors. Asthma may be contributing. -Advise to use rescue inhaler as needed for shortness of breath. -Encourage daily use of Breo Ellipta as maintenance therapy for asthma  5. Lower Extremity Edema Mild swelling noted in ankles, possibly due to venous insufficiency. -Advise use of compression socks to help reduce swelling. -May consider switching Norvasc for another antihypertensive medication in the future.    Disposition: Follow-up with Nicki Guadalajara, MD as scheduled  Signed, Napoleon Form, Leodis Rains, NP 04/01/2023, 10:34 AM [facility]

## 2023-04-02 ENCOUNTER — Ambulatory Visit: Payer: Medicare Other | Attending: Nurse Practitioner | Admitting: Nurse Practitioner

## 2023-04-02 ENCOUNTER — Other Ambulatory Visit: Payer: Self-pay

## 2023-04-02 ENCOUNTER — Encounter: Payer: Self-pay | Admitting: Nurse Practitioner

## 2023-04-02 VITALS — BP 126/60 | HR 70 | Ht <= 58 in | Wt 174.6 lb

## 2023-04-02 DIAGNOSIS — I10 Essential (primary) hypertension: Secondary | ICD-10-CM | POA: Diagnosis not present

## 2023-04-02 DIAGNOSIS — R600 Localized edema: Secondary | ICD-10-CM | POA: Diagnosis not present

## 2023-04-02 DIAGNOSIS — E785 Hyperlipidemia, unspecified: Secondary | ICD-10-CM

## 2023-04-02 DIAGNOSIS — R0609 Other forms of dyspnea: Secondary | ICD-10-CM | POA: Diagnosis not present

## 2023-04-02 NOTE — Patient Instructions (Addendum)
Medication Instructions:  Your physician recommends that you continue on your current medications as directed. Please refer to the Current Medication list given to you today. *If you need a refill on your cardiac medications before your next appointment, please call your pharmacy*   Lab Work: Once test is scheduled swing by the office and complete BMET If you have labs (blood work) drawn today and your tests are completely normal, you will receive your results only by: MyChart Message (if you have MyChart) OR A paper copy in the mail If you have any lab test that is abnormal or we need to change your treatment, we will call you to review the results.   Testing/Procedures: Coronary CTA    Follow-Up: At [facility], you and your health needs are our priority.  As part of our continuing mission to provide you with exceptional heart care, we have created designated Provider Care Teams.  These Care Teams include your primary Cardiologist (physician) and Advanced Practice Providers (APPs -  Physician Assistants and Nurse Practitioners) who all work together to provide you with the care you need, when you need it.  We recommend signing up for the patient portal called "MyChart".  Sign up information is provided on this After Visit Summary.  MyChart is used to connect with patients for Virtual Visits (Telemedicine).  Patients are able to view lab/test results, encounter notes, upcoming appointments, etc.  Non-urgent messages can be sent to your provider as well.   To learn more about what you can do with MyChart, go to [email or web address].    Your next appointment:   7 month(s) (JUNE 2025)  Provider:   Nicki Guadalajara, MD     Other Instructions:  Please get some ted hose or compression stockings. They can be purchased at your local medical supply store, [employer], [employer] or [employer] fund[employer].  Put them on first thing in the morning and wear them during the day . Elevate your feet  during the day and remove hose in the evening before bed.     Your cardiac CT will be scheduled at one of the below locations:   [facility] [address] [place], [place] [address] [phone]  If scheduled at [facility], please arrive at the [facility] Entrance (Entrance C2) of [facility] 30 minutes prior to test start time. You can use the FREE valet parking offered at entrance C (encouraged to control the heart rate for the test)  Proceed to the [facility] Radiology Department (first floor) to check-in and test prep.  All radiology patients and guests should use entrance C2 at [facility], accessed from [facility], even though the hospital's physical address listed is [address]      There is spacious parking and easy access to the radiology department from the [facility] Heart and Vascular entrance. Please enter here and check-in with the desk attendant.   Please follow these instructions carefully (unless otherwise directed):  An IV will be required for this test and Nitroglycerin will be given.  Hold all erectile dysfunction medications at least 3 days (72 hrs) prior to test. (Ie viagra, cialis, sildenafil, tadalafil, etc)   On the Night Before the Test: Be sure to Drink plenty of water. Do not consume any caffeinated/decaffeinated beverages or chocolate 12 hours prior to your test. Do not take any antihistamines 12 hours prior to your test.   On the Day of the Test: Drink plenty  of water until 1 hour prior to the test. Do not eat any food 1 hour prior to test. You may take your regular medications prior to the test.  Take Nebivolol (Bystolic) 1 hour prior to test and bring an extra tablet with you in case you need it . If you take Furosemide/Hydrochlorothiazide/Spironolactone, please HOLD on the morning of the test. FEMALES- please wear underwire-free bra if available, avoid dresses & tight  clothing      After the Test: Drink plenty of water. After receiving IV contrast, you may experience a mild flushed feeling. This is normal. On occasion, you may experience a mild rash up to 24 hours after the test. This is not dangerous. If this occurs, you can take Benadryl 25 mg and increase your fluid intake. If you experience trouble breathing, this can be serious. If it is severe call 911 IMMEDIATELY. If it is mild, please call our office. If you take any of these medications: Glipizide/Metformin, Avandament, Glucavance, please do not take 48 hours after completing test unless otherwise instructed.  We will call to schedule your test 2-4 weeks out understanding that some insurance companies will need an authorization prior to the service being performed.   For more information and frequently asked questions, please visit our website : [email or web address]  For non-scheduling related questions, please contact the cardiac imaging nurse navigator should you have any questions/concerns: Cardiac Imaging Nurse Navigators Direct Office Dial: [phone]   For scheduling needs, including cancellations and rescheduling, please call [place], [phone].

## 2023-04-17 ENCOUNTER — Telehealth (HOSPITAL_COMMUNITY): Payer: Self-pay | Admitting: *Deleted

## 2023-04-17 NOTE — Telephone Encounter (Signed)
Attempted to call patient regarding upcoming cardiac CT appointment. Left message on voicemail with name and callback number  Larey Brick RN Navigator Cardiac Imaging [facility] Heart and V[facility] Services [phone] Office [phone] Cell  Reminder to obtain labs.

## 2023-04-21 ENCOUNTER — Encounter (HOSPITAL_COMMUNITY): Payer: Self-pay

## 2023-04-21 ENCOUNTER — Ambulatory Visit (HOSPITAL_COMMUNITY)
Admission: RE | Admit: 2023-04-21 | Discharge: 2023-04-21 | Disposition: A | Discharge status: Home / Self Care | Payer: Medicare Other | Source: Ambulatory Visit | Attending: Nurse Practitioner | Admitting: Nurse Practitioner

## 2023-04-21 DIAGNOSIS — R0609 Other forms of dyspnea: Secondary | ICD-10-CM

## 2023-04-21 DIAGNOSIS — R600 Localized edema: Secondary | ICD-10-CM

## 2023-04-21 DIAGNOSIS — I10 Essential (primary) hypertension: Secondary | ICD-10-CM

## 2023-04-21 DIAGNOSIS — E785 Hyperlipidemia, unspecified: Secondary | ICD-10-CM

## 2023-04-21 LAB — BASIC METABOLIC PANEL
BUN/Creatinine Ratio: 21 (ref 12–28)
BUN: 18 mg/dL (ref 10–36)
CO2: 25 mmol/L (ref 20–29)
Calcium: 9.4 mg/dL (ref 8.7–10.3)
Chloride: 104 mmol/L (ref 96–106)
Creatinine, Ser: 0.87 mg/dL (ref 0.57–1.00)
Glucose: 91 mg/dL (ref 70–99)
Potassium: 5.1 mmol/L (ref 3.5–5.2)
Sodium: 140 mmol/L (ref 134–144)
eGFR: 62 mL/min/{1.73_m2} (ref >59)

## 2023-05-18 ENCOUNTER — Telehealth (HOSPITAL_COMMUNITY): Payer: Self-pay | Admitting: Emergency Medicine

## 2023-05-18 NOTE — Telephone Encounter (Signed)
 Reaching out to patient to offer assistance regarding upcoming cardiac imaging study; pt verbalizes understanding of appt date/time, parking situation and where to check in, pre-test NPO status and medications ordered, and verified current allergies; name and call back number provided for further questions should they arise Rockwell Alexandria RN Navigator Cardiac Imaging [facility] [phone] office [phone] cell

## 2023-05-19 ENCOUNTER — Ambulatory Visit (HOSPITAL_COMMUNITY): Admission: RE | Admit: 2023-05-19 | Payer: Medicare Other | Source: Ambulatory Visit

## 2023-05-27 ENCOUNTER — Encounter (HOSPITAL_COMMUNITY): Payer: Self-pay

## 2023-05-28 ENCOUNTER — Telehealth (HOSPITAL_COMMUNITY): Payer: Self-pay | Admitting: *Deleted

## 2023-05-28 NOTE — Telephone Encounter (Signed)
Reaching out to patient to offer assistance regarding upcoming cardiac imaging study; pt verbalizes understanding of appt date/time, parking situation and where to check in, pre-test NPO status and medications ordered, and verified current allergies; name and call back number provided for further questions should they arise Hayley Sharpe RN Navigator Cardiac Imaging [facility] [phone] office [phone] cell  

## 2023-05-29 ENCOUNTER — Ambulatory Visit (HOSPITAL_COMMUNITY)
Admission: RE | Admit: 2023-05-29 | Discharge: 2023-05-29 | Disposition: A | Discharge status: Home / Self Care | Payer: Medicare Other | Source: Ambulatory Visit | Attending: Nurse Practitioner | Admitting: Nurse Practitioner

## 2023-05-29 DIAGNOSIS — E785 Hyperlipidemia, unspecified: Secondary | ICD-10-CM | POA: Diagnosis present

## 2023-05-29 DIAGNOSIS — R600 Localized edema: Secondary | ICD-10-CM | POA: Insufficient documentation

## 2023-05-29 DIAGNOSIS — R9430 Abnormal result of cardiovascular function study, unspecified: Secondary | ICD-10-CM | POA: Diagnosis not present

## 2023-05-29 DIAGNOSIS — I3481 Nonrheumatic mitral (valve) annulus calcification: Secondary | ICD-10-CM | POA: Diagnosis not present

## 2023-05-29 DIAGNOSIS — R0609 Other forms of dyspnea: Secondary | ICD-10-CM | POA: Insufficient documentation

## 2023-05-29 DIAGNOSIS — I700 Atherosclerosis of aorta: Secondary | ICD-10-CM | POA: Insufficient documentation

## 2023-05-29 DIAGNOSIS — I10 Essential (primary) hypertension: Secondary | ICD-10-CM | POA: Diagnosis present

## 2023-05-29 MED ORDER — NITROGLYCERIN 0.4 MG SL SUBL
SUBLINGUAL_TABLET | SUBLINGUAL | Status: AC
  Filled 2023-05-29: qty 2

## 2023-05-29 MED ORDER — IOHEXOL 350 MG/ML SOLN
95.0000 mL | Freq: Once | INTRAVENOUS | Status: AC | PRN
  Administered 2023-05-29: 95 mL via INTRAVENOUS

## 2023-05-29 MED ORDER — NITROGLYCERIN 0.4 MG SL SUBL
0.8000 mg | SUBLINGUAL_TABLET | Freq: Once | SUBLINGUAL | Status: AC
  Administered 2023-05-29: 0.8 mg via SUBLINGUAL

## 2023-08-20 ENCOUNTER — Ambulatory Visit: Admitting: Obstetrics and Gynecology

## 2023-08-20 ENCOUNTER — Encounter: Payer: Self-pay | Admitting: Obstetrics and Gynecology

## 2023-08-20 VITALS — BP 124/76 | HR 75 | Temp 97.9°F | Wt 174.0 lb

## 2023-08-20 DIAGNOSIS — N763 Subacute and chronic vulvitis: Secondary | ICD-10-CM

## 2023-08-20 DIAGNOSIS — N3945 Continuous leakage: Secondary | ICD-10-CM | POA: Diagnosis not present

## 2023-08-20 DIAGNOSIS — N958 Other specified menopausal and perimenopausal disorders: Secondary | ICD-10-CM | POA: Diagnosis not present

## 2023-08-20 MED ORDER — CLOBETASOL PROPIONATE 0.05 % EX OINT: 1.0000 | TOPICAL_OINTMENT | Freq: Every day | CUTANEOUS | 0 refills | Status: AC

## 2023-08-20 MED ORDER — ESTRADIOL 0.1 MG/GM VA CREA: TOPICAL_CREAM | VAGINAL | 1 refills | Status: AC

## 2023-08-20 NOTE — Progress Notes (Signed)
 89 y.o. [id number] female here for vulvar irritation.  No LMP recorded. Patient is postmenopausal.    Urinary leaking all the time. Using valeline after showers. Vaginal itching, not going away. Denies discharge or odor.   Has been passing urine a half ago and she has not been taking care of herself. She lives at a retirement home. Her daughter does have lunch with her every day.   OB History  Gravida Para Term Preterm AB Living  2 2 2   2   SAB IAB Ectopic Multiple Live Births          # Outcome Date GA Lbr Len/2nd Weight Sex Type Anes PTL Lv  2 Term           1 Term             Past Medical History:  Diagnosis Date   Abnormal liver function test    Alpha galactosidase deficiency    Arthritis    Asthma    Chest pain    a. 06/2013 [facility] MV: no ischemia/infarct;  b. 12/2015 MV ([place]): EF 82%, no ischemia.   DDD (degenerative disc disease), lumbar    DDD (degenerative disc disease), lumbosacral    Esophageal reflux    Hepatic steatosis    Mastodynia    Murmur    Osteopenia 03/2012   T score -1.4 FRAX 20%/11%; Dr Audie Box   Other and unspecified hyperlipidemia    Personal history of colonic polyps 08/21/2006   ADENOMATOUS POLYP   Progressive Dyspnea    a. 06/2013 Echo: EF 55-60%, Gr1 DD;  b. 03/2014 CPX testing: indeterminate cv response due to suboptimal cv stress load and blunted chronotropic response. Nl pulm response, nl PFTs;  c. 12/2015 Echo [facility]): EF 55-60%, no significant valve dzs.   Spinal stenosis    Unspecified asthma(493.90) 2003    no childhood asthma   Unspecified essential hypertension    Unspecified hypothyroidism     Past Surgical History:  Procedure Laterality Date   APPENDECTOMY     CHOLECYSTECTOMY  05/13/1971    for stones   MOUTH SURGERY Left 08/19/2020   done at [facility]   TONSILLECTOMY     UTERINE SUSPENSION  05/12/1966   [place] , [place]    Current Outpatient Medications on File Prior to Visit  Medication Sig Dispense Refill    albuterol (PROAIR HFA) 108 (90 Base) MCG/ACT inhaler Inhale 2 puffs into the lungs every 4 (four) hours as needed for wheezing or shortness of breath. 18 g 1   albuterol (PROVENTIL) (2.5 MG/3ML) 0.083% nebulizer solution One ampule in nebulizer every 4 hours if needed for cough or wheeze. 75 mL 1   amLODipine (NORVASC) 5 MG tablet Take 1 tablet (5 mg total) by mouth daily. 90 tablet 3   cetirizine (ZYRTEC) 10 MG tablet Take 10 mg by mouth as needed.     EPINEPHrine 0.3 mg/0.3 mL IJ SOAJ injection Use as directed for severe allergic reaction. 2 each 1   esomeprazole (NEXIUM) 40 MG capsule Take by mouth.     fluticasone (FLONASE) 50 MCG/ACT nasal spray Place 1 spray into both nostrils daily as needed. 16 g 5   fluticasone furoate-vilanterol (BREO ELLIPTA) 100-25 MCG/INH AEPB INHALE 1 PUFF INTO THE LUNGS BY MOUTH ONCE A DAY 1 each 5   Fluticasone-Salmeterol (ADVAIR) 250-50 MCG/DOSE AEPB Inhale 1 puff into the lungs daily as needed (if asthma flare).     furosemide (LASIX) 20 MG tablet Take one 20mg   tablet by mouth daily as needed for edema, 90 tablet 3   levothyroxine (SYNTHROID, LEVOTHROID) 88 MCG tablet Take by mouth.     montelukast (SINGULAIR) 10 MG tablet Take 1 tablet (10 mg total) by mouth at bedtime. 30 tablet 5   nebivolol (BYSTOLIC) 2.5 MG tablet Take 1 tablet (2.5 mg total) by mouth daily. 90 tablet 3   rOPINIRole (REQUIP) 0.25 MG tablet Take 1 tablet (0.25 mg total) by mouth at bedtime. 1 pill 2 hrs pre bedtime 30 tablet 2   rosuvastatin (CRESTOR) 10 MG tablet Take 1 tablet (10 mg total) by mouth at bedtime. 90 tablet 3   spironolactone (ALDACTONE) 25 MG tablet Take 1 tablet (25 mg total) by mouth daily. 90 tablet 3   nitroGLYCERIN (NITROSTAT) 0.4 MG SL tablet Place 1 tablet (0.4 mg total) under the tongue every 15 (fifteen) minutes as needed for chest pain. 90 tablet 3   No current facility-administered medications on file prior to visit.    Allergies  Allergen Reactions   Beef  (Bovine) Protein Anaphylaxis    History of Alpha Gal : Hoff meat allergy.   Goat's Rue  [Galega] Anaphylaxis   Lambs Quarters Anaphylaxis   Other Anaphylaxis   Poractant Alfa Anaphylaxis   Pork Allergy Anaphylaxis   Pork-Derived Products Anaphylaxis   Cephalexin Other (See Comments)    Nausea, stomach upset, diarrhea   Phenergan [Promethazine Hcl] Nausea And Vomiting      PE Today's Vitals   08/20/23 1603  BP: 124/76  Pulse: 75  Temp: 97.9 F (36.6 C)  TempSrc: Oral  SpO2: 97%  Weight: 174 lb (78.9 kg)   Body mass index is 36.37 kg/m.  Physical Exam Vitals reviewed. Exam conducted with a chaperone present.  Constitutional:      General: She is not in acute distress.    Appearance: Normal appearance.  HENT:     Head: Normocephalic and atraumatic.     Nose: Nose normal.  Eyes:     Extraocular Movements: Extraocular movements intact.     Conjunctiva/sclera: Conjunctivae normal.  Pulmonary:     Effort: Pulmonary effort is normal.  Genitourinary:    Comments: Red vulvar rash  Fusion of vulva at midline, unable to visualize urethra, pin size vaginal opening Musculoskeletal:        General: Normal range of motion.     Cervical back: Normal range of motion.  Neurological:     General: No focal deficit present.     Mental Status: She is alert.  Psychiatric:        Mood and Affect: Mood normal.        Behavior: Behavior normal.      Assessment and Plan:        Genitourinary syndrome of menopause -     Estradiol; Apply 1/2 gram to vulva nightly for 4 weeks then decrease to 1/2 gram to vulva two nights a week. Do not use applicator.  Dispense: 42.5 g; Refill: 1  Chronic vulvitis -     Clobetasol Propionate; Apply 1 Application topically daily for 14 days. Then use as needed  Dispense: 60 g; Refill: 0  Continuous leakage of urine  Pelvic floor PT at her retirement home.  Patient will start, reassess in 4 weeks.  Return office in 4 weeks   Rosalyn Gess,  MD

## 2023-08-21 ENCOUNTER — Ambulatory Visit: Admitting: Obstetrics and Gynecology

## 2023-09-17 ENCOUNTER — Ambulatory Visit: Admitting: Obstetrics and Gynecology

## 2023-10-14 ENCOUNTER — Other Ambulatory Visit: Payer: Self-pay | Admitting: Student

## 2024-03-09 ENCOUNTER — Telehealth: Payer: Self-pay | Admitting: Cardiovascular Disease

## 2024-03-09 NOTE — Telephone Encounter (Signed)
 Caller Adina) stated patient told them she would need to be pre-medication prior to her dental cleaning.  Caller noted patient has appointment scheduled on 12/3 and wants advice on next steps.

## 2024-03-10 NOTE — Telephone Encounter (Signed)
 Spoke with pt and cathy, no need for pre-med prior to dental procedures. This note faxed to [phone] at cathy's request.

## 2024-04-14 ENCOUNTER — Ambulatory Visit: Admitting: Cardiology
